# Patient Record
Sex: Male | Born: 1935 | Race: Black or African American | Hispanic: No | Smoking: Never smoker
Health system: Southern US, Community
[De-identification: ages and names within clinical notes are randomized; demographics above are authoritative.]

## PROBLEM LIST (undated history)

## (undated) DIAGNOSIS — F0390 Unspecified dementia without behavioral disturbance: Secondary | ICD-10-CM

## (undated) DIAGNOSIS — D649 Anemia, unspecified: Secondary | ICD-10-CM

## (undated) DIAGNOSIS — E0580 Other thyrotoxicosis without thyrotoxic crisis or storm: Secondary | ICD-10-CM

## (undated) DIAGNOSIS — K219 Gastro-esophageal reflux disease without esophagitis: Secondary | ICD-10-CM

## (undated) DIAGNOSIS — I639 Cerebral infarction, unspecified: Secondary | ICD-10-CM

## (undated) DIAGNOSIS — I2510 Atherosclerotic heart disease of native coronary artery without angina pectoris: Secondary | ICD-10-CM

## (undated) DIAGNOSIS — E782 Mixed hyperlipidemia: Secondary | ICD-10-CM

## (undated) DIAGNOSIS — N189 Chronic kidney disease, unspecified: Secondary | ICD-10-CM

## (undated) DIAGNOSIS — I959 Hypotension, unspecified: Secondary | ICD-10-CM

## (undated) DIAGNOSIS — N186 End stage renal disease: Secondary | ICD-10-CM

## (undated) DIAGNOSIS — F419 Anxiety disorder, unspecified: Secondary | ICD-10-CM

## (undated) DIAGNOSIS — F29 Unspecified psychosis not due to a substance or known physiological condition: Secondary | ICD-10-CM

## (undated) DIAGNOSIS — I10 Essential (primary) hypertension: Secondary | ICD-10-CM

## (undated) DIAGNOSIS — Z862 Personal history of diseases of the blood and blood-forming organs and certain disorders involving the immune mechanism: Secondary | ICD-10-CM

## (undated) DIAGNOSIS — M1990 Unspecified osteoarthritis, unspecified site: Secondary | ICD-10-CM

## (undated) DIAGNOSIS — H269 Unspecified cataract: Secondary | ICD-10-CM

## (undated) DIAGNOSIS — F22 Delusional disorders: Secondary | ICD-10-CM

## (undated) HISTORY — PX: OTHER SURGICAL HISTORY: SHX169

## (undated) HISTORY — DX: Chronic kidney disease, unspecified: N18.9

## (undated) HISTORY — DX: Essential (primary) hypertension: I10

## (undated) HISTORY — PX: FOOT SURGERY: SHX648

## (undated) HISTORY — DX: Personal history of diseases of the blood and blood-forming organs and certain disorders involving the immune mechanism: Z86.2

## (undated) HISTORY — DX: Unspecified dementia, unspecified severity, without behavioral disturbance, psychotic disturbance, mood disturbance, and anxiety: F03.90

## (undated) HISTORY — DX: Atherosclerotic heart disease of native coronary artery without angina pectoris: I25.10

## (undated) HISTORY — DX: Anxiety disorder, unspecified: F41.9

## (undated) HISTORY — DX: Unspecified osteoarthritis, unspecified site: M19.90

## (undated) HISTORY — DX: Gastro-esophageal reflux disease without esophagitis: K21.9

## (undated) HISTORY — DX: Anemia, unspecified: D64.9

## (undated) HISTORY — PX: KNEE SURGERY: SHX244

## (undated) HISTORY — DX: Other thyrotoxicosis without thyrotoxic crisis or storm: E05.80

## (undated) HISTORY — PX: BUNIONECTOMY: SHX129

---

## 2001-09-24 ENCOUNTER — Encounter: Payer: Self-pay | Admitting: Urology

## 2001-09-24 ENCOUNTER — Encounter: Admission: RE | Admit: 2001-09-24 | Discharge: 2001-09-24 | Payer: Self-pay | Admitting: Urology

## 2001-10-02 ENCOUNTER — Encounter: Payer: Self-pay | Admitting: Urology

## 2001-10-02 ENCOUNTER — Ambulatory Visit: Admission: RE | Admit: 2001-10-02 | Discharge: 2001-10-02 | Payer: Self-pay | Admitting: Urology

## 2003-07-10 ENCOUNTER — Encounter (INDEPENDENT_AMBULATORY_CARE_PROVIDER_SITE_OTHER): Payer: Self-pay | Admitting: *Deleted

## 2003-07-11 ENCOUNTER — Inpatient Hospital Stay (HOSPITAL_COMMUNITY): Admission: RE | Admit: 2003-07-11 | Discharge: 2003-07-12 | Payer: Self-pay | Admitting: Urology

## 2004-02-01 ENCOUNTER — Ambulatory Visit (HOSPITAL_COMMUNITY): Admission: RE | Admit: 2004-02-01 | Discharge: 2004-02-01 | Payer: Self-pay | Admitting: Urology

## 2004-02-01 ENCOUNTER — Ambulatory Visit (HOSPITAL_BASED_OUTPATIENT_CLINIC_OR_DEPARTMENT_OTHER): Admission: RE | Admit: 2004-02-01 | Discharge: 2004-02-01 | Payer: Self-pay | Admitting: Urology

## 2008-04-25 ENCOUNTER — Emergency Department: Payer: Self-pay | Admitting: Unknown Physician Specialty

## 2008-04-26 ENCOUNTER — Emergency Department: Payer: Self-pay | Admitting: Emergency Medicine

## 2008-04-27 ENCOUNTER — Emergency Department: Payer: Self-pay | Admitting: Emergency Medicine

## 2008-05-04 ENCOUNTER — Ambulatory Visit: Payer: Self-pay | Admitting: Nephrology

## 2009-07-22 ENCOUNTER — Inpatient Hospital Stay: Payer: Self-pay | Admitting: Internal Medicine

## 2009-08-03 ENCOUNTER — Emergency Department: Payer: Self-pay | Admitting: Emergency Medicine

## 2009-08-28 ENCOUNTER — Ambulatory Visit: Payer: Self-pay | Admitting: Gastroenterology

## 2009-10-09 ENCOUNTER — Ambulatory Visit: Payer: Self-pay | Admitting: Gastroenterology

## 2009-10-18 ENCOUNTER — Ambulatory Visit: Payer: Self-pay | Admitting: Nephrology

## 2009-12-04 ENCOUNTER — Ambulatory Visit: Payer: Self-pay | Admitting: Nephrology

## 2009-12-05 ENCOUNTER — Emergency Department: Payer: Self-pay | Admitting: Unknown Physician Specialty

## 2009-12-07 ENCOUNTER — Emergency Department: Payer: Self-pay | Admitting: Emergency Medicine

## 2010-03-04 ENCOUNTER — Emergency Department: Payer: Self-pay | Admitting: Emergency Medicine

## 2010-03-14 ENCOUNTER — Inpatient Hospital Stay: Payer: Self-pay | Admitting: Internal Medicine

## 2010-03-14 ENCOUNTER — Ambulatory Visit: Payer: Self-pay | Admitting: Cardiovascular Disease

## 2010-03-15 ENCOUNTER — Encounter: Payer: Self-pay | Admitting: Cardiovascular Disease

## 2010-03-15 ENCOUNTER — Encounter: Payer: Self-pay | Admitting: Internal Medicine

## 2010-03-20 ENCOUNTER — Encounter: Payer: Self-pay | Admitting: Internal Medicine

## 2010-03-21 ENCOUNTER — Emergency Department (HOSPITAL_COMMUNITY)
Admission: EM | Admit: 2010-03-21 | Discharge: 2010-03-21 | Payer: Self-pay | Source: Home / Self Care | Admitting: Emergency Medicine

## 2010-04-05 ENCOUNTER — Ambulatory Visit (HOSPITAL_COMMUNITY)
Admission: RE | Admit: 2010-04-05 | Discharge: 2010-04-05 | Payer: Self-pay | Source: Home / Self Care | Admitting: Nephrology

## 2010-04-22 ENCOUNTER — Ambulatory Visit: Payer: Self-pay | Admitting: Surgery

## 2010-05-01 ENCOUNTER — Ambulatory Visit (HOSPITAL_COMMUNITY)
Admission: RE | Admit: 2010-05-01 | Discharge: 2010-05-01 | Payer: Self-pay | Source: Home / Self Care | Attending: Vascular Surgery | Admitting: Vascular Surgery

## 2010-05-01 HISTORY — PX: ARTERIOVENOUS GRAFT PLACEMENT: SUR1029

## 2010-05-02 ENCOUNTER — Encounter: Payer: Self-pay | Admitting: Internal Medicine

## 2010-05-03 ENCOUNTER — Ambulatory Visit: Payer: Self-pay | Admitting: Vascular Surgery

## 2010-05-12 ENCOUNTER — Emergency Department (HOSPITAL_COMMUNITY)
Admission: EM | Admit: 2010-05-12 | Discharge: 2010-05-12 | Payer: Self-pay | Source: Home / Self Care | Admitting: Emergency Medicine

## 2010-05-17 ENCOUNTER — Ambulatory Visit: Payer: Self-pay | Admitting: Vascular Surgery

## 2010-05-22 ENCOUNTER — Encounter: Payer: Self-pay | Admitting: Internal Medicine

## 2010-06-07 ENCOUNTER — Ambulatory Visit
Admission: RE | Admit: 2010-06-07 | Discharge: 2010-06-07 | Payer: Self-pay | Source: Home / Self Care | Attending: Internal Medicine | Admitting: Internal Medicine

## 2010-06-07 ENCOUNTER — Encounter: Payer: Self-pay | Admitting: Internal Medicine

## 2010-06-07 DIAGNOSIS — R079 Chest pain, unspecified: Secondary | ICD-10-CM | POA: Insufficient documentation

## 2010-06-07 DIAGNOSIS — N186 End stage renal disease: Secondary | ICD-10-CM | POA: Insufficient documentation

## 2010-06-07 DIAGNOSIS — E785 Hyperlipidemia, unspecified: Secondary | ICD-10-CM | POA: Insufficient documentation

## 2010-06-07 DIAGNOSIS — I495 Sick sinus syndrome: Secondary | ICD-10-CM | POA: Insufficient documentation

## 2010-06-10 ENCOUNTER — Encounter: Payer: Self-pay | Admitting: Internal Medicine

## 2010-06-14 ENCOUNTER — Ambulatory Visit (HOSPITAL_COMMUNITY)
Admission: RE | Admit: 2010-06-14 | Discharge: 2010-06-14 | Payer: Self-pay | Source: Home / Self Care | Attending: Vascular Surgery | Admitting: Vascular Surgery

## 2010-06-18 ENCOUNTER — Encounter: Payer: Self-pay | Admitting: Internal Medicine

## 2010-06-18 ENCOUNTER — Ambulatory Visit (HOSPITAL_COMMUNITY)
Admission: RE | Admit: 2010-06-18 | Discharge: 2010-06-18 | Payer: Self-pay | Source: Home / Self Care | Attending: Internal Medicine | Admitting: Internal Medicine

## 2010-06-18 ENCOUNTER — Ambulatory Visit: Admission: RE | Admit: 2010-06-18 | Discharge: 2010-06-18 | Payer: Self-pay | Source: Home / Self Care

## 2010-06-18 ENCOUNTER — Ambulatory Visit
Admission: RE | Admit: 2010-06-18 | Discharge: 2010-06-18 | Payer: Self-pay | Source: Home / Self Care | Attending: Internal Medicine | Admitting: Internal Medicine

## 2010-06-20 NOTE — Assessment & Plan Note (Addendum)
Summary: np6/bradycardia/ pt has medicare-mb   Visit Type:  Follow-up  CC:  no complaints.  History of Present Illness: Patient is a 77 year old who was referred for bradycardia.  There are scant records. I do have a note from [facility] on May 06, 2010.  Patieth was due to have a stress myoview.  Did not complete study as refused.  Otherwise, med lists from [facility] show history of acute MI. The patient denis chest pain.  He is not that talkative and mumbles or talks about other things.  He denies SOB.  Denies dizziness.  Appetite is good "I like fruit but don't get enough of it".    Current Medications (verified): 1)  Zocor 20 Mg Tabs (Simvastatin) .Marland Kitchen.. 1 Tablet By Mouth Before Bedtime Daily 2)  Ativan 0.5 Mg Tabs (Lorazepam) .Marland Kitchen.. 1 Tablet Every 4 Hours As Needed 3)  Ativan 1 Mg Tabs (Lorazepam) .Marland Kitchen.. 1 Tablet Every Tuesday Hursday & Saturday Prior To Dialysis 4)  Seroquel 50 Mg Tabs (Quetiapine Fumarate) .... Take 1 Tablet By Mouth Two Times A Day 5)  Norco 5-325 Mg Tabs (Hydrocodone-Acetaminophen) .... Every 6 Hours As Need For Pain 6)  Calcium Acetate 667 Mg Caps (Calcium Acetate (Phos Binder)) .... Two Capsule Three Time A Day With Meals 7)  Miralax .Marland KitchenMarland KitchenMarland Kitchen 17grams of Powder By Mouth Once Daily As Needed 8)  Omeprazole 20 Mg Tbec (Omeprazole) .... Take 1 Tablet By Mouth Once A Day Every Morning 9)  Percocet 5-325 Mg Tabs (Oxycodone-Acetaminophen) .... One Tablet As Needed Every Four Hours For Pain 10)  Renatabs 1 Mg Tabs (B Complex-C-Biotin-E-Fa) .... Once Daily Everyday  Allergies (verified): No Known Drug Allergies  Past History:  Past Medical History: hypertension dementia diabetes   chronic kidney disease on dialysis,  history of myocardial  infarction.  Paranoia Anxiety Dyslpidemia Relux esophagitis  Family History: Unable to attain  Social History: Widowed. Lives in [facility]  Review of Systems       Unable to review as patient  not too communicative.  Vital Signs:  Patient profile:   76 year old male Pulse rate:   43 / minute BP sitting:   130 / 68  (right arm) Cuff size:   regular  Vitals Entered By: Micki Riley CNA (June 07, 2010 1:02 PM)  Physical Exam  Additional Exam:  Patient in NAD in wheelchair HEENT:  Normocephalic, atraumatic  Neck: JVP is normal. No thyromegaly. No bruits.  Lungs: clear to auscultation. No rales no wheezes.  Heart: Regular rate and rhythm. Normal S1, S2. No S3.   No significant murmurs. PMI not displaced.  Abdomen:  Supple, nontender. Normal bowel sounds. No hepatomegaly.  Extremities:   Good distal pulses throughout. No lower extremity edema.  Musculoskeletal :moving all extremities.  Neuro:   Patient answers few questions.    EKG  Procedure date:  06/07/2010  Findings:      NSR.  82 bpm.  First degree AV block.  Frequent PVCs.    Impression & Recommendations:  Problem # 1:  SINUS BRADYCARDIA (ICD-427.81) I am not convinced the patiient is bradycardic.  May indeed have mismeasurement of pulse due to frequent PVCs. I would recomm checking a TSH and BMET.  I would also recomm a holter monitor and an echo. Orders: EKG w/ Interpretation (93000) Echocardiogram (Echo) Holter Monitor (Holter Monitor)  Problem # 2:  CHEST PAIN-UNSPECIFIED (ICD-786.50) Patient denies.  Problem # 3:  RENAL FAILURE, END STAGE (ICD-585.6) On dialysis  Problem # 4:  HYPERLIPIDEMIA-MIXED (ICD-272.4) Continue statin. His updated medication list for this problem includes:    Zocor 20 Mg Tabs (Simvastatin) .Marland Kitchen... 1 tablet by mouth before bedtime daily  Patient Instructions: 1)  Your physician has requested that you have an echocardiogram.  Echocardiography is a painless test that uses sound waves to create images of your heart. It provides your doctor with information about the size and shape of your heart and how well your heart's chambers and valves are working.  This procedure takes  approximately one hour. There are no restrictions for this procedure. 2)  Your physician has recommended that you wear a holter monitor.  Holter monitors are medical devices that record the heart's electrical activity. Doctors most often use these monitors to diagnose arrhythmias. Arrhythmias are problems with the speed or rhythm of the heartbeat. The monitor is a small, portable device. You can wear one while you do your normal daily activities. This is usually used to diagnose what is causing palpitations/syncope (passing out). 3)  Your physician recommends that you return for lab work in: at [facility] at dialysis   Appended Document: np6/bradycardia/ pt has medicare-mb Faxed note to Dr.Patel at the [facility] [phone].

## 2010-07-04 NOTE — Procedures (Signed)
Summary: Summary Report  Summary Report   Imported By: Erle Crocker 06/27/2010 14:32:19  _____________________________________________________________________  External Attachment:    Type:   Image     Comment:   External Document

## 2010-07-10 NOTE — Letter (Signed)
Summary: [facility]   Imported By: Marylou Mccoy 06/27/2010 14:25:55  _____________________________________________________________________  External Attachment:    Type:   Image     Comment:   External Document

## 2010-07-10 NOTE — Letter (Signed)
Summary: [facility]   Imported By: Marylou Mccoy 06/27/2010 14:33:02  _____________________________________________________________________  External Attachment:    Type:   Image     Comment:   External Document

## 2010-07-10 NOTE — Letter (Signed)
Summary: [facility]: [place] Gated Myocardial Stress  [facility]: [place] G[place] Myocardial Stress   Imported By: Earl Many 07/03/2010 17:50:01  _____________________________________________________________________  External Attachment:    Type:   Image     Comment:   External Document

## 2010-07-29 LAB — CBC
HCT: 38.6 % — ABNORMAL LOW (ref 39.0–52.0)
Hemoglobin: 12 g/dL — ABNORMAL LOW (ref 13.0–17.0)
MCHC: 31.1 g/dL (ref 30.0–36.0)
RBC: 4.53 MIL/uL (ref 4.22–5.81)
WBC: 7.6 10*3/uL (ref 4.0–10.5)

## 2010-07-29 LAB — BASIC METABOLIC PANEL
Calcium: 10.6 mg/dL — ABNORMAL HIGH (ref 8.4–10.5)
GFR calc Af Amer: 11 mL/min — ABNORMAL LOW (ref >60)
GFR calc non Af Amer: 9 mL/min — ABNORMAL LOW (ref >60)
Glucose, Bld: 103 mg/dL — ABNORMAL HIGH (ref 70–99)
Potassium: 3.8 mEq/L (ref 3.5–5.1)
Sodium: 136 mEq/L (ref 135–145)

## 2010-07-29 LAB — POCT I-STAT, CHEM 8
BUN: 32 mg/dL — ABNORMAL HIGH (ref 6–23)
Creatinine, Ser: 6 mg/dL — ABNORMAL HIGH (ref 0.4–1.5)
Potassium: 3.7 mEq/L (ref 3.5–5.1)
Sodium: 134 mEq/L — ABNORMAL LOW (ref 135–145)
TCO2: 29 mmol/L (ref 0–100)

## 2010-07-29 LAB — DIFFERENTIAL
Basophils Relative: 0 % (ref 0–1)
Eosinophils Relative: 5 % (ref 0–5)
Lymphocytes Relative: 27 % (ref 12–46)
Monocytes Relative: 17 % — ABNORMAL HIGH (ref 3–12)
Neutro Abs: 3.8 10*3/uL (ref 1.7–7.7)
Neutrophils Relative %: 51 % (ref 43–77)

## 2010-07-29 LAB — MAGNESIUM: Magnesium: 2.4 mg/dL (ref 1.5–2.5)

## 2010-07-29 LAB — POCT CARDIAC MARKERS
CKMB, poc: <1 ng/mL — ABNORMAL LOW (ref 1.0–8.0)
CKMB, poc: <1 ng/mL — ABNORMAL LOW (ref 1.0–8.0)
Myoglobin, poc: 239 ng/mL (ref 12–200)
Troponin i, poc: <0.05 ng/mL (ref 0.00–0.09)

## 2010-07-29 LAB — GLUCOSE, CAPILLARY: Glucose-Capillary: 98 mg/dL (ref 70–99)

## 2010-07-29 LAB — TSH: TSH: 1.592 u[IU]/mL (ref 0.350–4.500)

## 2010-07-29 LAB — T4, FREE: Free T4: 0.91 ng/dL (ref 0.80–1.80)

## 2010-07-30 LAB — POCT I-STAT 4, (NA,K, GLUC, HGB,HCT)
Glucose, Bld: 82 mg/dL (ref 70–99)
HCT: 48 % (ref 39.0–52.0)
Potassium: 4.1 mEq/L (ref 3.5–5.1)

## 2010-07-30 LAB — GLUCOSE, CAPILLARY: Glucose-Capillary: 85 mg/dL (ref 70–99)

## 2010-07-31 LAB — GLUCOSE, CAPILLARY: Glucose-Capillary: 79 mg/dL (ref 70–99)

## 2010-07-31 LAB — URINALYSIS, ROUTINE W REFLEX MICROSCOPIC
Bilirubin Urine: NEGATIVE
Ketones, ur: NEGATIVE mg/dL
Nitrite: NEGATIVE
Protein, ur: 100 mg/dL — AB
Urobilinogen, UA: 0.2 mg/dL (ref 0.0–1.0)

## 2010-07-31 LAB — DIFFERENTIAL
Basophils Absolute: 0 10*3/uL (ref 0.0–0.1)
Basophils Relative: 0 % (ref 0–1)
Eosinophils Absolute: 0.1 10*3/uL (ref 0.0–0.7)
Eosinophils Relative: 1 % (ref 0–5)
Neutrophils Relative %: 65 % (ref 43–77)

## 2010-07-31 LAB — CBC
MCH: 28.2 pg (ref 26.0–34.0)
MCV: 88.2 fL (ref 78.0–100.0)
Platelets: 120 10*3/uL — ABNORMAL LOW (ref 150–400)
RDW: 13.5 % (ref 11.5–15.5)

## 2010-07-31 LAB — BASIC METABOLIC PANEL
BUN: 26 mg/dL — ABNORMAL HIGH (ref 6–23)
CO2: 32 mEq/L (ref 19–32)
Calcium: 10.4 mg/dL (ref 8.4–10.5)
Chloride: 96 mEq/L (ref 96–112)
Creatinine, Ser: 7.09 mg/dL — ABNORMAL HIGH (ref 0.4–1.5)
GFR calc Af Amer: 9 mL/min — ABNORMAL LOW (ref >60)

## 2010-07-31 LAB — PROTIME-INR
INR: 1.05 (ref 0.00–1.49)
Prothrombin Time: 13.9 seconds (ref 11.6–15.2)

## 2010-07-31 LAB — URINE MICROSCOPIC-ADD ON

## 2010-08-24 ENCOUNTER — Other Ambulatory Visit: Payer: Self-pay | Admitting: Nephrology

## 2010-08-24 ENCOUNTER — Ambulatory Visit (HOSPITAL_COMMUNITY)
Admission: RE | Admit: 2010-08-24 | Discharge: 2010-08-24 | Disposition: A | Discharge status: Home / Self Care | Payer: Medicare Other | Source: Ambulatory Visit | Attending: Nephrology | Admitting: Nephrology

## 2010-08-24 DIAGNOSIS — N2581 Secondary hyperparathyroidism of renal origin: Secondary | ICD-10-CM | POA: Insufficient documentation

## 2010-08-24 DIAGNOSIS — D696 Thrombocytopenia, unspecified: Secondary | ICD-10-CM | POA: Insufficient documentation

## 2010-08-24 DIAGNOSIS — I129 Hypertensive chronic kidney disease with stage 1 through stage 4 chronic kidney disease, or unspecified chronic kidney disease: Secondary | ICD-10-CM | POA: Insufficient documentation

## 2010-08-24 DIAGNOSIS — Y832 Surgical operation with anastomosis, bypass or graft as the cause of abnormal reaction of the patient, or of later complication, without mention of misadventure at the time of the procedure: Secondary | ICD-10-CM | POA: Insufficient documentation

## 2010-08-24 DIAGNOSIS — T82868A Thrombosis of vascular prosthetic devices, implants and grafts, initial encounter: Secondary | ICD-10-CM

## 2010-08-24 DIAGNOSIS — I252 Old myocardial infarction: Secondary | ICD-10-CM | POA: Insufficient documentation

## 2010-08-24 DIAGNOSIS — E119 Type 2 diabetes mellitus without complications: Secondary | ICD-10-CM | POA: Insufficient documentation

## 2010-08-24 DIAGNOSIS — T82898A Other specified complication of vascular prosthetic devices, implants and grafts, initial encounter: Secondary | ICD-10-CM | POA: Insufficient documentation

## 2010-08-24 DIAGNOSIS — F0390 Unspecified dementia without behavioral disturbance: Secondary | ICD-10-CM | POA: Insufficient documentation

## 2010-08-24 DIAGNOSIS — N189 Chronic kidney disease, unspecified: Secondary | ICD-10-CM | POA: Insufficient documentation

## 2010-08-24 MED ORDER — IOHEXOL 300 MG/ML  SOLN
100.0000 mL | Freq: Once | INTRAMUSCULAR | Status: AC | PRN
  Administered 2010-08-24: 60 mL via INTRAVENOUS

## 2010-08-26 MED ORDER — IOHEXOL 300 MG/ML  SOLN
100.0000 mL | Freq: Once | INTRAMUSCULAR | Status: AC | PRN
  Administered 2010-08-26: 60 mL via INTRAVENOUS

## 2010-10-01 NOTE — Assessment & Plan Note (Signed)
OFFICE VISIT   [name], [name]  DOB:  1931/11/17                                       04/22/2010  CHART#:[id number]   Patient comes in today for a new access.  He is a 77 year old gentleman  who used to dialyze in [place]; however, he has been transferred over  to [facility].  He has had a left forearm graft placed in  [place].  It recently clotted, was scheduled for thrombolysis;  however, it was discovered that he had stents all the way throughout his  venous system in his upper arm and was therefore not a candidate for  further study.  He is sent to me for new access.  The patient's kidney  disease is secondary to diabetes and hypertension.  He also suffers from  coronary artery disease, having had an acute MI in October 2011.   Review of systems is positive for cataracts as well as arthritis and  joint pain, according to the patient.  All other review of systems are  negative.   PAST MEDICAL HISTORY:  Diabetes, hypertension, coronary artery disease,  secondary hyperparathyroidism, dementia, chronic thrombocytopenia.   The patient has an echocardiogram from 2011 which showed an ejection  fraction of 50% to 55% with mild aortic stenosis, normal LV function,  and mild pulmonary hypertension.   SOCIAL HISTORY:  He is widowed with 7 children.  Does not drink or  smoke.   FAMILY HISTORY:  Negative for premature cardiovascular disease.   PHYSICAL EXAMINATION:  Vital signs:  Heart rate is 80, blood pressure  179/86, respiratory rate 18.  General:  He is in no acute distress.  HEENT:  Within normal limits.  Respirations are nonlabored.  Cardiovascular:  Regular rate and rhythm.  No murmur.  Abdomen:  Soft.  Musculoskeletal without major deformities.  Skin without rash.  The left  arm shows an occluded forearm graft.  There are multiple incisions  around the antecubital crease.  The artery is somewhat redundant and  superficial in the upper  arm near the antecubital crease.   End-stage renal disease, needing new access.  I have recommended  proceeding with a left upper arm graft.  The risks and benefits were  discussed with the patient, including the risk of infection and steal.  We will try to schedule this before the holidays.  It may need to be  done by one of my partners.  The patient is okay with this.     Jorge Ny, MD  Electronically Signed   VWB/MEDQ  D:  04/22/2010  T:  04/23/2010  Job:  [id number]   cc:   [facility]

## 2010-10-01 NOTE — Assessment & Plan Note (Signed)
OFFICE VISIT   [name], [name]  DOB:  12-Apr-1932                                       05/03/2010  CHART#:[id number]   CHIEF COMPLAINT:  Swelling and redness 2 days status post left upper arm  AV graft.   HISTORY OF PRESENT ILLNESS:  The patient is a 77 year old gentleman who  had a left upper arm brachial to axillary AV graft placed by Dr. Hart Rochester  on 05/01/2010.  He was sent here for evaluation of some redness around  the antecubital incision.  He also has some swelling in the area as  well.  The patient states that the area around the antecubital incision  is somewhat tender as to be expected after 2 days from surgery.  He  otherwise has no symptoms of steal.   PHYSICAL EXAM:  This is an elderly gentleman in no acute distress.  His  heart rate 78, sats were 97, his respiratory rate was 10.  The left  upper extremity he had some mild swelling in the area of the antecubital  changes and normal bruising in the area and postop changes.  He had a  positive radial pulse.  He had good motion of his hand.  He had good  thrill and bruit in the upper arm AV graft.   ASSESSMENT:  Normal postop bruising 2 days status post upper arm AV  graft.   PLAN:  To do ice packs for comfort.  Encourage the patient to move the  arm.  Will follow him in 2 weeks to check the graft again and to see if  there are any issues with bruising.  There is no sign of infection at  this time.   Della Goo, PA-C   Leonides Sake, MD  Electronically Signed   RR/MEDQ  D:  05/03/2010  T:  05/03/2010  Job:  [id number]

## 2010-10-01 NOTE — Assessment & Plan Note (Signed)
OFFICE VISIT   AUTHOR, HATLESTAD  DOB:  February 06, 1932                                       05/17/2010  CHART#:[id number]   Postop office note.   The patient is a 77 year old gentleman who had insertion of a left upper  arm brachial axillary vein AV Gore-Tex graft done by Dr. Hart Rochester on  05/01/2010.  He comes in today for followup.  He has had no issues with  steal.  He has no numbness or tingling in his hand.  He can use his left  hand without difficulty.  He is here for followup.  He said he did have  some drainage initially from the antecubital wound but this has  resolved.  He has no pain in the site of the incisions.   PHYSICAL EXAM:  This is an elderly gentleman in no acute distress.  His  heart rate was 37 to 40.  His sats were 97%.  His respiratory rate was  12.  He has a positive radial pulse on the left upper extremity.  All  the wounds were healing well in both the axilla and the antecubital  space.  He had a good thrill and bruit in the left upper extremity AV  Gore-Tex graft.   ASSESSMENT:  1. He has a good thrill and bruit in left upper arm AV graft which      they may begin to use after January 14.  2. Bradycardia.  The patient is on Lopressor.   PLAN:  The patient should have his Lopressor held for a heart rate less  than 50 and the person that accompanied him from the nursing home was  given instructions to that affect as well as the patient was given  written instructions which were sent with him.   Della Goo, PA-C   Fransisco Hertz, MD  Electronically Signed   RR/MEDQ  D:  05/17/2010  T:  05/17/2010  Job:  [id number]   cc:   [facility]

## 2010-10-03 ENCOUNTER — Ambulatory Visit (HOSPITAL_COMMUNITY)
Admission: RE | Admit: 2010-10-03 | Discharge: 2010-10-03 | Disposition: A | Discharge status: Home / Self Care | Payer: Medicare Other | Source: Ambulatory Visit | Attending: Nephrology | Admitting: Nephrology

## 2010-10-03 ENCOUNTER — Other Ambulatory Visit (HOSPITAL_COMMUNITY): Payer: Self-pay | Admitting: Nephrology

## 2010-10-03 DIAGNOSIS — T8249XA Other complication of vascular dialysis catheter, initial encounter: Secondary | ICD-10-CM

## 2010-10-03 DIAGNOSIS — N186 End stage renal disease: Secondary | ICD-10-CM | POA: Insufficient documentation

## 2010-10-03 DIAGNOSIS — Y832 Surgical operation with anastomosis, bypass or graft as the cause of abnormal reaction of the patient, or of later complication, without mention of misadventure at the time of the procedure: Secondary | ICD-10-CM | POA: Insufficient documentation

## 2010-10-03 DIAGNOSIS — T82898A Other specified complication of vascular prosthetic devices, implants and grafts, initial encounter: Secondary | ICD-10-CM | POA: Insufficient documentation

## 2010-10-03 LAB — POTASSIUM: Potassium: 5.2 mEq/L — ABNORMAL HIGH (ref 3.5–5.1)

## 2010-10-03 MED ORDER — IOHEXOL 300 MG/ML  SOLN: 30.0000 mL | Freq: Once | INTRAMUSCULAR | Status: AC | PRN

## 2010-10-04 NOTE — Op Note (Signed)
NAME:  [name], [name]                         ACCOUNT NO.:  [id number]   MEDICAL RECORD NO.:  [id number]                   PATIENT TYPE:  AMB   LOCATION:  [facility]                                 FACILITY:  [facility]   PHYSICIAN:  Sigmund I. Patsi Sears, M.D.         DATE OF BIRTH:  04-25-1932   DATE OF PROCEDURE:  02/01/2004  DATE OF DISCHARGE:                                 OPERATIVE REPORT   PREOPERATIVE DIAGNOSIS:  Urethral stricture.   POSTOPERATIVE DIAGNOSIS:  Urethral stricture.   PROCEDURES PERFORMED:  1.  Cystoscopy.  2.  Urethral dilation using Hayman dilators.  3.  Collins knife incision of urethral stricture.   SURGEON:  Jethro Bolus, MD   RESIDENT SURGEON:  Thyra Breed, MD   ANESTHESIA:  LMA.   DRAINS:  None.   COMPLICATIONS:  None.   INDICATIONS FOR PROCEDURE:  [name] is a 77 year old male with a history  of a bulbar urethral stricture as a complication of a transurethral  resection of the prostate in February 2005.  After the patient's procedure,  he began to have irritative voiding symptoms such as frequency, urgency, as  well as incomplete voiding.  Office cystoscopy showed a significant  stricture in the area of the bulbar urethra.  She is brought to the  operating room today to undergo urethral dilation.  He understands the  risks, benefits, and alternatives of the procedure and is willing to  proceed.   PROCEDURE IN DETAIL:  Following identification by his arm bracelet, the  patient was brought to the operating room and placed in the supine position.  Here, he received successful induction of LMA anesthesia and received  preoperative IV antibiotics.  He was then moved to the dorsal lithotomy  position.  His perineum and genitalia were prepped and draped in the usual  sterile fashion.  We initially placed a 12-degree cystoscopic lens through a  22.5 [place] sheath which revealed a normal-appearing anterior urethra.  However, in the bulbar urethra,  there was evidence of previous false  passages as well as a significant stricture of only 6 [place] passage into  the prostatic urethra.  Using the cystoscope, we placed a 0.038 guidewire  through the area of stricture and into the bladder without difficulty.  The  cystoscope was then removed.  Using the [facility] dilators in a successive  fashion, he was easily dilated to 30 [place].  We then reinserted the  cystoscope which showed evidence that the stricture had been largely  ablated.  The stricture was rather soft and thin.  However, there continued  to be some fibrotic bands in a circumferential fashion near the bladder  neck.  Therefore, the [employer] was then used through the resectoscope  sheath to incise at the 12 o'clock position with care taken not to travel  deep into the urethra.  In fact, this opened up the entire area of stricture  nicely.  It appeared the patient  had a second stricture at the bladder neck.  At any rate, the resectoscope passed easily in and out.  There was no  evidence of significant bleeding from the procedure.  The bladder was then  drained in its entirety.  We elected not to leave a Foley catheter given the  excellent result and the lack of significant hematuria.  The patient  tolerated the procedure well, and there were no complications.  Please note  that Dr. Jethro Bolus was present and participated in the entire  procedure, as he was the responsible surgeon.   DISPOSITION:  After awaking from general anesthesia, the patient was  transported to the postanesthesia care unit in stable condition.  Here, he  will be discharged to home after a trial of voiding.     Thyra Breed, MD                            Sigmund I. Patsi Sears, M.D.    EG/MEDQ  D:  02/01/2004  T:  02/01/2004  Job:  [id number]

## 2010-10-04 NOTE — Op Note (Signed)
NAME:  [name], [name]                         ACCOUNT NO.:  [id number]   MEDICAL RECORD NO.:  [id number]                   PATIENT TYPE:  AMB   LOCATION:  DAY                                  [facility]ACILITY[facility]   PHYSICIAN:  Sigmund I. Patsi Sears, M.D.         DATE OF BIRTH:  1932/02/25   DATE OF PROCEDURE:  DATE OF DISCHARGE:                                 OPERATIVE REPORT   PREOPERATIVE DIAGNOSIS:  Bladder outlet obstruction.   POSTOPERATIVE DIAGNOSIS:  Bladder outlet obstruction.   OPERATION/PROCEDURE:  1. Cystourethroscopy.  2. Placement of suprapubic catheter.  3. Transurethral resection of the prostate.   SURGEON:  Sigmund I. Patsi Sears, M.D.   ANESTHESIA:  General LMA.   PREPARATION:  After appropriate preanesthesia, the patient was brought to  the operating room and placed on the operating table in the dorsal supine  position where general LMA anesthesia was introduced.  He was then re-placed  in the dorsal lithotomy position where the pubis was prepped with Betadine  solution and draped in the usual fashion.   REVIEW OF HISTORY:  [name] is a 77 year old male with a history of gross  hematuria, erectile dysfunction, and bladder outlet symptoms __________ at  23/7 despite medical therapy.  He has a quality-of-life index of 4/6, and a  PVR of 171.5 mL.  The patient is now for TURP and suprapubic tube placement.   DESCRIPTION OF PROCEDURE:  Cystoscopy was accomplished and shows bilobar BPH  of the large elevated bladder neck.  Trabeculation is identified but there  are no cellules, no bladder tumor or stone.  Under direct vision, with the  patient in the lithotomy position and in the Trendelenburg position, the  suprapubic area is prepped and draped in usual fashion, and after filling  the bladder with glycine, an area two to three fingerbreadths above the  pubic tubercles was selected in the midline, anesthetized with 0.5% Marcaine  solution plain, and with  aspiration showing the area of the bladder, the  Microvasive suprapubic catheter was placed without difficulty.  Cystoscopy  revealed the catheter to be in excellent position.  The bladder neck then  was incised and transurethral resection of the median lobe was accomplished.  The orifices were located laterally and clear efflux was seen from both  orifices.  The smaller lateral lobes were resected without difficulty.  Minimal bleeding was noted and individual bleeding sites were  electrocoagulated.  A 24 Ainsworth catheter was placed in the bladder.  It  is noted at the beginning of the case that the patient needed urethral  dilation size 30 because of apparent medial stenosis.   Following resection and placement of the Ainsworth catheter, it was felt  that traction would be needed only on a p.r.n. basis.  Suprapubic tube was  placed in the on position, and the patient was awakened and taken to the  recovery room in good condition.  He had previously undergone  placement of a  B&O suppository.                                               Sigmund I. Patsi Sears, M.D.    SIT/MEDQ  D:  07/10/2003  T:  07/10/2003  Job:  [id number]

## 2010-11-07 ENCOUNTER — Ambulatory Visit (HOSPITAL_COMMUNITY): Payer: Medicare Other

## 2010-11-07 ENCOUNTER — Ambulatory Visit (HOSPITAL_COMMUNITY)
Admission: RE | Admit: 2010-11-07 | Discharge: 2010-11-07 | Disposition: A | Discharge status: Home / Self Care | Payer: Medicare Other | Source: Ambulatory Visit | Attending: Surgery | Admitting: Surgery

## 2010-11-07 DIAGNOSIS — E119 Type 2 diabetes mellitus without complications: Secondary | ICD-10-CM | POA: Insufficient documentation

## 2010-11-07 DIAGNOSIS — Z01812 Encounter for preprocedural laboratory examination: Secondary | ICD-10-CM | POA: Insufficient documentation

## 2010-11-07 DIAGNOSIS — Z79899 Other long term (current) drug therapy: Secondary | ICD-10-CM | POA: Insufficient documentation

## 2010-11-07 DIAGNOSIS — Z992 Dependence on renal dialysis: Secondary | ICD-10-CM | POA: Insufficient documentation

## 2010-11-07 DIAGNOSIS — I120 Hypertensive chronic kidney disease with stage 5 chronic kidney disease or end stage renal disease: Secondary | ICD-10-CM

## 2010-11-07 DIAGNOSIS — Z01818 Encounter for other preprocedural examination: Secondary | ICD-10-CM | POA: Insufficient documentation

## 2010-11-07 DIAGNOSIS — N186 End stage renal disease: Secondary | ICD-10-CM

## 2010-11-07 DIAGNOSIS — Y832 Surgical operation with anastomosis, bypass or graft as the cause of abnormal reaction of the patient, or of later complication, without mention of misadventure at the time of the procedure: Secondary | ICD-10-CM | POA: Insufficient documentation

## 2010-11-07 DIAGNOSIS — I2510 Atherosclerotic heart disease of native coronary artery without angina pectoris: Secondary | ICD-10-CM | POA: Insufficient documentation

## 2010-11-07 DIAGNOSIS — T82898A Other specified complication of vascular prosthetic devices, implants and grafts, initial encounter: Secondary | ICD-10-CM | POA: Insufficient documentation

## 2010-11-07 DIAGNOSIS — I252 Old myocardial infarction: Secondary | ICD-10-CM | POA: Insufficient documentation

## 2010-11-07 HISTORY — PX: THROMBECTOMY / ARTERIOVENOUS GRAFT REVISION: SUR1351

## 2010-11-07 LAB — POCT I-STAT 4, (NA,K, GLUC, HGB,HCT)
HCT: 39 % (ref 39.0–52.0)
Potassium: 6 mEq/L — ABNORMAL HIGH (ref 3.5–5.1)
Sodium: 134 mEq/L — ABNORMAL LOW (ref 135–145)

## 2010-11-11 NOTE — Op Note (Signed)
NAMEISAURO, SKELLEY NO.:  [id number]  MEDICAL RECORD NO.:  [id number]  LOCATION:  [facility]                         FACILITY:  [facility]  PHYSICIAN:  Fransisco Hertz, MD       DATE OF BIRTH:  November 28, 1931  DATE OF PROCEDURE:  11/07/2010 DATE OF DISCHARGE:                              OPERATIVE REPORT   PROCEDURES: 1&2. Thrombectomy and Revision of left upper arm arteriovenous graft. 3. Mechanical dilation of a sclerotic valve.  PREOPERATIVE DIAGNOSIS:  Thrombosed left upper arm arteriovenous graft.  POSTOPERATIVE DIAGNOSIS:  Thrombosed left upper arm arteriovenous graft.  SURGEON:  Fransisco Hertz, MD  ASSISTANT:  Newton Pigg, PA  ANESTHESIA:  General.  FINDINGS IN THIS CASE: 1. Thrombus extracted from the graft. 2. Stenosis located in the chest based on the resistance during the     thrombectomy, possible sclerotic valve. 3. There is a palpable left radial pulse and thrill at the end of this     case. SPECIMENS:  None.  ESTIMATED BLOOD LOSS:  100 mL.  INDICATIONS:  This is a 77 year old gentleman with a left upper arm arteriovenous graft that recently clotted.  He notes previously no problems of this graft.  The patient's history is somewhat unreliable as he has a known case of dementia.  He was not able to give consent for this case,  thus the power of attorney gave consent in this case and is aware that the risk of this case included but are not limited to bleeding, infection, possible inability to complete this thrombectomy, possible need for placement of tunneled dialysis catheter, and possible need for additional procedures in the future.  DESCRIPTION OF PROCEDURE:  After full informed written consent was obtained from the patient's power of attorney, he was brought back to the operating, and placed supine upon the operating table.  He was given IV antibiotics prior to induction.  After obtaining adequate anesthesia, he was prepped and draped in  standard fashion for the left arm access procedure.  I turned my attention to his axilla where I injected a total about 25 mL of 1% lidocaine with epinephrine to obtain some local anesthetic care as while the patient was under general anesthetic was still reactive.  I made then a longitudinal incision through the previous incision, extending more proximally and dissected down to the graft.  The graft was in an end-to-end configuration with the vein.  The vein was dissected out.  I dissected the vein out more proximally, it looked to be externally at least 5 mm in diameter and good caliber. At this point, I made a graftotomy with an 11 blade and then passed a 4 Fogarty down the arterial arm, extracting clot on 3 different passes until I got good pulsatile bleeding throughout.  I then injected heparinized saline and clamped this graft.  Note, I had given 3000 heparin intravenously prior to opening up the graft.  At this point, then I opened up the proximal portion of this graft longitudinally, opening into the vein.  The vein had some slight sclerosis, but was not evident exactly why this graft had clotted.  I then passed 4 Fogarty proximally and encountered  1 significant resistance about 15 cm proximal to the venotomy, this would place the lesion within the chest.  I was able to, however, drag this balloon through this area and after 3 passes actually to this area, no longer gave resistance, however, more proximally about 8 cm proximal to the venotomy which would place it also within the chest.  There was a recurrent area of resistance.  I serially passed dilator starting from a 2 mm all the way up to a 5 mm and based on the resistance, it felt like a sclerotic valve.  After doing this, I was able to get excellent backbleeding from the vein.  I injected heparinized saline and clamped the vein here.  I then took down the extra segment of graft and resected the vein slightly to get a  fresh vein edge.  I then fashioned the vein for an end-to-end anastomosis.  I obtained a short piece of 7 mm Gore-Tex graft.  I then spatulated the proximal end of this graft and sewed in end-to-end fashion with a 5-0 Prolene to the vein.  At this point, I then spatulated the graft to meet the dimensions of the original graft and sewed these 2 grafts together with a 5-0 Prolene.  Prior to completing this anastomosis, I passed the Fogarty above proximally into the vein.  There was excellent backbleeding and I also into the artery had excellent antegrade bleeding.  There was no clot from either passage of the Fogarty.  I then injected heparinized saline into this anastomosis and completed the anastomosis in the usual fashion.  I placed thrombin and Gelfoam along the suture lines.  At this point, I released all clamps.  There was excellent thrill and pulse within this graft.  Distally, there was a palpable radial pulse at this point.  At this point, I irrigated out the surgical wound.  There was no more active bleeding.  The subcutaneous tissue was then reapproximated with running stitch of 3-0 Vicryl.  The skin was then reapproximated with running subcuticular 4-0 Monocryl.  The skin closure was then reinforced with Dermabond.  The patient was allowed to awaken without any problems.  The patient's condition was stable. Complications were none.     Fransisco Hertz, MD     BLC/MEDQ  D:  11/07/2010  T:  11/08/2010  Job:  [id number]  Electronically Signed by Leonides Sake MD on 11/11/2010 07:39:32 AM

## 2010-11-27 NOTE — Progress Notes (Signed)
[facility] [place]  Postoperative Access Visit  History of Present Illness  [name] is a 77 y.o. year old male who presents for postoperative follow-up for: T&R LUA AVG (Date: 11/07/2010).  The patient's wounds are healing.  The patient notes no steal symptoms.  The patient is able to complete their activities of daily living.  The patient's current symptoms are: tenderness in left axilla. Pt has had no drainage, fever or chills.  Physical Examination  Filed Vitals:   11/29/10 1446  BP: 170/80  Pulse: 70  Resp: 12   LUE: Incision is c/d/i and healing, skin feels warm, hand grip is 5/5, sensation in digits is intact  Medical Decision Making  [name] is a 77 y.o. year old male who presents s/p successfulT&R LUA AVG.  The patient's access is ready for use   Thank you for allowing [place] to participate in this patient's care.  Leonides Sake, MD [facility] [place] Office: [phone] Pager: [phone]

## 2010-11-28 ENCOUNTER — Encounter: Payer: Self-pay | Admitting: Vascular Surgery

## 2010-11-29 ENCOUNTER — Encounter: Payer: Self-pay | Admitting: Vascular Surgery

## 2010-11-29 ENCOUNTER — Ambulatory Visit (INDEPENDENT_AMBULATORY_CARE_PROVIDER_SITE_OTHER): Payer: Medicare Other | Admitting: Vascular Surgery

## 2010-11-29 VITALS — BP 170/80 | HR 70 | Resp 12

## 2010-11-29 DIAGNOSIS — N186 End stage renal disease: Secondary | ICD-10-CM

## 2010-11-29 DIAGNOSIS — Z992 Dependence on renal dialysis: Secondary | ICD-10-CM

## 2010-12-05 ENCOUNTER — Ambulatory Visit (HOSPITAL_COMMUNITY): Payer: Medicare Other

## 2010-12-05 ENCOUNTER — Ambulatory Visit (HOSPITAL_COMMUNITY)
Admission: AD | Admit: 2010-12-05 | Discharge: 2010-12-05 | Disposition: A | Discharge status: Home / Self Care | Payer: Medicare Other | Source: Ambulatory Visit | Attending: Vascular Surgery | Admitting: Vascular Surgery

## 2010-12-05 ENCOUNTER — Telehealth: Payer: Self-pay | Admitting: Internal Medicine

## 2010-12-05 DIAGNOSIS — I120 Hypertensive chronic kidney disease with stage 5 chronic kidney disease or end stage renal disease: Secondary | ICD-10-CM

## 2010-12-05 DIAGNOSIS — N186 End stage renal disease: Secondary | ICD-10-CM | POA: Insufficient documentation

## 2010-12-05 DIAGNOSIS — Z01812 Encounter for preprocedural laboratory examination: Secondary | ICD-10-CM | POA: Insufficient documentation

## 2010-12-05 DIAGNOSIS — Z992 Dependence on renal dialysis: Secondary | ICD-10-CM | POA: Insufficient documentation

## 2010-12-05 DIAGNOSIS — Y832 Surgical operation with anastomosis, bypass or graft as the cause of abnormal reaction of the patient, or of later complication, without mention of misadventure at the time of the procedure: Secondary | ICD-10-CM | POA: Insufficient documentation

## 2010-12-05 DIAGNOSIS — T82898A Other specified complication of vascular prosthetic devices, implants and grafts, initial encounter: Secondary | ICD-10-CM | POA: Insufficient documentation

## 2010-12-05 DIAGNOSIS — I252 Old myocardial infarction: Secondary | ICD-10-CM | POA: Insufficient documentation

## 2010-12-05 DIAGNOSIS — F0390 Unspecified dementia without behavioral disturbance: Secondary | ICD-10-CM | POA: Insufficient documentation

## 2010-12-05 DIAGNOSIS — I2510 Atherosclerotic heart disease of native coronary artery without angina pectoris: Secondary | ICD-10-CM | POA: Insufficient documentation

## 2010-12-05 HISTORY — PX: OTHER SURGICAL HISTORY: SHX169

## 2010-12-05 LAB — SURGICAL PCR SCREEN: Staphylococcus aureus: NEGATIVE

## 2010-12-05 LAB — GLUCOSE, CAPILLARY
Glucose-Capillary: 78 mg/dL (ref 70–99)
Glucose-Capillary: 77 mg/dL (ref 70–99)
Glucose-Capillary: 84 mg/dL (ref 70–99)

## 2010-12-05 LAB — POCT I-STAT 4, (NA,K, GLUC, HGB,HCT): Sodium: 138 mEq/L (ref 135–145)

## 2010-12-05 NOTE — Telephone Encounter (Signed)
Faxed LOV, EKG, Stress & Echo. To Thurston Hole at [facility] Short Stay ([phone]).

## 2010-12-20 ENCOUNTER — Ambulatory Visit (INDEPENDENT_AMBULATORY_CARE_PROVIDER_SITE_OTHER): Payer: Medicare Other | Admitting: Vascular Surgery

## 2010-12-20 ENCOUNTER — Encounter: Payer: Self-pay | Admitting: Vascular Surgery

## 2010-12-20 VITALS — BP 94/58 | HR 97 | Resp 18 | Ht 72.0 in | Wt 195.0 lb

## 2010-12-20 DIAGNOSIS — N186 End stage renal disease: Secondary | ICD-10-CM

## 2010-12-20 DIAGNOSIS — T82898A Other specified complication of vascular prosthetic devices, implants and grafts, initial encounter: Secondary | ICD-10-CM

## 2010-12-20 NOTE — Progress Notes (Signed)
[facility] [place]  Established Dialysis Access  History of Present Illness  [name] is a 77 y.o. male who presents for re-evaluation for permanent access.  The patient is right hand dominant.  Previous access procedures have been completed in the left arm.  The patient's complication from previous access procedures include: thrombosis.  Past Medical History, Past Surgical History, Social History, Family History, Medications, Allergies, and Review of Systems are unchanged from previous visit.  Physical Examination  Filed Vitals:   12/20/10 1619  BP: 94/58  Pulse: 97  Resp: 18    General: A&O x 3, WDWN, elderly, confused  Pulmonary: Sym exp, good air movt, CTAB, no rales, rhonchi, & wheezing  Cardiac: RRR, Nl S1, S2, no Murmurs, rubs or gallops  Gastrointestinal: soft, NTND, -G/R, - HSM, - masses, - CVAT B  Musculoskeletal: M/S 5/5 in upper extremities, Extremities without ischemic changes, B hand grip 5/5,  Neurologic: Pain and light touch intact in extremities, Motor exam as listed above  Non-Invasive Vascular Imaging  Vein Mapping ordered  Medical Decision Making  [name] is a 77 y.o. male who presents with ESRD requiring hemodialysis.  Pt will need new access in R arm.  R arm vein mapping to determine next access surgery  Follow-up in 2-4 weeks after vein mapping completed  Leonides Sake, MD [facility] [place] Office: [phone] Pager: [phone]

## 2010-12-20 NOTE — Progress Notes (Signed)
ESRD  EVAL FOR ACCESS

## 2010-12-27 ENCOUNTER — Ambulatory Visit: Payer: Medicare Other | Admitting: Vascular Surgery

## 2011-01-15 NOTE — Progress Notes (Signed)
[facility] [place]  Established Dialysis Access  History of Present Illness  [name] is a 77 y.o. male who presents for re-evaluation for permanent access.  The patient returns for vein mapping today.  He current dialyzes from L IJ TDC.   Past Medical History  Diagnosis Date  . Diabetes mellitus   . Hypertension   . Chronic kidney disease   . Arthritis   . Myocardial infarction 02/2010  . Cataract   . CAD (coronary artery disease)   . Secondary hyperthyroidism   . Dementia   . History of thrombocytopenia   . GERD (gastroesophageal reflux disease)   . Anxiety     paranoia    Past Surgical History  Procedure Date  . Arteriovenous graft placement 05/01/10    Left upper arm brachial artery to axillary vein AVG  . Thrombectomy / arteriovenous graft revision 11/07/10    Left upper arm AVG  by Dr. Imogene Burn  . Diatek catheter insertion 12/05/10    Left side    History   Social History  . Marital Status: Single    Spouse Name: N/A    Number of Children: N/A  . Years of Education: N/A   Occupational History  . Not on file.   Social History Main Topics  . Smoking status: Former Smoker    Quit date: 12/19/1997  . Smokeless tobacco: Never Used  . Alcohol Use: No  . Drug Use: No  . Sexually Active: Not on file   Other Topics Concern  . Not on file   Social History Narrative  . No narrative on file    History reviewed. No pertinent family history.  Current outpatient prescriptions:amLODipine (NORVASC) 10 MG tablet, Take 10 mg by mouth daily.  , Disp: , Rfl: ;  aspirin 325 MG tablet, Take 325 mg by mouth daily.  , Disp: , Rfl: ;  b complex-vitamin c-folic acid (NEPHRO-VITE) 0.8 MG TABS, Take 0.8 mg by mouth at bedtime.  , Disp: , Rfl: ;  calcium acetate (PHOSLO) 667 MG capsule, Take 667 mg by mouth 3 (three) times daily with meals.  , Disp: , Rfl:  docusate sodium (COLACE) 100 MG capsule, Take 100 mg by mouth 2 (two) times daily.  , Disp: ,  Rfl: ;  LORazepam (ATIVAN) 0.5 MG tablet, Take 0.5 mg by mouth every 8 (eight) hours.  , Disp: , Rfl: ;  minoxidil (LONITEN) 10 MG tablet, Take 10 mg by mouth. Per Dialysis instruction , Disp: , Rfl: ;  Nutritional Supplements (NEPRO PO), Take by mouth 3 (three) times daily.  , Disp: , Rfl:  omeprazole (PRILOSEC) 20 MG capsule, Take 20 mg by mouth daily.  , Disp: , Rfl: ;  Oxycodone HCl 10 MG TABS, Take by mouth as needed.  , Disp: , Rfl: ;  polyethylene glycol (MIRALAX / GLYCOLAX) packet, Take 17 g by mouth daily.  , Disp: , Rfl: ;  QUEtiapine (SEROQUEL) 50 MG tablet, Take 50 mg by mouth 2 (two) times daily.  , Disp: , Rfl: ;  sevelamer (RENAGEL) 800 MG tablet, Take 800 mg by mouth 3 (three) times daily with meals.  , Disp: , Rfl:  simvastatin (ZOCOR) 20 MG tablet, Take 20 mg by mouth at bedtime.  , Disp: , Rfl: ;  divalproex (DEPAKOTE) 250 MG EC tablet, Take 250 mg by mouth daily.  , Disp: , Rfl: ;  haloperidol (HALDOL) 1 MG tablet, Take 1 mg by mouth 4 (four) times  daily.  , Disp: , Rfl: ;  HYDROcodone-acetaminophen (NORCO) 5-325 MG per tablet, Take 1 tablet by mouth every 6 (six) hours as needed.  , Disp: , Rfl:  lisinopril (PRINIVIL,ZESTRIL) 40 MG tablet, Take 40 mg by mouth daily.  , Disp: , Rfl: ;  metoprolol tartrate (LOPRESSOR) 25 MG tablet, Take 25 mg by mouth. Per Dialysis instruction  , Disp: , Rfl:   Allergies as of 01/17/2011  . (Not on File)   Review of Systems As noted above, otherwise negative  Physical Examination  Filed Vitals:   01/17/11 1525  BP: 100/60  Pulse: 90  Resp: 16    General: A&O x 3, WDWN, elderly, debilitated  Vascular: Right radial pulse is palpable, right brachial pulse is palpable.  Musculoskeletal: M/S 5/5 throughout , Extremities without  ischemic changes   Neurologic: Pain and light touch intact in upper extremities , Motor exam as listed above  Non-Invasive Vascular Imaging  Vein Mapping  (Date: 01/17/11):   R arm: acceptable vein conduits  include basilic vein  Medical Decision Making  [name] is a 77 y.o. male who presents with ESRD requiring hemodialysis.   Based on vein mapping and examination, this patient's permanent access options include: R stage BVT  I had an extensive discussion with this patient in regards to the nature of access surgery, including risk, benefits, and alternatives.    The patient is aware that the risks of access surgery include but are not limited to: bleeding, infection, steal syndrome, nerve damage, ischemic monomelic neuropathy, failure of access to mature, and possible need for additional access procedures in the future.  The patient has agreed to proceed with the above procedure which will be scheduled 5 SEP 12.  Leonides Sake, MD [facility] [place] Office: [phone] Pager: [phone]

## 2011-01-16 ENCOUNTER — Encounter: Payer: Self-pay | Admitting: Vascular Surgery

## 2011-01-16 NOTE — Op Note (Signed)
  [name], [name]               ACCOUNT NO.:  [id number]  MEDICAL RECORD NO.:  [id number]  LOCATION:  [facility]                         FACILITY:  [facility]  PHYSICIAN:  VDurene Cal IV, MDDATE OF BIRTH:  [birth year]  DATE OF PROCEDURE: DATE OF DISCHARGE:                              OPERATIVE REPORT   PREOPERATIVE DIAGNOSIS:  Occluded graft, end-stage renal disease.  POSTOPERATIVE DIAGNOSIS:  Occluded graft, end-stage renal disease.  PROCEDURE PERFORMED: 1. Ultrasound-guided access left internal jugular vein. 2. Left internal jugular vein Diatek catheter placement, 28 cm.  ANESTHESIA:  MAC.  COMPLICATIONS:  None.  INDICATIONS:  This is a 77 year old gentleman with end-stage renal disease who is not a candidate for additional revision.  He comes in today for catheter.  DESCRIPTION OF PROCEDURE:  The patient was identified in the holding area, taken to room #6, placed supine on the table.  MAC anesthesia was administered.  The patient was prepped and draped in usual fashion. Time-out was called.  Ultrasound was used to evaluate the left internal jugular vein.  The patient's neck was frozen, it was difficult to get a good angle approaching the internal jugular vein; however, it was compressible and patent.  An 11 blade was used to make a skin incision in the neck and then the ultrasound was used to access the left internal jugular vein under ultrasound guidance.  Wire was then advanced into the inferior vena cava.  Sequential dilators were used to dilate subcutaneous tract and a peel-away sheath was placed.  A 28-cm Diatek catheter was loaded over the wire and advanced into the sheath.  Peel- away sheath was then withdrawn along the wire.  Because of the patient's inability to move his neck and his dilated cardiac silhouette and leaving the catheter deeper so that it would not withdraw.  At this point, skin exit site was selected and with 11 blade subcutaneous tunnel was  created which was then dilated.  Catheter was then pulled through the tunnel and the cuff situated at the skin exit site.  Both ports flushed and aspirated without difficulty.  The catheter was then sunk, positioned with a 3-0 nylon.  Skin incision in the neck was closed with a 4-0 Vicryl.  Dermabond was placed.  Both ports were flushed and aspirated without difficulty.  Catheters was filled with appropriate volumes of heparin.  The patient tolerated the procedure well.  There were no complications.     Jorge Ny, MD     VWB/MEDQ  D:  12/05/2010  T:  12/06/2010  Job:  [id number]  Electronically Signed by Arelia Longest IV MD on 01/15/2011 11:58:26 PM

## 2011-01-17 ENCOUNTER — Encounter: Payer: Self-pay | Admitting: *Deleted

## 2011-01-17 ENCOUNTER — Encounter: Payer: Self-pay | Admitting: Vascular Surgery

## 2011-01-17 ENCOUNTER — Ambulatory Visit (INDEPENDENT_AMBULATORY_CARE_PROVIDER_SITE_OTHER): Payer: Medicare Other

## 2011-01-17 ENCOUNTER — Ambulatory Visit (INDEPENDENT_AMBULATORY_CARE_PROVIDER_SITE_OTHER): Payer: Medicare Other | Admitting: Vascular Surgery

## 2011-01-17 VITALS — BP 100/60 | HR 90 | Resp 16 | Ht 71.0 in | Wt 170.0 lb

## 2011-01-17 DIAGNOSIS — N186 End stage renal disease: Secondary | ICD-10-CM

## 2011-01-17 DIAGNOSIS — Z01810 Encounter for preprocedural cardiovascular examination: Secondary | ICD-10-CM

## 2011-01-22 ENCOUNTER — Ambulatory Visit (HOSPITAL_COMMUNITY)
Admission: RE | Admit: 2011-01-22 | Discharge: 2011-01-22 | Disposition: A | Discharge status: Home / Self Care | Payer: Medicare Other | Source: Ambulatory Visit | Attending: Vascular Surgery | Admitting: Vascular Surgery

## 2011-01-22 DIAGNOSIS — F0390 Unspecified dementia without behavioral disturbance: Secondary | ICD-10-CM | POA: Insufficient documentation

## 2011-01-22 DIAGNOSIS — I120 Hypertensive chronic kidney disease with stage 5 chronic kidney disease or end stage renal disease: Secondary | ICD-10-CM | POA: Insufficient documentation

## 2011-01-22 DIAGNOSIS — M129 Arthropathy, unspecified: Secondary | ICD-10-CM | POA: Insufficient documentation

## 2011-01-22 DIAGNOSIS — H269 Unspecified cataract: Secondary | ICD-10-CM | POA: Insufficient documentation

## 2011-01-22 DIAGNOSIS — N186 End stage renal disease: Secondary | ICD-10-CM | POA: Insufficient documentation

## 2011-01-22 DIAGNOSIS — N2581 Secondary hyperparathyroidism of renal origin: Secondary | ICD-10-CM | POA: Insufficient documentation

## 2011-01-22 DIAGNOSIS — F411 Generalized anxiety disorder: Secondary | ICD-10-CM | POA: Insufficient documentation

## 2011-01-22 DIAGNOSIS — I252 Old myocardial infarction: Secondary | ICD-10-CM | POA: Insufficient documentation

## 2011-01-22 DIAGNOSIS — K219 Gastro-esophageal reflux disease without esophagitis: Secondary | ICD-10-CM | POA: Insufficient documentation

## 2011-01-22 DIAGNOSIS — I2510 Atherosclerotic heart disease of native coronary artery without angina pectoris: Secondary | ICD-10-CM | POA: Insufficient documentation

## 2011-01-22 DIAGNOSIS — E119 Type 2 diabetes mellitus without complications: Secondary | ICD-10-CM | POA: Insufficient documentation

## 2011-01-22 LAB — POCT I-STAT 4, (NA,K, GLUC, HGB,HCT)
Potassium: 4.7 mEq/L (ref 3.5–5.1)
Sodium: 138 mEq/L (ref 135–145)

## 2011-01-22 LAB — SURGICAL PCR SCREEN
MRSA, PCR: NEGATIVE
Staphylococcus aureus: NEGATIVE

## 2011-01-22 NOTE — Op Note (Signed)
[name], [name] NO.:  [id number]  MEDICAL RECORD NO.:  [id number]  LOCATION:  SDSC                         FACILITY:  [facility]  PHYSICIAN:  Fransisco Hertz, MD       DATE OF BIRTH:  1931/12/21  DATE OF PROCEDURE:  01/22/2011 DATE OF DISCHARGE:                              OPERATIVE REPORT   PROCEDURE:  Right first stage basilic vein transposition.  PREOPERATIVE DIAGNOSIS:  End-stage renal disease.  POSTOPERATIVE DIAGNOSIS:  End-stage renal disease.  SURGEON:  Fransisco Hertz, MD  ASSISTANT:  Newton Pigg, PA-C.  ANESTHESIA:  General.  FINDINGS IN THIS CASE:  At the end of the case, palpable thrill in the basilic vein and palpable radial pulse, also sclerotic basilic vein.  SPECIMENS:  None.  ESTIMATED BLOOD LOSS:  Minimal.  INDICATIONS:  This is a 77 year old gentleman who previously undergone left upper arm arteriovenous graft which thrombosed.  He came back to me for evaluation for additional access.  Based on his vein mapping, I  recommended a right basilic vein transposition in a staged fashion.  He  is aware of the risks of this procedure to include bleeding, infection,  and possible steal syndrome, possible nerve damage, possible ischemic monomelic neuropathy, possible need for additional procedures, and possible failure to mature.  The patient is aware of these risks and agreed to proceed forward.  DESCRIPTION OF OPERATION:  After full informed written consent was obtained from the patient, he was brought back to the operating room, placed supine upon the operating table.  Prior to induction, he received IV antibiotics.  After obtaining adequate anesthesia, he was prepped and draped in standard fashion for right arm access procedure.  I turned my attention first to his antecubitum.  Using SonoSite, I identified the brachial artery and also the basilic vein.  They were close enough I felt to use a longitudinal incision.  Subsequently I  injected about 10 mL of 1% lidocaine with epinephrine to obtain some anesthesia and made a longitudinal incision between the position of the brachial artery and the basilic vein.  I dissected down through the subcutaneous tissue and fascia using electrocautery.  I eventually got to the brachial artery which was a good artery without any substantial atherosclerotic disease. I dissected about a 3 cm segment at this artery and control was obtained with vessel loops and then dissected medially, eventually came across a small basilic vein.  This was dissected out proximally and distally and transected the distal end after tying it off with 2-0 silk.  I then interrogated this vein serially up to a 4 mm dilator.  There was definitely either a sclerotic vein or scarred portion of this vein more proximally, however, it would accept a 4-mm dilator, so I felt it was acceptable to proceed forward with an attempt.  I also did get some venous backbleeding after serially dilating this vein.  I spatulated the distal end of the vein to create at least a 4.5 mm venotomy through which I could sew the anastomosis.  I then placed the artery under tension proximally and distally with the vessel loops.  I then made an arteriotomy with #11 blade  and extended with Potts scissor for about 4.5 mm arteriotomy.  The vein was sewn to the artery in end-to-side configuration using a running stitch of 7-0 Prolene.  Prior to completing this anastomosis, I allowed the vein to back bleed and the 2 ends of the arteries to back bleed. There was no obvious clot obtained from this maneuver.  I completed anastomosis in usual fashion.  I then released all the vessel loops and clamps.  The patient immediately had a thrill in this vein, though it did have somewhat of a pulsatile character more proximally in the upper arm.  I faintly could feel the thrill also.  Distally I could feel easily palpable radial pulse which I confirmed  with Doppler proximal and distal to the anastomosis.  The waveforms were consistent with multiphasic flow.  Augmentation was not more than 50%.  The venous outflow vein had a flow signature consistent with widely patent fistula without any obvious arterial component.  I also checked upper arm and there was a dopplerable flow in the basilic vein.  I then irrigated out this wound.  There was no more active bleeding.  The subcutaneous tissue was reapproximated with a running stitch of 3-0 Vicryl and the skin was reapproximated with a running subcuticular 4-0 Monocryl.  Skin was then cleaned, dried, and reinforced with Dermabond.  The patient tolerated the procedure fine.     Fransisco Hertz, MD     BLC/MEDQ  D:  01/22/2011  T:  01/22/2011  Job:  [id number]  Electronically Signed by Leonides Sake MD on 01/22/2011 06:27:00 PM

## 2011-01-24 NOTE — Procedures (Unsigned)
CEPHALIC VEIN MAPPING  INDICATION:  Preop evaluation for AV fistula placement.  HISTORY: End-stage renal disease.  EXAM:  Right upper extremity vein mapping.  The right cephalic vein is compressible with diameter measurements ranging from 0.22 to 0.31 cm from the antecubital fossa to wrist area. The brachium level cephalic vein was not adequately visualized.  The right basilic vein is compressible with diameter measurements ranging from 0.32 to 0.71 cm.  See attached worksheet for all measurements.  IMPRESSION:  Patent right basilic and cephalic veins with diameter measurements as described on the attached worksheet.  ___________________________________________ Fransisco Hertz, MD  CH/MEDQ  D:  01/21/2011  T:  01/21/2011  Job:  [id number]

## 2011-03-25 ENCOUNTER — Other Ambulatory Visit (HOSPITAL_BASED_OUTPATIENT_CLINIC_OR_DEPARTMENT_OTHER): Payer: Self-pay | Admitting: Internal Medicine

## 2011-03-25 DIAGNOSIS — R51 Headache: Secondary | ICD-10-CM

## 2011-03-26 ENCOUNTER — Ambulatory Visit
Admission: RE | Admit: 2011-03-26 | Discharge: 2011-03-26 | Disposition: A | Discharge status: Home / Self Care | Payer: Medicare Other | Source: Ambulatory Visit | Attending: Internal Medicine | Admitting: Internal Medicine

## 2011-03-26 DIAGNOSIS — R51 Headache: Secondary | ICD-10-CM

## 2011-04-03 ENCOUNTER — Other Ambulatory Visit (HOSPITAL_COMMUNITY): Payer: Self-pay | Admitting: Nephrology

## 2011-04-03 ENCOUNTER — Encounter: Payer: Self-pay | Admitting: Vascular Surgery

## 2011-04-03 DIAGNOSIS — N186 End stage renal disease: Secondary | ICD-10-CM

## 2011-04-04 ENCOUNTER — Encounter: Payer: Self-pay | Admitting: Vascular Surgery

## 2011-04-04 ENCOUNTER — Ambulatory Visit (INDEPENDENT_AMBULATORY_CARE_PROVIDER_SITE_OTHER): Payer: Medicare Other | Admitting: Vascular Surgery

## 2011-04-04 VITALS — BP 128/70 | HR 73 | Temp 98.1°F | Ht 72.0 in

## 2011-04-04 DIAGNOSIS — N186 End stage renal disease: Secondary | ICD-10-CM

## 2011-04-04 NOTE — Progress Notes (Signed)
[facility] [place]  Postoperative Access Visit  History of Present Illness  DERION KREITER is a 77 y.o. year old male who presents for postoperative follow-up for: R 1st stage BVT (Date: 02/01/11).  The patient's wounds are healed.  The patient notesno steal symptoms.  The patient is able to complete their activities of daily living.  The patient's current symptoms are: none.  Physical Examination  Filed Vitals:   04/04/11 1249  BP: 128/70  Pulse: 73  Temp: 98.1 F (36.7 C)   R arm: Incision is healed, skin feels warm, hand grip is 5/5, sensation in digits is intact, palpable thrill, bruit can be auscultated, on sonosite: basilic vein > 6 mm throughout  Medical Decision Making  CHRISTERPHER CLOS is a 77 y.o. year old male who presents s/p successful R 1st stage BVT  The patient is ready for his R 2nd stage BVT, which will be scheduled in late Nov/early Dec at the patient's preference  Thank you for allowing [place] to participate in this patient's care.  Leonides Sake, MD [facility] [place] Office: [phone] Pager: [phone]

## 2011-04-05 ENCOUNTER — Ambulatory Visit (HOSPITAL_COMMUNITY)
Admission: RE | Admit: 2011-04-05 | Discharge: 2011-04-05 | Disposition: A | Discharge status: Home / Self Care | Payer: Medicare Other | Source: Ambulatory Visit | Attending: Nephrology | Admitting: Nephrology

## 2011-04-05 ENCOUNTER — Inpatient Hospital Stay (HOSPITAL_COMMUNITY): Admission: RE | Admit: 2011-04-05 | Payer: Medicare Other | Source: Ambulatory Visit

## 2011-04-05 DIAGNOSIS — Z452 Encounter for adjustment and management of vascular access device: Secondary | ICD-10-CM | POA: Insufficient documentation

## 2011-04-05 DIAGNOSIS — N186 End stage renal disease: Secondary | ICD-10-CM | POA: Insufficient documentation

## 2011-04-05 NOTE — Procedures (Signed)
L IJ HD Cath removed No complications

## 2011-04-07 ENCOUNTER — Other Ambulatory Visit: Payer: Self-pay | Admitting: Radiology

## 2011-04-07 MED FILL — Chlorhexidine Gluconate Liquid 4%: CUTANEOUS | Qty: 30 | Status: AC

## 2011-04-08 ENCOUNTER — Other Ambulatory Visit: Payer: Self-pay

## 2011-04-08 ENCOUNTER — Other Ambulatory Visit (HOSPITAL_COMMUNITY): Payer: Self-pay | Admitting: Nephrology

## 2011-04-08 ENCOUNTER — Ambulatory Visit (HOSPITAL_COMMUNITY)
Admission: RE | Admit: 2011-04-08 | Discharge: 2011-04-08 | Disposition: A | Discharge status: Home / Self Care | Payer: Medicare Other | Source: Ambulatory Visit | Attending: Nephrology | Admitting: Nephrology

## 2011-04-08 ENCOUNTER — Encounter (HOSPITAL_COMMUNITY): Payer: Self-pay

## 2011-04-08 DIAGNOSIS — Z01812 Encounter for preprocedural laboratory examination: Secondary | ICD-10-CM | POA: Insufficient documentation

## 2011-04-08 DIAGNOSIS — M129 Arthropathy, unspecified: Secondary | ICD-10-CM | POA: Insufficient documentation

## 2011-04-08 DIAGNOSIS — F3289 Other specified depressive episodes: Secondary | ICD-10-CM | POA: Insufficient documentation

## 2011-04-08 DIAGNOSIS — F329 Major depressive disorder, single episode, unspecified: Secondary | ICD-10-CM | POA: Insufficient documentation

## 2011-04-08 DIAGNOSIS — N186 End stage renal disease: Secondary | ICD-10-CM | POA: Insufficient documentation

## 2011-04-08 DIAGNOSIS — I252 Old myocardial infarction: Secondary | ICD-10-CM | POA: Insufficient documentation

## 2011-04-08 DIAGNOSIS — I2510 Atherosclerotic heart disease of native coronary artery without angina pectoris: Secondary | ICD-10-CM | POA: Insufficient documentation

## 2011-04-08 DIAGNOSIS — F0390 Unspecified dementia without behavioral disturbance: Secondary | ICD-10-CM | POA: Insufficient documentation

## 2011-04-08 DIAGNOSIS — K219 Gastro-esophageal reflux disease without esophagitis: Secondary | ICD-10-CM | POA: Insufficient documentation

## 2011-04-08 DIAGNOSIS — H269 Unspecified cataract: Secondary | ICD-10-CM | POA: Insufficient documentation

## 2011-04-08 DIAGNOSIS — E119 Type 2 diabetes mellitus without complications: Secondary | ICD-10-CM | POA: Insufficient documentation

## 2011-04-08 DIAGNOSIS — I120 Hypertensive chronic kidney disease with stage 5 chronic kidney disease or end stage renal disease: Secondary | ICD-10-CM | POA: Insufficient documentation

## 2011-04-08 LAB — DIFFERENTIAL
Basophils Absolute: 0 10*3/uL (ref 0.0–0.1)
Basophils Relative: 0 % (ref 0–1)
Lymphocytes Relative: 28 % (ref 12–46)
Neutro Abs: 4.8 10*3/uL (ref 1.7–7.7)
Neutrophils Relative %: 56 % (ref 43–77)

## 2011-04-08 LAB — CBC
HCT: 38.4 % — ABNORMAL LOW (ref 39.0–52.0)
Platelets: 174 10*3/uL (ref 150–400)
RDW: 15.8 % — ABNORMAL HIGH (ref 11.5–15.5)
WBC: 8.7 10*3/uL (ref 4.0–10.5)

## 2011-04-08 LAB — BASIC METABOLIC PANEL
Calcium: 9.6 mg/dL (ref 8.4–10.5)
Chloride: 94 mEq/L — ABNORMAL LOW (ref 96–112)
Creatinine, Ser: 12.41 mg/dL — ABNORMAL HIGH (ref 0.50–1.35)
GFR calc Af Amer: 4 mL/min — ABNORMAL LOW (ref >90)
GFR calc non Af Amer: 3 mL/min — ABNORMAL LOW (ref >90)

## 2011-04-08 LAB — GLUCOSE, CAPILLARY: Glucose-Capillary: 102 mg/dL — ABNORMAL HIGH (ref 70–99)

## 2011-04-08 LAB — PROTIME-INR
INR: 0.99 (ref 0.00–1.49)
Prothrombin Time: 13.3 seconds (ref 11.6–15.2)

## 2011-04-08 LAB — APTT: aPTT: 34 seconds (ref 24–37)

## 2011-04-08 MED ORDER — FENTANYL CITRATE 0.05 MG/ML IJ SOLN
INTRAMUSCULAR | Status: AC | PRN
  Administered 2011-04-08: 25 ug via INTRAVENOUS

## 2011-04-08 MED ORDER — CEFAZOLIN SODIUM 1-5 GM-% IV SOLN
INTRAVENOUS | Status: AC
  Filled 2011-04-08: qty 50

## 2011-04-08 MED ORDER — MIDAZOLAM HCL 2 MG/2ML IJ SOLN
INTRAMUSCULAR | Status: AC
  Filled 2011-04-08: qty 2

## 2011-04-08 MED ORDER — HEPARIN SODIUM (PORCINE) 1000 UNIT/ML IJ SOLN
INTRAMUSCULAR | Status: AC
  Filled 2011-04-08: qty 1

## 2011-04-08 MED ORDER — MIDAZOLAM HCL 5 MG/5ML IJ SOLN
INTRAMUSCULAR | Status: AC | PRN
  Administered 2011-04-08: 2 mg via INTRAVENOUS

## 2011-04-08 MED ORDER — GELATIN ABSORBABLE 12-7 MM EX MISC
CUTANEOUS | Status: AC
  Filled 2011-04-08: qty 1

## 2011-04-08 MED ORDER — FENTANYL CITRATE 0.05 MG/ML IJ SOLN
INTRAMUSCULAR | Status: AC
  Filled 2011-04-08: qty 2

## 2011-04-08 MED ORDER — SODIUM CHLORIDE 0.9 % IV SOLN
INTRAVENOUS | Status: DC
  Administered 2011-04-08: 08:00:00 via INTRAVENOUS

## 2011-04-08 MED ORDER — CEFAZOLIN SODIUM 1-5 GM-% IV SOLN: 1.0000 g | INTRAVENOUS | Status: DC

## 2011-04-08 NOTE — Progress Notes (Signed)
Spoke to [place] in Dr. Nicky Pugh office regarding orders, she is aware orders need to be entered for surgery on 04/14/11.

## 2011-04-08 NOTE — Procedures (Signed)
Successful RT IJ tunneled HD cath 23 cm hemosplit Tips svc/ra No comp Stable Ready to use

## 2011-04-08 NOTE — H&P (Signed)
[name] is an 77 y.o. male.   Chief Complaint:" I need a dialysis catheter" HPI: Patient with history of ESRD and recent removal of hemodialysis catheter due to infection presents today for placement of new dialysis catheter.  Past Medical History  Diagnosis Date  . Diabetes mellitus   . Hypertension   . Chronic kidney disease   . Arthritis   . Myocardial infarction 02/2010  . Cataract   . CAD (coronary artery disease)   . Secondary hyperthyroidism   . Dementia   . History of thrombocytopenia   . GERD (gastroesophageal reflux disease)   . Anxiety     paranoia    Past Surgical History  Procedure Date  . Arteriovenous graft placement 05/01/10    Left upper arm brachial artery to axillary vein AVG  . Thrombectomy / arteriovenous graft revision 11/07/10    Left upper arm AVG  by Dr. Imogene Burn  . Diatek catheter insertion 12/05/10    Left side    No family history on file. Social History:  reports that he quit smoking about 13 years ago. He has never used smokeless tobacco. He reports that he does not drink alcohol or use illicit drugs.  Allergies: No Known Allergies  Medications Prior to Admission  Medication Sig Dispense Refill  . amLODipine (NORVASC) 10 MG tablet Take 10 mg by mouth daily.        . calcium acetate (PHOSLO) 667 MG capsule Take 1,334 mg by mouth 3 (three) times daily with meals. Also 2 caps with snacks.      . cinacalcet (SENSIPAR) 90 MG tablet Take 90 mg by mouth daily.       Marland Kitchen docusate sodium (COLACE) 100 MG capsule Take 200 mg by mouth at bedtime.       Marland Kitchen lanthanum (FOSRENOL) 1000 MG chewable tablet Chew 2,000 mg by mouth 3 (three) times daily with meals.       Marland Kitchen lisinopril (PRINIVIL,ZESTRIL) 40 MG tablet Take 40 mg by mouth daily.        Marland Kitchen LORazepam (ATIVAN) 0.5 MG tablet Take 0.5 mg by mouth every 4 (four) hours as needed. For anxiety      . Nutritional Supplements (NEPRO PO) Take by mouth 3 (three) times daily.        Marland Kitchen omeprazole (PRILOSEC) 20 MG  capsule Take 20 mg by mouth daily.       . Oxycodone HCl 10 MG TABS Take 10 mg by mouth every 4 (four) hours as needed. For pain scale of 6-10      . polyethylene glycol (MIRALAX / GLYCOLAX) packet Take 17 g by mouth daily as needed. constipation      . QUEtiapine (SEROQUEL) 50 MG tablet Take 50 mg by mouth daily.       . sevelamer (RENAGEL) 800 MG tablet Take 800 mg by mouth 3 (three) times daily with meals.        . simvastatin (ZOCOR) 20 MG tablet Take 20 mg by mouth at bedtime.        Medications Prior to Admission  Medication Dose Route Frequency Provider Last Rate Last Dose  . 0.9 %  sodium chloride infusion   Intravenous Continuous Trevor Iha, MD 20 mL/hr at 04/08/11 0805    . ceFAZolin (ANCEF) IVPB 1 g/50 mL premix  1 g Intravenous On Call D Kevin Patrick Sohm, PA      . gelatin adsorbable (GELFOAM/SURGIFOAM) 12-7 MM sponge 12-7 mm           .  heparin 1000 UNIT/ML injection             Results for orders placed during the hospital encounter of 04/08/11 (from the past 48 hour(s))  APTT     Status: Normal   Collection Time   04/08/11  7:59 AM      Component Value Range Comment   aPTT 34  24 - 37 (seconds)   BASIC METABOLIC PANEL     Status: Abnormal   Collection Time   04/08/11  7:59 AM      Component Value Range Comment   Sodium 135  135 - 145 (mEq/L)    Potassium 6.0 (*) 3.5 - 5.1 (mEq/L)    Chloride 94 (*) 96 - 112 (mEq/L)    CO2 24  19 - 32 (mEq/L)    Glucose, Bld 96  70 - 99 (mg/dL)    BUN 94 (*) 6 - 23 (mg/dL)    Creatinine, Ser 09.81 (*) 0.50 - 1.35 (mg/dL)    Calcium 9.6  8.4 - 10.5 (mg/dL)    GFR calc non Af Amer 3 (*) >90 (mL/min)    GFR calc Af Amer 4 (*) >90 (mL/min)   CBC     Status: Abnormal   Collection Time   04/08/11  7:59 AM      Component Value Range Comment   WBC 8.7  4.0 - 10.5 (K/uL)    RBC 4.30  4.22 - 5.81 (MIL/uL)    Hemoglobin 12.3 (*) 13.0 - 17.0 (g/dL)    HCT 19.1 (*) 47.8 - 52.0 (%)    MCV 89.3  78.0 - 100.0 (fL)    MCH 28.6  26.0 - 34.0  (pg)    MCHC 32.0  30.0 - 36.0 (g/dL)    RDW 29.5 (*) 62.1 - 15.5 (%)    Platelets 174  150 - 400 (K/uL)   DIFFERENTIAL     Status: Normal   Collection Time   04/08/11  7:59 AM      Component Value Range Comment   Neutrophils Relative 56  43 - 77 (%)    Neutro Abs 4.8  1.7 - 7.7 (K/uL)    Lymphocytes Relative 28  12 - 46 (%)    Lymphs Abs 2.4  0.7 - 4.0 (K/uL)    Monocytes Relative 11  3 - 12 (%)    Monocytes Absolute 1.0  0.1 - 1.0 (K/uL)    Eosinophils Relative 5  0 - 5 (%)    Eosinophils Absolute 0.5  0.0 - 0.7 (K/uL)    Basophils Relative 0  0 - 1 (%)    Basophils Absolute 0.0  0.0 - 0.1 (K/uL)   PROTIME-INR     Status: Normal   Collection Time   04/08/11  7:59 AM      Component Value Range Comment   Prothrombin Time 13.3  11.6 - 15.2 (seconds)    INR 0.99  0.00 - 1.49    GLUCOSE, CAPILLARY     Status: Abnormal   Collection Time   04/08/11  8:01 AM      Component Value Range Comment   Glucose-Capillary 102 (*) 70 - 99 (mg/dL)    No results found.  Review of Systems  Constitutional: Negative for fever and chills.  HENT: Positive for neck pain.   Respiratory: Negative for cough and shortness of breath.   Cardiovascular: Negative for chest pain.  Gastrointestinal: Negative for nausea, vomiting and abdominal pain.  Neurological: Negative for headaches.  Endo/Heme/Allergies: Does not  bruise/bleed easily.    Blood pressure 141/63, pulse 80, temperature 98.1 F (36.7 C), temperature source Oral, resp. rate 18, height 6' (1.829 m), weight 195 lb (88.451 kg), SpO2 96.00%. Physical Exam  Constitutional: He appears well-developed and well-nourished.  Cardiovascular: Normal rate and regular rhythm.   Murmur [name]. Respiratory: Effort normal and breath sounds normal. He has no wheezes.  GI: Soft. Bowel sounds are normal. There is no tenderness.  Neurological:       History of dementia;oriented to person and place , not time     Assessment/Plan Patient with history of  ESRD and hyperkalemia; plan is for hemodialysis catheter placement today.  Kristion Holifield,D KEVIN 04/08/2011, 8:45 AM

## 2011-04-08 NOTE — ED Notes (Signed)
K of 6.0 called to Bard Herbert PA.  Pt going to dialysis immediately after discharge. Son Sheffield Slider taking pt to dialysis upon discharge.   Discharge instructions reviewed with pt and son.

## 2011-04-11 ENCOUNTER — Encounter (HOSPITAL_COMMUNITY): Payer: Self-pay

## 2011-04-13 MED ORDER — SODIUM CHLORIDE 0.9 % IV SOLN
INTRAVENOUS | Status: DC
  Administered 2011-04-14: 08:00:00 via INTRAVENOUS

## 2011-04-13 MED ORDER — DEXTROSE 5 % IV SOLN
1.5000 g | INTRAVENOUS | Status: AC
  Administered 2011-04-14: 1.5 g via INTRAVENOUS
  Filled 2011-04-13: qty 1.5

## 2011-04-14 ENCOUNTER — Encounter (HOSPITAL_COMMUNITY): Payer: Self-pay | Admitting: Anesthesiology

## 2011-04-14 ENCOUNTER — Encounter (HOSPITAL_COMMUNITY): Payer: Self-pay

## 2011-04-14 ENCOUNTER — Ambulatory Visit (HOSPITAL_COMMUNITY): Payer: Medicare Other | Admitting: Anesthesiology

## 2011-04-14 ENCOUNTER — Encounter (HOSPITAL_COMMUNITY)
Admission: RE | Disposition: A | Discharge status: Home / Self Care | Payer: Self-pay | Source: Ambulatory Visit | Attending: Vascular Surgery

## 2011-04-14 ENCOUNTER — Ambulatory Visit (HOSPITAL_COMMUNITY)
Admission: RE | Admit: 2011-04-14 | Discharge: 2011-04-14 | Disposition: A | Discharge status: Home / Self Care | Payer: Medicare Other | Source: Ambulatory Visit | Attending: Vascular Surgery | Admitting: Vascular Surgery

## 2011-04-14 DIAGNOSIS — Z992 Dependence on renal dialysis: Secondary | ICD-10-CM

## 2011-04-14 DIAGNOSIS — K219 Gastro-esophageal reflux disease without esophagitis: Secondary | ICD-10-CM | POA: Insufficient documentation

## 2011-04-14 DIAGNOSIS — I2510 Atherosclerotic heart disease of native coronary artery without angina pectoris: Secondary | ICD-10-CM | POA: Insufficient documentation

## 2011-04-14 DIAGNOSIS — N186 End stage renal disease: Secondary | ICD-10-CM | POA: Insufficient documentation

## 2011-04-14 DIAGNOSIS — E119 Type 2 diabetes mellitus without complications: Secondary | ICD-10-CM | POA: Insufficient documentation

## 2011-04-14 DIAGNOSIS — I120 Hypertensive chronic kidney disease with stage 5 chronic kidney disease or end stage renal disease: Secondary | ICD-10-CM | POA: Insufficient documentation

## 2011-04-14 HISTORY — PX: AV FISTULA PLACEMENT: SHX1204

## 2011-04-14 HISTORY — DX: Cerebral infarction, unspecified: I63.9

## 2011-04-14 LAB — SURGICAL PCR SCREEN: MRSA, PCR: NEGATIVE

## 2011-04-14 LAB — POCT I-STAT 4, (NA,K, GLUC, HGB,HCT): Hemoglobin: 13.9 g/dL (ref 13.0–17.0)

## 2011-04-14 SURGERY — ARTERIOVENOUS (AV) FISTULA CREATION
Anesthesia: General | Site: Arm Upper | Laterality: Right | Wound class: Clean

## 2011-04-14 MED ORDER — PROTAMINE SULFATE 10 MG/ML IV SOLN
INTRAVENOUS | Status: DC | PRN
  Administered 2011-04-14 (×6): 5 mg via INTRAVENOUS

## 2011-04-14 MED ORDER — THROMBIN 20000 UNITS EX KIT
PACK | OROMUCOSAL | Status: DC | PRN
  Administered 2011-04-14: 09:00:00 via TOPICAL

## 2011-04-14 MED ORDER — ONDANSETRON HCL 4 MG/2ML IJ SOLN: 4.0000 mg | Freq: Once | INTRAMUSCULAR | Status: DC | PRN

## 2011-04-14 MED ORDER — PHENYLEPHRINE HCL 10 MG/ML IJ SOLN
10.0000 mg | INTRAVENOUS | Status: DC | PRN
  Administered 2011-04-14: 50 ug/min via INTRAVENOUS

## 2011-04-14 MED ORDER — MUPIROCIN 2 % EX OINT
TOPICAL_OINTMENT | CUTANEOUS | Status: AC
  Administered 2011-04-14: 1 via NASAL
  Filled 2011-04-14: qty 22

## 2011-04-14 MED ORDER — SODIUM POLYSTYRENE SULFONATE 15 GM/60ML PO SUSP
60.0000 g | Freq: Once | ORAL | Status: DC
  Filled 2011-04-14: qty 240

## 2011-04-14 MED ORDER — PHENYLEPHRINE HCL 10 MG/ML IJ SOLN
20.0000 mg | INTRAVENOUS | Status: DC | PRN
  Administered 2011-04-14: 25 ug/min via INTRAVENOUS

## 2011-04-14 MED ORDER — OXYCODONE HCL 5 MG PO TABS: 5.0000 mg | ORAL_TABLET | ORAL | Status: AC | PRN

## 2011-04-14 MED ORDER — FENTANYL CITRATE 0.05 MG/ML IJ SOLN
INTRAMUSCULAR | Status: DC | PRN
  Administered 2011-04-14: 50 ug via INTRAVENOUS
  Administered 2011-04-14: 75 ug via INTRAVENOUS
  Administered 2011-04-14: 100 ug via INTRAVENOUS

## 2011-04-14 MED ORDER — HYDROMORPHONE HCL PF 1 MG/ML IJ SOLN: 0.2500 mg | INTRAMUSCULAR | Status: DC | PRN

## 2011-04-14 MED ORDER — ARTIFICIAL TEARS OP OINT
TOPICAL_OINTMENT | OPHTHALMIC | Status: DC | PRN
  Administered 2011-04-14: 1 via OPHTHALMIC

## 2011-04-14 MED ORDER — PROPOFOL 10 MG/ML IV EMUL
INTRAVENOUS | Status: DC | PRN
  Administered 2011-04-14: 100 mg via INTRAVENOUS
  Administered 2011-04-14: 60 mg via INTRAVENOUS
  Administered 2011-04-14: 40 mg via INTRAVENOUS

## 2011-04-14 MED ORDER — SODIUM CHLORIDE 0.9 % IR SOLN
Status: DC | PRN
  Administered 2011-04-14: 08:00:00

## 2011-04-14 MED ORDER — LIDOCAINE HCL (CARDIAC) 20 MG/ML IV SOLN
INTRAVENOUS | Status: DC | PRN
  Administered 2011-04-14: 50 mg via INTRAVENOUS

## 2011-04-14 MED ORDER — SODIUM CHLORIDE 0.9 % IR SOLN
Status: DC | PRN
  Administered 2011-04-14: 1000 mL

## 2011-04-14 MED ORDER — MORPHINE SULFATE 2 MG/ML IJ SOLN: 0.0500 mg/kg | INTRAMUSCULAR | Status: DC | PRN

## 2011-04-14 MED ORDER — PHENYLEPHRINE HCL 10 MG/ML IJ SOLN
INTRAMUSCULAR | Status: DC | PRN
  Administered 2011-04-14 (×5): 40 ug via INTRAVENOUS

## 2011-04-14 MED ORDER — LIDOCAINE-EPINEPHRINE (PF) 1 %-1:200000 IJ SOLN
INTRAMUSCULAR | Status: DC | PRN
  Administered 2011-04-14: 30 mL

## 2011-04-14 MED ORDER — HEPARIN SODIUM (PORCINE) 1000 UNIT/ML IJ SOLN
INTRAMUSCULAR | Status: DC | PRN
  Administered 2011-04-14: 2000 [IU] via INTRAVENOUS
  Administered 2011-04-14: 3000 [IU] via INTRAVENOUS

## 2011-04-14 MED ORDER — ONDANSETRON HCL 4 MG/2ML IJ SOLN
INTRAMUSCULAR | Status: DC | PRN
  Administered 2011-04-14: 4 mg via INTRAVENOUS

## 2011-04-14 MED ORDER — MEPERIDINE HCL 25 MG/ML IJ SOLN: 6.2500 mg | INTRAMUSCULAR | Status: DC | PRN

## 2011-04-14 MED ORDER — BUPIVACAINE HCL (PF) 0.5 % IJ SOLN
INTRAMUSCULAR | Status: DC | PRN
  Administered 2011-04-14: 30 mL

## 2011-04-14 SURGICAL SUPPLY — 41 items
ADH SKN CLS APL DERMABOND .7 (GAUZE/BANDAGES/DRESSINGS) ×2
CANISTER SUCTION 2500CC (MISCELLANEOUS) ×2 IMPLANT
CLIP TI MEDIUM 6 (CLIP) ×2 IMPLANT
CLIP TI WIDE RED SMALL 6 (CLIP) ×4 IMPLANT
CLOTH BEACON ORANGE TIMEOUT ST (SAFETY) ×2 IMPLANT
COVER PROBE W GEL 5X96 (DRAPES) IMPLANT
COVER SURGICAL LIGHT HANDLE (MISCELLANEOUS) ×4 IMPLANT
DECANTER SPIKE VIAL GLASS SM (MISCELLANEOUS) ×2 IMPLANT
DERMABOND ADVANCED (GAUZE/BANDAGES/DRESSINGS) ×2
DERMABOND ADVANCED .7 DNX12 (GAUZE/BANDAGES/DRESSINGS) ×1 IMPLANT
DRAIN PENROSE 1/2X12 LTX STRL (WOUND CARE) IMPLANT
ELECT REM PT RETURN 9FT ADLT (ELECTROSURGICAL) ×2
ELECTRODE REM PT RTRN 9FT ADLT (ELECTROSURGICAL) ×1 IMPLANT
GLOVE BIO SURGEON STRL SZ7 (GLOVE) ×2 IMPLANT
GLOVE BIOGEL PI IND STRL 6.5 (GLOVE) ×2 IMPLANT
GLOVE BIOGEL PI IND STRL 7.5 (GLOVE) ×1 IMPLANT
GLOVE BIOGEL PI INDICATOR 6.5 (GLOVE) ×2
GLOVE BIOGEL PI INDICATOR 7.5 (GLOVE) ×3
GLOVE SURG SS PI 7.5 STRL IVOR (GLOVE) ×2 IMPLANT
GOWN PREVENTION PLUS XLARGE (GOWN DISPOSABLE) ×2 IMPLANT
GOWN STRL NON-REIN LRG LVL3 (GOWN DISPOSABLE) ×5 IMPLANT
KIT BASIN OR (CUSTOM PROCEDURE TRAY) ×2 IMPLANT
KIT ROOM TURNOVER OR (KITS) ×2 IMPLANT
LOOP VESSEL MAXI BLUE (MISCELLANEOUS) ×1 IMPLANT
NS IRRIG 1000ML POUR BTL (IV SOLUTION) ×2 IMPLANT
PACK CV ACCESS (CUSTOM PROCEDURE TRAY) ×2 IMPLANT
PAD ARMBOARD 7.5X6 YLW CONV (MISCELLANEOUS) ×2 IMPLANT
SPONGE SURGIFOAM ABS GEL 100 (HEMOSTASIS) ×1 IMPLANT
SUT MNCRL AB 4-0 PS2 18 (SUTURE) ×2 IMPLANT
SUT PROLENE 6 0 BV (SUTURE) ×1 IMPLANT
SUT PROLENE 7 0 BV 1 (SUTURE) ×4 IMPLANT
SUT SILK 2 0 SH (SUTURE) ×1 IMPLANT
SUT SILK 3 0 SH 30 (SUTURE) ×1 IMPLANT
SUT VIC AB 2-0 CT1 27 (SUTURE) ×2
SUT VIC AB 2-0 CT1 TAPERPNT 27 (SUTURE) IMPLANT
SUT VIC AB 3-0 SH 27 (SUTURE) ×2
SUT VIC AB 3-0 SH 27X BRD (SUTURE) ×1 IMPLANT
TOWEL OR 17X24 6PK STRL BLUE (TOWEL DISPOSABLE) ×2 IMPLANT
TOWEL OR 17X26 10 PK STRL BLUE (TOWEL DISPOSABLE) ×2 IMPLANT
UNDERPAD 30X30 INCONTINENT (UNDERPADS AND DIAPERS) ×2 IMPLANT
WATER STERILE IRR 1000ML POUR (IV SOLUTION) ×2 IMPLANT

## 2011-04-14 NOTE — Op Note (Signed)
OPERATIVE NOTE   PROCEDURE: right second stage basilic vein transposition (brachiobasilic arteriovenous fistula) including revision of arterial anastomosis  PRE-OPERATIVE DIAGNOSIS: end stage renal disease   POST-OPERATIVE DIAGNOSIS: same as above  SURGEON: Leonides Sake, MD  ASSISTANT(S): Della Goo, [facility]  ANESTHESIA: general  ESTIMATED BLOOD LOSS: 50 cc  FINDING(S): 1.  Distal basilic vein inadequate requiring revision of arterial anastomosis to more proximal location on brachial artery 2.  Proximal 2/3 of basilic vein 6-8 mm in diameter  SPECIMEN(S):  none  INDICATIONS:   [name] is a 77 y.o. male who  presents with end stage renal disease and s/p successful first stage basilic vein transposition.  The patient is scheduled for right second stage basilic vein transposition.  The patient is aware the risks include but are not limited to: bleeding, infection, steal syndrome, nerve damage, ischemic monomelic neuropathy, failure to mature, and need for additional procedures.  The patient is aware of the risks of the procedure and elects to proceed forward.  DESCRIPTION: After full informed written consent was obtained from the patient, the patient was brought back to the operating room and placed supine upon the operating table.  Prior to induction, the patient received IV antibiotics.   After obtaining adequate anesthesia, the patient was then prepped and draped in the standard fashion for a right arm access procedure.  I turned my attention first to identifying the patient's brachiobasilic arteriovenous fistula.  Using SonoSite guidance, the location of this fistula was marked out on the skin.   At this point, I injected local anesthetic to obtain a field block of the antecubitum.  In total, I injected about 30 mL of a 1:1 mixture of 0.5% Marcaine without epinephrine and 1% lidocaine with epinephrine.  I made an longitudinal incision over the fistula from its arterial anastomosis  up to its axillary extent.  I carefully dissected the fistula away from its adjacent nerves.  Eventually the entirety of this fistula was mobilized and I dissected a plane on top of the bicipital fascia with electrocautery.  Unfortunately, the distal 1/3 of the basilic vein was sclerotic and too small for continued use as a fistula, so I dissected out the brachial artery more proximal to the previous anastomosis.  I gave the patient 5000 units of Heparin intravenously to obtain anticoagulation.  I then tied off the distal basilic vein with a 2-0 Silk and transected the vein at the portion of the vein that adequate.  I then tunneled with a Gore metal tunneler in a curvilinear fashion and passed the vein through the metal tunnel.  I then put the artery under tension proximally and distally with vessel loops.  I made an arteriotomy and extended it with a Pott's scissor for a 4 mm arteriotomy.  The vein was sewn to the artery in an end-to-side configuration with a running stitch of 7-0 Prolene.  Prior to completing the anastomosis, I allowed the vein and both ends of the artery to bleed.  There was no clot and excellent backbleeding from all vessels.  The anastomosis was completed in the usual fashion.  I released all vessel loops and the venous clamp.  There was a much improved flow in the fistula as evident by a stronger thrill.  Distally, there was a palpable radial pulse which was verified with continuous doppler.  The deep subcutaneous tissue was inspected for bleeding.  Bleeding was controlled with electrocautery and placement of large pieces of thrombin and gelfoam.  I washed out  the surgical site after waiting a few minutes, and there was no further bleeding.  The deep subcutaneous tissue was reapproximated with mattress sutures of 3-0 Vicryl to eliminate some of the dead space.  The superficial subcutaneous tissue was then reapproximated along the incision line with a running stitch of 3-0 Vicryl.  The skin was  then reapproximated with a running subcuticular of 4-0 Monocryl.  The skin was then cleaned, dried, and reinforced with Dermabond.  The patient tolerated this procedure well.   COMPLICATIONS: none  CONDITION: stable  Leonides Sake, MD [facility] [place] Office: [phone] Pager: [phone]  04/14/2011, 11:16 AM

## 2011-04-14 NOTE — Anesthesia Preprocedure Evaluation (Addendum)
Anesthesia Evaluation  Patient identified by MRN, date of birth, ID band Patient awake  General Assessment Comment:Pt demented; answers questions appropriately. Pt is hard of hearing.  Reviewed: Allergy & Precautions, H&P , NPO status , Patient's Chart, lab work & pertinent test results  Airway Mallampati: II TM Distance: >3 FB Neck ROM: Limited    Dental  (+) Poor Dentition, Missing, Edentulous Upper and Dental Advisory Given,    Pulmonary  clear to auscultation        Cardiovascular hypertension, Pt. on medications + angina with exertion + CAD Regular Normal    Neuro/Psych PSYCHIATRIC DISORDERS Anxiety Pt has dementia CVA, No Residual Symptoms    GI/Hepatic GERD-  Medicated,Difficulty swallowing medications    Endo/Other  Diabetes mellitus- (diet controlled), Well Controlled, Type 2Hyperthyroidism   Renal/GU ESRF and DialysisRenal disease     Musculoskeletal  (+) Arthritis -, Osteoarthritis,    Abdominal (+) obese,   Peds  Hematology  (+) Blood dyscrasia, anemia ,   Anesthesia Other Findings   Reproductive/Obstetrics                           Anesthesia Physical Anesthesia Plan  ASA: III  Anesthesia Plan: General   Post-op Pain Management:    Induction: Intravenous  Airway Management Planned: LMA  Additional Equipment:   Intra-op Plan:   Post-operative Plan: Extubation in OR  Informed Consent:   Dental advisory given  Plan Discussed with: Anesthesiologist  Anesthesia Plan Comments:         Anesthesia Quick Evaluation

## 2011-04-14 NOTE — Preoperative (Signed)
Beta Blockers   Reason not to administer Beta Blockers:Not Applicable 

## 2011-04-14 NOTE — Transfer of Care (Signed)
Immediate Anesthesia Transfer of Care Note  Patient: [name]  Procedure(s) Performed:  ARTERIOVENOUS (AV) FISTULA CREATION -  Second  stage basilic vein transposition  Patient Location: PACU  Anesthesia Type: General  Level of Consciousness: awake, oriented and patient cooperative  Airway & Oxygen Therapy: Patient Spontanous Breathing and Patient connected to nasal cannula oxygen  Post-op Assessment: Report given to PACU RN, Post -op Vital signs reviewed and stable and Patient moving all extremities  Post vital signs: Reviewed and stable  Complications: No apparent anesthesia complications

## 2011-04-14 NOTE — Progress Notes (Signed)
Pt. Remarks that he is unable to void at this time.

## 2011-04-14 NOTE — Anesthesia Procedure Notes (Addendum)
Procedure Name: LMA Insertion Date/Time: 04/14/2011 8:18 AM Performed by: Leona Singleton A. Oxygen Delivery Method: Circle System Utilized Preoxygenation: Pre-oxygenation with 100% oxygen Intubation Type: IV induction LMA: LMA inserted LMA Size: 5.0 Tube type: Oral Number of attempts: 1 Placement Confirmation: positive ETCO2 and breath sounds checked- equal and bilateral Tube secured with: Tape Dental Injury: Teeth and Oropharynx as per pre-operative assessment

## 2011-04-14 NOTE — Anesthesia Postprocedure Evaluation (Signed)
  Anesthesia Post-op Note  Patient: [name]  Procedure(s) Performed:  ARTERIOVENOUS (AV) FISTULA CREATION -  Second  stage basilic vein transposition  Patient Location: PACU  Anesthesia Type: General  Level of Consciousness: awake and alert   Airway and Oxygen Therapy: Patient Spontanous Breathing and Patient connected to nasal cannula oxygen  Post-op Pain: mild  Post-op Assessment: Post-op Vital signs reviewed, Patient's Cardiovascular Status Stable, Respiratory Function Stable, Patent Airway, No signs of Nausea or vomiting and Pain level controlled  Post-op Vital Signs: Reviewed and stable  Complications: No apparent anesthesia complications

## 2011-04-14 NOTE — H&P (Signed)
[facility] [place]  Brief Access History and Physical  History of Present Illness  [name] is a 77 y.o. male who presents with chief complaint: here for my surgery.  The patient presents today for placement of R 2nd stage BVT.    Past Medical History  Diagnosis Date  . Diabetes mellitus   . Hypertension   . Arthritis   . Cataract   . CAD (coronary artery disease)   . Secondary hyperthyroidism   . Dementia   . History of thrombocytopenia   . GERD (gastroesophageal reflux disease)   . Anxiety     paranoia  . Stroke   . Chronic kidney disease     Tue, Thurs, Sat dialysis, [facility]    Past Surgical History  Procedure Date  . Arteriovenous graft placement 05/01/10    Left upper arm brachial artery to axillary vein AVG  . Thrombectomy / arteriovenous graft revision 11/07/10    Left upper arm AVG  by Dr. Imogene Burn  . Diatek catheter insertion 12/05/10    Left side  . Bone spur     foot surgery    History   Social History  . Marital Status: Widowed    Spouse Name: N/A    Number of Children: N/A  . Years of Education: N/A   Occupational History  . Not on file.   Social History Main Topics  . Smoking status: Never Smoker   . Smokeless tobacco: Never Used  . Alcohol Use: Yes     approx 30 years ago  . Drug Use: No  . Sexually Active: Not on file   Other Topics Concern  . Not on file   Social History Narrative  . No narrative on file    History reviewed. No pertinent family history.  No current facility-administered medications on file prior to encounter.   Current Outpatient Prescriptions on File Prior to Encounter  Medication Sig Dispense Refill  . amLODipine (NORVASC) 10 MG tablet Take 10 mg by mouth daily.       . B Complex-C-Biotin-E-FA (RENATABS) 1 MG TABS Take 1 mg by mouth daily.        . calcium acetate (PHOSLO) 667 MG capsule Take 1,334 mg by mouth 3 (three) times daily with meals. Also 2 caps with snacks.       . cinacalcet (SENSIPAR) 90 MG tablet Take 90 mg by mouth daily.       Marland Kitchen docusate sodium (COLACE) 100 MG capsule Take 200 mg by mouth at bedtime.       Marland Kitchen lanthanum (FOSRENOL) 1000 MG chewable tablet Chew 2,000 mg by mouth 3 (three) times daily with meals.       Marland Kitchen lisinopril (PRINIVIL,ZESTRIL) 40 MG tablet Take 40 mg by mouth daily.        Marland Kitchen LORazepam (ATIVAN) 0.5 MG tablet Take 0.5 mg by mouth every 4 (four) hours as needed. For anxiety      . LORazepam (ATIVAN) 1 MG tablet Take 1 mg by mouth 3 (three) times a week. Give prior to dialysis on Tuesday, Thursday and Saturday.       . Menthol-Zinc Oxide (REMEDY CALAZIME) 0.2-20 % PSTE Apply 1 application topically daily.        . Nutritional Supplements (NEPRO PO) Take by mouth 3 (three) times daily.        Marland Kitchen omeprazole (PRILOSEC) 20 MG capsule Take 20 mg by mouth daily.       Marland Kitchen  oxycodone (OXY-IR) 5 MG capsule Take 5 mg by mouth every 4 (four) hours as needed. For a pain scale of 1-5       . Oxycodone HCl 10 MG TABS Take 10 mg by mouth every 4 (four) hours as needed. For pain scale of 6-10      . polyethylene glycol (MIRALAX / GLYCOLAX) packet Take 17 g by mouth daily as needed. constipation      . QUEtiapine (SEROQUEL) 50 MG tablet Take 50 mg by mouth daily.       . sevelamer (RENAGEL) 800 MG tablet Take 800 mg by mouth 3 (three) times daily with meals.        . simvastatin (ZOCOR) 20 MG tablet Take 20 mg by mouth at bedtime.       . sorbitol 70 % solution Take 30 mLs by mouth daily as needed. constipation         No Known Allergies  Review of Systems: As listed above, otherwise negative.  Physical Examination  Filed Vitals:   04/14/11 0621  BP: 150/69  Pulse: 78  Temp: 98.4 F (36.9 C)  TempSrc: Oral  Resp: 18  SpO2: 96%   There is no height or weight on file to calculate BMI.  General: A&O x 3, WDWN  Pulmonary: Sym exp, good air movt, CTAB, no rales, rhonchi, & wheezing  Cardiac: RRR, Nl S1, S2, no Murmurs, rubs or  gallops  Gastrointestinal: soft, NTND, -G/R, - HSM, - masses, - CVAT B  Musculoskeletal: M/S 5/5 throughout , Extremities without ischemic changes , R arm with palpable thrill and bruit, contracted  Laboratory    Component Value Date/Time   NA 138 04/14/2011 0656   K 5.8* 04/14/2011 0656   CL 94* 04/08/2011 0759   CO2 24 04/08/2011 0759   GLUCOSE 101* 04/14/2011 0656   BUN 94* 04/08/2011 0759   CREATININE 12.41* 04/08/2011 0759   CALCIUM 9.6 04/08/2011 0759   GFRNONAA 3* 04/08/2011 0759   GFRAA 4* 04/08/2011 0759    Medical Decision Making  [name] is a 77 y.o. male who presents with: successful R 1st stage BVT.   The patient is scheduled for: R 2nd stage BVT  Risk, benefits, and alternatives to access surgery were discussed.  The patient is aware the risks include but are not limited to: bleeding, infection, steal syndrome, nerve damage, ischemic monomelic neuropathy, failure to mature, and need for additional procedures.  The patient has agreed to proceed forward with the procedure.  Leonides Sake, MD [facility] [place] Office: [phone] Pager: [phone]

## 2011-04-15 ENCOUNTER — Encounter (HOSPITAL_COMMUNITY): Payer: Self-pay | Admitting: Vascular Surgery

## 2011-05-22 ENCOUNTER — Encounter: Payer: Self-pay | Admitting: Vascular Surgery

## 2011-05-23 ENCOUNTER — Encounter: Payer: Self-pay | Admitting: Vascular Surgery

## 2011-05-23 ENCOUNTER — Ambulatory Visit (INDEPENDENT_AMBULATORY_CARE_PROVIDER_SITE_OTHER): Payer: Medicare Other | Admitting: Vascular Surgery

## 2011-05-23 VITALS — BP 118/70 | HR 80 | Temp 97.8°F | Resp 16 | Ht 71.0 in | Wt 190.0 lb

## 2011-05-23 DIAGNOSIS — N186 End stage renal disease: Secondary | ICD-10-CM

## 2011-05-23 NOTE — Progress Notes (Signed)
[facility] [place]  Postoperative Access Visit  History of Present Illness  [name] is a 78 y.o. year old male who presents for postoperative follow-up for: R 2nd stage BVT (Date: 04/14/11).  The patient's wounds are healed.  The patient notes no steal symptoms.  The patient is able to complete his activities of daily living.  The patient's current symptoms are: pulling sensation in surgical wound.  Physical Examination  Filed Vitals:   05/23/11 0901  BP: 118/70  Pulse: 80  Temp: 97.8 F (36.6 C)  Resp: 16   RUE: Incision is healed, no fluid collection in surgical site, skin feels warm, hand grip is 4-5/5, sensation in digits is intact, palpable thrill, bruit can be auscultated   Medical Decision Making  KIMBLE [name] is a 78 y.o. year old male who presents s/p R 2nd stage BVT.  The patient's access is ready for use.  Please note distal 1/3 of basilic vein was tied off as it was too small for use.    The patient's tunneled dialysis catheter can be removed after two successful cannulations and completed dialysis treatments.  Thank you for allowing [place] to participate in this patient's care.  Leonides Sake, MD [facility] [place] Office: [phone] Pager: [phone]

## 2011-06-30 ENCOUNTER — Other Ambulatory Visit: Payer: Self-pay

## 2011-07-02 ENCOUNTER — Other Ambulatory Visit (HOSPITAL_COMMUNITY): Payer: Self-pay | Admitting: Nephrology

## 2011-07-02 ENCOUNTER — Encounter (HOSPITAL_COMMUNITY): Payer: Medicare Other

## 2011-07-02 DIAGNOSIS — N186 End stage renal disease: Secondary | ICD-10-CM

## 2011-07-04 ENCOUNTER — Ambulatory Visit (HOSPITAL_COMMUNITY)
Admission: RE | Admit: 2011-07-04 | Discharge: 2011-07-04 | Disposition: A | Discharge status: Home / Self Care | Payer: Medicare Other | Source: Ambulatory Visit | Attending: Nephrology | Admitting: Nephrology

## 2011-07-04 DIAGNOSIS — N186 End stage renal disease: Secondary | ICD-10-CM

## 2011-07-04 DIAGNOSIS — Z4901 Encounter for fitting and adjustment of extracorporeal dialysis catheter: Secondary | ICD-10-CM | POA: Insufficient documentation

## 2011-07-04 MED ORDER — CHLORHEXIDINE GLUCONATE 4 % EX LIQD
CUTANEOUS | Status: AC
  Filled 2011-07-04: qty 30

## 2011-07-04 NOTE — Procedures (Signed)
Successful removal of dialysis catheter.  No immediate complications. 

## 2011-07-08 ENCOUNTER — Telehealth (HOSPITAL_COMMUNITY): Payer: Self-pay

## 2012-07-05 ENCOUNTER — Other Ambulatory Visit: Payer: Self-pay | Admitting: Gastroenterology

## 2012-07-05 DIAGNOSIS — R1110 Vomiting, unspecified: Secondary | ICD-10-CM

## 2012-07-13 ENCOUNTER — Other Ambulatory Visit: Payer: Medicare Other

## 2012-07-16 ENCOUNTER — Inpatient Hospital Stay: Admission: RE | Admit: 2012-07-16 | Payer: Medicare Other | Source: Ambulatory Visit

## 2012-07-28 ENCOUNTER — Ambulatory Visit
Admission: RE | Admit: 2012-07-28 | Discharge: 2012-07-28 | Disposition: A | Discharge status: Home / Self Care | Payer: Medicare Other | Source: Ambulatory Visit | Attending: Gastroenterology | Admitting: Gastroenterology

## 2012-07-28 DIAGNOSIS — R1110 Vomiting, unspecified: Secondary | ICD-10-CM

## 2012-08-03 ENCOUNTER — Other Ambulatory Visit (HOSPITAL_COMMUNITY): Payer: Self-pay | Admitting: Gastroenterology

## 2012-08-03 DIAGNOSIS — R1110 Vomiting, unspecified: Secondary | ICD-10-CM

## 2012-08-13 ENCOUNTER — Encounter (HOSPITAL_COMMUNITY)
Admission: RE | Admit: 2012-08-13 | Discharge: 2012-08-13 | Disposition: A | Discharge status: Home / Self Care | Payer: Medicare Other | Source: Ambulatory Visit | Attending: Gastroenterology | Admitting: Gastroenterology

## 2012-08-13 DIAGNOSIS — R1110 Vomiting, unspecified: Secondary | ICD-10-CM | POA: Insufficient documentation

## 2012-08-13 MED ORDER — TECHNETIUM TC 99M SULFUR COLLOID: 2.0000 | Freq: Once | INTRAVENOUS | Status: AC | PRN

## 2012-08-13 MED ORDER — TECHNETIUM TC 99M SULFUR COLLOID
2.0000 | Freq: Once | INTRAVENOUS | Status: AC | PRN
  Administered 2012-08-13: 2 via INTRAVENOUS

## 2012-12-02 ENCOUNTER — Non-Acute Institutional Stay (SKILLED_NURSING_FACILITY): Payer: Medicare Other | Admitting: Internal Medicine

## 2012-12-02 ENCOUNTER — Encounter: Payer: Self-pay | Admitting: Internal Medicine

## 2012-12-02 DIAGNOSIS — F329 Major depressive disorder, single episode, unspecified: Secondary | ICD-10-CM | POA: Insufficient documentation

## 2012-12-02 DIAGNOSIS — R531 Weakness: Secondary | ICD-10-CM

## 2012-12-02 DIAGNOSIS — R5383 Other fatigue: Secondary | ICD-10-CM

## 2012-12-02 DIAGNOSIS — N186 End stage renal disease: Secondary | ICD-10-CM

## 2012-12-02 NOTE — Progress Notes (Signed)
Patient ID: [name], male   DOB: October 26, 1931, 79 y.o.   MRN: [id number]   Code Status full code  No Known Allergies  Chief Complaint  Patient presents with  . Medical Managment of Chronic Issues   HPI:  79 y/o male patient is a long term resident of the facility. He has ESRD and get 3/week dialysis. Since yesterday, staff have noticed him to be lethargic and was weak to hold his sandwich. He was fine today. He refused to talk with me in the beginning but was later talking minimally. He denies any complaints. He appears to have some confusion. Unable to provide ROS due to confusion. Not following commands for me but able to follow commands for nursing staff  Review of Systems:  Denies fever or chills Denies chest pain or SOB Denies headache, nausea or vomiting Denies any recent falls/ trauma  Past Medical History  Diagnosis Date  . Diabetes mellitus   . Hypertension   . Arthritis   . Cataract   . CAD (coronary artery disease)   . Secondary hyperthyroidism   . Dementia   . History of thrombocytopenia   . GERD (gastroesophageal reflux disease)   . Anxiety     paranoia  . Stroke   . Chronic kidney disease     Tue, Thurs, Sat dialysis, goes to [facility]   Past Surgical History  Procedure Laterality Date  . Arteriovenous graft placement  05/01/10    Left upper arm brachial artery to axillary vein AVG  . Thrombectomy / arteriovenous graft revision  11/07/10    Left upper arm AVG  by Dr. Imogene Burn  . Diatek catheter insertion  12/05/10    Left side  . Bone spur      foot surgery  . Av fistula placement  04/14/2011    Procedure: ARTERIOVENOUS (AV) FISTULA CREATION;  Surgeon: Nilda Simmer, MD;  Location: [facility] OR;  Service: Vascular;  Laterality: Right;   Second  stage basilic vein transposition   Social History:   reports that he has never smoked. He has never used smokeless tobacco. He reports that  drinks alcohol. He reports that he does not use illicit  drugs.  History reviewed. No pertinent family history.  Medications: Patient's Medications  New Prescriptions   No medications on file  Previous Medications   B COMPLEX-C-BIOTIN-E-FA (RENATABS) 1 MG TABS    Take 1 mg by mouth daily.     BISACODYL (DULCOLAX) 10 MG SUPPOSITORY    Place 10 mg rectally as needed for constipation.   CALCIUM ACETATE (PHOSLO) 667 MG CAPSULE    Take 667 mg by mouth 3 (three) times daily with meals. Also 2 caps with snacks.   CINACALCET (SENSIPAR) 90 MG TABLET    Take 90 mg by mouth daily.    DOCUSATE SODIUM (COLACE) 100 MG CAPSULE    Take 200 mg by mouth at bedtime.    ESCITALOPRAM (LEXAPRO) 10 MG TABLET    Take 10 mg by mouth daily.   LORAZEPAM (ATIVAN) 1 MG TABLET    Take 0.5 mg by mouth every 4 (four) hours as needed. Tues, Thurs, Sat prior to dialysis   MENTHOL-ZINC OXIDE (REMEDY CALAZIME) 0.2-20 % PSTE    Apply 1 application topically daily.     NUTRITIONAL SUPPLEMENTS (NEPRO PO)    Take by mouth 3 (three) times daily.     OMEPRAZOLE (PRILOSEC) 20 MG CAPSULE    Take 20 mg by mouth daily.   OXYCODONE (OXY-IR)  5 MG CAPSULE    Take 5 mg by mouth every 4 (four) hours as needed. For a pain scale of 1-5    OXYCODONE HCL 10 MG TABS    Take 20 mg by mouth every 4 (four) hours as needed. For pain scale of 6-10   RISPERIDONE (RISPERDAL) 0.5 MG TABLET    Take 0.5 mg by mouth daily. 0.5 mg daily at bedtime and half a tab 0.25 mg M/W/F on dialysis day   SIMVASTATIN (ZOCOR) 20 MG TABLET    Take 20 mg by mouth at bedtime.    SUCROFERRIC OXYHYDROXIDE (VELPHORO) 500 MG CHEW    Chew 2 tablets by mouth 3 (three) times daily with meals.  Modified Medications   No medications on file  Discontinued Medications   LANTHANUM (FOSRENOL) 1000 MG CHEWABLE TABLET    Chew 2,000 mg by mouth 3 (three) times daily with meals.    LORAZEPAM (ATIVAN) 0.5 MG TABLET    Take 0.5 mg by mouth every 4 (four) hours as needed. For anxiety   POLYETHYLENE GLYCOL (MIRALAX / GLYCOLAX) PACKET    Take 17 g  by mouth daily as needed. constipation   QUETIAPINE (SEROQUEL) 50 MG TABLET    Take 50 mg by mouth daily.    RANITIDINE (ZANTAC) 75 MG TABLET    Take 150 mg by mouth at bedtime.   SORBITOL 70 % SOLUTION    Take 30 mLs by mouth daily as needed. constipation      Physical Exam: Filed Vitals:   12/02/12 1646  BP: 130/74  Pulse: 78  Temp: 98.3 F (36.8 C)  Resp: 18  Height: 5\' 6"  (1.676 m)  Weight: 211 lb (95.709 kg)  SpO2: 96%   Physical Exam  Constitutional: He appears well-developed. No distress.  HENT:  Head: Normocephalic and atraumatic.  Mouth/Throat: Oropharynx is clear and moist.  Eyes: EOM are normal. Pupils are equal, round, and reactive to light.  Neck: Normal range of motion. Neck supple.  Cardiovascular: Normal rate, regular rhythm and normal heart sounds.   Pulmonary/Chest: Effort normal and breath sounds normal. No respiratory distress. He has no wheezes. He has no rales.  Abdominal: Soft. Bowel sounds are normal. He exhibits no distension. There is no tenderness.  Musculoskeletal: He exhibits no edema.  Decreased strength and not following ROM command for his upper extremities  Lymphadenopathy:    He has no cervical adenopathy.  Neurological: He is alert.  Oriented to person  Skin: Skin is warm and dry. He is not diaphoretic.  Psychiatric:  Flat affect, tearful     Labs reviewed:  6/14 a1c 5.4 4/14 flp wnl  Assessment/Plan  Generalized weakness- with weakness in UE> LE, pt not co-operating with exam today. No clinical distribution to fit a stroke picture. Will have him work with PT/OT for muscle strengthening. Check cbc with diff to rule out infection and cmp to assess liver and rena function. Monitor clinically   ESRD- on dialysis 3/week. Continue phoslo, velphoro and sensipar  Depression- persists, will increase lexapro to 20 mg daily and continue ativan for now. Also continue risperidone  Labs/tests ordered- cbc, cmp

## 2012-12-28 ENCOUNTER — Non-Acute Institutional Stay (SKILLED_NURSING_FACILITY): Payer: Medicare Other | Admitting: Internal Medicine

## 2012-12-28 ENCOUNTER — Encounter: Payer: Self-pay | Admitting: Internal Medicine

## 2012-12-28 DIAGNOSIS — E8770 Fluid overload, unspecified: Secondary | ICD-10-CM

## 2012-12-28 DIAGNOSIS — Z992 Dependence on renal dialysis: Secondary | ICD-10-CM

## 2012-12-28 DIAGNOSIS — N186 End stage renal disease: Secondary | ICD-10-CM

## 2012-12-28 DIAGNOSIS — E8779 Other fluid overload: Secondary | ICD-10-CM

## 2012-12-28 NOTE — Progress Notes (Signed)
Patient ID: [name], male   DOB: 03-Sep-1931, 79 y.o.   MRN: [id number] Location:  [facility] SN[facility] Provider:  Gwenith Spitz. Renato Gails, D.O., C.M.D.  Code Status: full code  Chief Complaint  Patient presents with  . Acute Visit    increased edema in upper and lower extremities    HPI:   79 yo male with h/o ESRD on HD, secondary hyperparathyroidism, anxiety, chronic pain, GERD, constipation, vascular dementia and hyperlipidemia was seen for an acute visit due to increased edema in his upper and lower extremities.  He has received additional dialysis 2x in the past week due to his edema.  We discussed his diet which is currently consistent carbohydrate dialysis diet.  He does eat sausage biscuits frequently with high sodium content.  He does not add salt to his food, he says, but he does like the taste of salt.  He admits to orthopnea some nights recently.  He has PND, also.  His edema is worse especially in his legs and right arm.  He just had HD this morning.  He does not feel short of breath at the moment and has stable VS.  Review of Systems:  Review of Systems  Constitutional: Positive for malaise/fatigue. Negative for fever and chills.  HENT:       Rhinitis  Eyes: Positive for blurred vision.  Respiratory: Positive for shortness of breath.   Cardiovascular: Positive for orthopnea, leg swelling and PND. Negative for chest pain, palpitations and claudication.  Gastrointestinal: Positive for heartburn and constipation. Negative for abdominal pain, blood in stool and melena.  Genitourinary: Negative for dysuria.  Musculoskeletal: Positive for joint pain.  Neurological: Negative for dizziness and headaches.  Psychiatric/Behavioral: Positive for memory loss.    Medications: Patient's Medications  New Prescriptions   No medications on file  Previous Medications   B COMPLEX-C-BIOTIN-E-FA (RENATABS) 1 MG TABS    Take 1 mg by mouth daily.     BISACODYL (DULCOLAX) 10 MG  SUPPOSITORY    Place 10 mg rectally as needed for constipation.   CALCIUM ACETATE (PHOSLO) 667 MG CAPSULE    Take 667 mg by mouth 3 (three) times daily with meals. Also 2 caps with snacks.   CINACALCET (SENSIPAR) 90 MG TABLET    Take 90 mg by mouth daily.    DOCUSATE SODIUM (COLACE) 100 MG CAPSULE    Take 200 mg by mouth at bedtime.    ESCITALOPRAM (LEXAPRO) 10 MG TABLET    Take 10 mg by mouth daily.   LORAZEPAM (ATIVAN) 1 MG TABLET    Take 0.5 mg by mouth every 4 (four) hours as needed. Tues, Thurs, Sat prior to dialysis   MENTHOL-ZINC OXIDE (REMEDY CALAZIME) 0.2-20 % PSTE    Apply 1 application topically daily.     NUTRITIONAL SUPPLEMENTS (NEPRO PO)    Take by mouth 3 (three) times daily.     OMEPRAZOLE (PRILOSEC) 20 MG CAPSULE    Take 20 mg by mouth daily.   OXYCODONE (OXY-IR) 5 MG CAPSULE    Take 5 mg by mouth every 4 (four) hours as needed. For a pain scale of 1-5    OXYCODONE HCL 10 MG TABS    Take 20 mg by mouth every 4 (four) hours as needed. For pain scale of 6-10   RISPERIDONE (RISPERDAL) 0.5 MG TABLET    Take 0.5 mg by mouth daily. 0.5 mg daily at bedtime and half a tab 0.25 mg M/W/F on dialysis day   SIMVASTATIN (  ZOCOR) 20 MG TABLET    Take 20 mg by mouth at bedtime.    SUCROFERRIC OXYHYDROXIDE (VELPHORO) 500 MG CHEW    Chew 2 tablets by mouth 3 (three) times daily with meals.  Modified Medications   No medications on file  Discontinued Medications   No medications on file    Physical Exam: Filed Vitals:   12/28/12 1746  BP: 112/76  Pulse: 55  Temp: 98.4 F (36.9 C)  Resp: 18  Height: 5\' 6"  (1.676 m)  Weight: 206 lb (93.441 kg)  SpO2: 96%   Physical Exam  Constitutional: He appears well-developed and well-nourished. No distress.  HENT:  Head: Normocephalic and atraumatic.  Neck: No JVD present. Thyromegaly present.  At 90 degrees in wheelchair  Cardiovascular: Normal rate, regular rhythm and normal heart sounds.   Pulmonary/Chest: Effort normal. No respiratory  distress.  Diminished breath sounds at the bases with some rales present posteriorly  Abdominal: Soft. Bowel sounds are normal.  Musculoskeletal: He exhibits edema.  3+ of b/l LE, 2+ RUE  Neurological: He is alert.  Oriented to person only, thinks he is at home with his mom who cooks him biscuits  Skin: Skin is warm and dry.  Dressing in place on right upper extremity at HD site  Psychiatric: He has a normal mood and affect.    Assessment/Plan 1. Volume overload --has fluid retention in legs and arms --will ensure diet is no added sodium, also --he does admit to drinking water, but says he does not drink much --has required extra HD treatments this week and had a period of extreme weakness where he was seen by my colleague, but I do not see the labs from that visit --will check echo to assess cardiac function b/c I do not see where this has ever been done --did discuss diet with him, but he has vascular dementia and likely will not recall --will need to make sure staff are not providing excess salty snacks and fluids, but also not depriving him of a good quality of life  2. End stage renal disease on dialysis --had HD this am, but still appears volume overloaded --I wonder if he didn't have a cardiac event recently and now has diminished cardiac function, as well  Family/ staff Communication: pt seen with unit supervisor  Goals of care: full code  Labs/studies ordered:  echocardiogram

## 2013-01-11 ENCOUNTER — Non-Acute Institutional Stay (SKILLED_NURSING_FACILITY): Payer: Medicare Other | Admitting: Internal Medicine

## 2013-01-11 ENCOUNTER — Encounter: Payer: Self-pay | Admitting: Internal Medicine

## 2013-01-11 DIAGNOSIS — R5383 Other fatigue: Secondary | ICD-10-CM

## 2013-01-11 DIAGNOSIS — R5381 Other malaise: Secondary | ICD-10-CM

## 2013-01-11 NOTE — Progress Notes (Signed)
Patient ID: [name], male   DOB: 31-Dec-1931, 79 y.o.   MRN: [id number] Location:  [facility] SN[facility] Provider:  Gwenith Spitz. Renato Gails, D.O., C.M.D.  Code Status:  Full code  Chief Complaint  Patient presents with  . Acute Visit    increased lethargy, ? reduce risperdal    HPI:  79 yo male here for long term care seen due to increased lethargy.  He is pleasantly confused.  Attends HD for ESRD.    Review of Systems:  Review of Systems  Constitutional: Negative for fever.  Eyes: Positive for blurred vision.  Respiratory: Negative for shortness of breath.   Cardiovascular: Negative for chest pain.  Gastrointestinal: Negative for abdominal pain.  Genitourinary: Negative for flank pain.  Musculoskeletal: Negative for falls and joint pain.  Neurological: Negative for dizziness and headaches.  Endo/Heme/Allergies: Bruises/bleeds easily.  Psychiatric/Behavioral: Positive for memory loss.    Medications: Patient's Medications  New Prescriptions   No medications on file  Previous Medications   B COMPLEX-C-BIOTIN-E-FA (RENATABS) 1 MG TABS    Take 1 mg by mouth daily.     BISACODYL (DULCOLAX) 10 MG SUPPOSITORY    Place 10 mg rectally as needed for constipation.   CALCIUM ACETATE (PHOSLO) 667 MG CAPSULE    Take 667 mg by mouth 3 (three) times daily with meals. Also 2 caps with snacks.   CINACALCET (SENSIPAR) 90 MG TABLET    Take 90 mg by mouth daily.    DOCUSATE SODIUM (COLACE) 100 MG CAPSULE    Take 200 mg by mouth at bedtime.    ESCITALOPRAM (LEXAPRO) 10 MG TABLET    Take 10 mg by mouth daily.   LORAZEPAM (ATIVAN) 1 MG TABLET    Take 0.5 mg by mouth every 4 (four) hours as needed. Tues, Thurs, Sat prior to dialysis   MENTHOL-ZINC OXIDE (REMEDY CALAZIME) 0.2-20 % PSTE    Apply 1 application topically daily.     NUTRITIONAL SUPPLEMENTS (NEPRO PO)    Take by mouth 3 (three) times daily.     OMEPRAZOLE (PRILOSEC) 20 MG CAPSULE    Take 20 mg by mouth daily.   OXYCODONE (OXY-IR) 5  MG CAPSULE    Take 5 mg by mouth every 4 (four) hours as needed. For a pain scale of 1-5    OXYCODONE HCL 10 MG TABS    Take 20 mg by mouth every 4 (four) hours as needed. For pain scale of 6-10   RISPERIDONE (RISPERDAL) 0.5 MG TABLET    Take 0.5 mg by mouth daily. 0.5 mg daily at bedtime and half a tab 0.25 mg M/W/F on dialysis day   SIMVASTATIN (ZOCOR) 20 MG TABLET    Take 20 mg by mouth at bedtime.    SUCROFERRIC OXYHYDROXIDE (VELPHORO) 500 MG CHEW    Chew 2 tablets by mouth 3 (three) times daily with meals.  Modified Medications   No medications on file  Discontinued Medications   No medications on file    Physical Exam: Filed Vitals:   01/11/13 1156  BP: 100/64  Pulse: 68  Temp: 97.1 F (36.2 C)  Resp: 24  Height: 5\' 6"  (1.676 m)  Weight: 206 lb (93.441 kg)  SpO2: 96%  Physical Exam  Constitutional:  Obese black male nad  Cardiovascular: Normal rate, regular rhythm and normal heart sounds.   Right AV fistula with normal thrill and bruit  Pulmonary/Chest: Effort normal and breath sounds normal. No respiratory distress.  Abdominal: Soft. Bowel sounds are  normal. He exhibits no distension and no mass. There is no tenderness.  Musculoskeletal: Normal range of motion.  Neurological: He is alert.  Skin: Skin is warm and dry.  Psychiatric: He has a normal mood and affect.   Assessment/Plan: 1. Lethargy stop daily risperdal, use only at bedtime and on hd days

## 2013-01-26 ENCOUNTER — Non-Acute Institutional Stay (SKILLED_NURSING_FACILITY): Payer: Medicare Other | Admitting: Adult Health

## 2013-01-26 DIAGNOSIS — N2581 Secondary hyperparathyroidism of renal origin: Secondary | ICD-10-CM

## 2013-01-26 DIAGNOSIS — K219 Gastro-esophageal reflux disease without esophagitis: Secondary | ICD-10-CM

## 2013-01-26 DIAGNOSIS — F329 Major depressive disorder, single episode, unspecified: Secondary | ICD-10-CM

## 2013-01-26 DIAGNOSIS — E785 Hyperlipidemia, unspecified: Secondary | ICD-10-CM

## 2013-01-26 DIAGNOSIS — N186 End stage renal disease: Secondary | ICD-10-CM

## 2013-01-26 DIAGNOSIS — K5900 Constipation, unspecified: Secondary | ICD-10-CM

## 2013-02-14 MED ORDER — RISPERIDONE 0.5 MG PO TABS: 0.5000 mg | ORAL_TABLET | Freq: Every day | ORAL | Status: DC

## 2013-02-21 ENCOUNTER — Encounter: Payer: Self-pay | Admitting: Adult Health

## 2013-02-21 DIAGNOSIS — N2581 Secondary hyperparathyroidism of renal origin: Secondary | ICD-10-CM | POA: Insufficient documentation

## 2013-02-21 DIAGNOSIS — K5900 Constipation, unspecified: Secondary | ICD-10-CM | POA: Insufficient documentation

## 2013-02-21 DIAGNOSIS — K219 Gastro-esophageal reflux disease without esophagitis: Secondary | ICD-10-CM | POA: Insufficient documentation

## 2013-02-21 NOTE — Assessment & Plan Note (Signed)
There is no change in status; he remains on dialysis three days per week; 1200 cc fluid restriction; phoslo 667 mg three times daily and with snacks; velphoro 1 gm three times daily and renatab daily; will not make changes will continue to monitor his status

## 2013-02-21 NOTE — Assessment & Plan Note (Signed)
Will continue colace 200 mg nightly

## 2013-02-21 NOTE — Assessment & Plan Note (Signed)
Will continue zocor 20 mg daily  

## 2013-02-21 NOTE — Assessment & Plan Note (Signed)
Is followed by nephrology is taking sensipar 180 mg daily; will not make changes will monitor his status

## 2013-02-21 NOTE — Assessment & Plan Note (Signed)
Will continue prilosec 20 mg daily  

## 2013-02-21 NOTE — Assessment & Plan Note (Addendum)
His emotional status is doing well; will continue lexapro 20 mg daily has ativan 0.5 mg every 4 hours as needed for anxiety; will continue his risperdal 0.25 nightly and 0.25 mg on dialysis and will monitor his status

## 2013-02-21 NOTE — Progress Notes (Signed)
Patient ID: [name], male   DOB: Jan 03, 1932, 79 y.o.   MRN: [id number]  [facility]  No Known Allergies   Chief Complaint  Patient presents with  . Medical Managment of Chronic Issues    HPI: He is being seen for the management of chronic illnesses. He got angry this morning with staff members; he tells me that he know he shouldn't do this but there are time when he just can't help it. There are no concerns being voiced by the nursing staff at this time. Overall his status remains without significant change.    Past Medical History  Diagnosis Date  . Diabetes mellitus   . Hypertension   . Arthritis   . Cataract   . CAD (coronary artery disease)   . Secondary hyperthyroidism   . Dementia   . History of thrombocytopenia   . GERD (gastroesophageal reflux disease)   . Anxiety     paranoia  . Stroke   . Chronic kidney disease     Tue, Thurs, Sat dialysis, goes to [facility]    Past Surgical History  Procedure Laterality Date  . Arteriovenous graft placement  05/01/10    Left upper arm brachial artery to axillary vein AVG  . Thrombectomy / arteriovenous graft revision  11/07/10    Left upper arm AVG  by Dr. Imogene Burn  . Diatek catheter insertion  12/05/10    Left side  . Bone spur      foot surgery  . Av fistula placement  04/14/2011    Procedure: ARTERIOVENOUS (AV) FISTULA CREATION;  Surgeon: Nilda Simmer, MD;  Location: [facility] OR;  Service: Vascular;  Laterality: Right;   Second  stage basilic vein transposition    VITAL SIGNS BP 114/54  Pulse 63  Ht 5\' 6"  (1.676 m)  Wt 204 lb (92.534 kg)  BMI 32.94 kg/m2   Patient's Medications  New Prescriptions   No medications on file  Previous Medications   B COMPLEX-C-BIOTIN-E-FA (RENATABS) 1 MG TABS    Take 1 mg by mouth daily.     BISACODYL (DULCOLAX) 10 MG SUPPOSITORY    Place 10 mg rectally as needed for constipation.   CALCIUM ACETATE (PHOSLO) 667 MG CAPSULE    Take 667 mg by mouth 3 (three) times daily  with meals. Also 2 caps with snacks.   CINACALCET (SENSIPAR) 90 MG TABLET    Take 180 mg by mouth daily.    DOCUSATE SODIUM (COLACE) 100 MG CAPSULE    Take 200 mg by mouth at bedtime.    ESCITALOPRAM (LEXAPRO) 10 MG TABLET    Take 20 mg by mouth daily.    LORAZEPAM (ATIVAN) 1 MG TABLET    Take 0.5 mg by mouth every 4 (four) hours as needed. Tues, Thurs, Sat prior to dialysis   OMEPRAZOLE (PRILOSEC) 20 MG CAPSULE    Take 20 mg by mouth daily.   OXYCODONE (OXY-IR) 5 MG CAPSULE    Take 5 mg by mouth every 12 (twelve) hours as needed. For a pain scale of 1-5   SIMVASTATIN (ZOCOR) 20 MG TABLET    Take 20 mg by mouth at bedtime.    SUCROFERRIC OXYHYDROXIDE (VELPHORO) 500 MG CHEW    Chew 2 tablets by mouth 3 (three) times daily with meals.  Modified Medications   Modified Medication Previous Medication   RISPERIDONE (RISPERDAL) 0.5 MG TABLET risperiDONE (RISPERDAL) 0.5 MG tablet      Take 0.25 mg by mouth at bedtime. and  0.25 mg M/W/F on dialysis day    Take 1 tablet (0.5 mg total) by mouth at bedtime. and half a tab 0.25 mg M/W/F on dialysis day  Discontinued Medications   No medications on file    SIGNIFICANT DIAGNOSTIC EXAMS  12-30-12: 2-d echo: ef 50-55%; mild left ventricular hypertrophy    LABS REVIEWED:   11-27-12: wbc 7.79; hgb 11.8; hct 35.4 mcv 91; plt 116; glucose 102; creat 9.1; k+ 4.6; mag 2.0;  phos 6.2; pth 668.2; albumin 3.5; iron 3; tibc 171; hgb a1c 5.4     Review of Systems  Constitutional: Negative for malaise/fatigue.  Respiratory: Negative for cough.   Cardiovascular: Negative for chest pain.  Musculoskeletal: Negative for myalgias.  Skin: Negative.   Neurological: Negative for headaches.  Psychiatric/Behavioral: The patient is not nervous/anxious.     Physical Exam  Constitutional: He appears well-developed and well-nourished.  overweight  Neck: Neck supple. No JVD present. No thyromegaly present.  Cardiovascular: Normal rate and regular rhythm.    Respiratory: Effort normal and breath sounds normal. No respiratory distress. He has no wheezes.  GI: Soft. Bowel sounds are normal. He exhibits no distension. There is no tenderness.  Musculoskeletal: Normal range of motion. He exhibits no edema.  Neurological: He is alert.  Skin: Skin is warm and dry.  Psychiatric: He has a normal mood and affect.     ASSESSMENT/ PLAN:   End stage renal disease on dialysis There is no change in status; he remains on dialysis three days per week; 1200 cc fluid restriction; phoslo 667 mg three times daily and with snacks; velphoro 1 gm three times daily and renatab daily; will not make changes will continue to monitor his status   HYPERLIPIDEMIA-MIXED Will continue zocor 20 mg daily   Depression His emotional status is doing well; will continue lexapro 20 mg daily has ativan 0.5 mg every 4 hours as needed for anxiety; will continue his risperdal 0.25 nightly and 0.25 mg on dialysis and will monitor his status   GERD (gastroesophageal reflux disease) Will continue prilosec 20 mg daily   Constipation Will continue colace 200 mg nightly   Hyperparathyroidism, secondary renal Is followed by nephrology is taking sensipar 180 mg daily; will not make changes will monitor his status     Time spent with patient 50 minutes.

## 2013-03-02 ENCOUNTER — Non-Acute Institutional Stay (SKILLED_NURSING_FACILITY): Payer: Medicare Other | Admitting: Adult Health

## 2013-03-02 DIAGNOSIS — N2581 Secondary hyperparathyroidism of renal origin: Secondary | ICD-10-CM

## 2013-03-02 DIAGNOSIS — F329 Major depressive disorder, single episode, unspecified: Secondary | ICD-10-CM

## 2013-03-02 DIAGNOSIS — E785 Hyperlipidemia, unspecified: Secondary | ICD-10-CM

## 2013-03-02 DIAGNOSIS — N186 End stage renal disease: Secondary | ICD-10-CM

## 2013-03-02 DIAGNOSIS — K219 Gastro-esophageal reflux disease without esophagitis: Secondary | ICD-10-CM

## 2013-03-02 DIAGNOSIS — K5900 Constipation, unspecified: Secondary | ICD-10-CM

## 2013-03-07 ENCOUNTER — Encounter: Payer: Self-pay | Admitting: Adult Health

## 2013-03-07 NOTE — Progress Notes (Signed)
Patient ID: [name], male   DOB: 1931-05-22, 79 y.o.   MRN: [id number]  [facility]  No Known Allergies   Chief Complaint  Patient presents with  . Medical Managment of Chronic Issues    HPI: He is bieng seen for the management of his chronic illnesses. Overall his status remains unchanged. He continues to be on hemodialysis three days per week. He has swelling in his left upper arm and has no thrill or bruit in his a/v fistula. He has seen the vascular surgeon; I do not have that report in the chart at this time. Nursing staff will inform hemodialysis of the lack of thrill and bruit.    Past Medical History  Diagnosis Date  . Diabetes mellitus   . Hypertension   . Arthritis   . Cataract   . CAD (coronary artery disease)   . Secondary hyperthyroidism   . Dementia   . History of thrombocytopenia   . GERD (gastroesophageal reflux disease)   . Anxiety     paranoia  . Stroke   . Chronic kidney disease     Tue, Thurs, Sat dialysis, goes to [facility] street    Past Surgical History  Procedure Laterality Date  . Arteriovenous graft placement  05/01/10    Left upper arm brachial artery to axillary vein AVG  . Thrombectomy / arteriovenous graft revision  11/07/10    Left upper arm AVG  by Dr. Imogene Burn  . Diatek catheter insertion  12/05/10    Left side  . Bone spur      foot surgery  . Av fistula placement  04/14/2011    Procedure: ARTERIOVENOUS (AV) FISTULA CREATION;  Surgeon: Nilda Simmer, MD;  Location: [facility] OR;  Service: Vascular;  Laterality: Right;   Second  stage basilic vein transposition    VITAL SIGNS BP 103/50  Pulse 85  Ht 5\' 6"  (1.676 m)  Wt 213 lb (96.616 kg)  BMI 34.4 kg/m2   Patient's Medications  New Prescriptions   No medications on file  Previous Medications   B COMPLEX-C-BIOTIN-E-FA (RENATABS) 1 MG TABS    Take 1 mg by mouth daily.     BISACODYL (DULCOLAX) 10 MG SUPPOSITORY    Place 10 mg rectally as needed for constipation.   CALCIUM  ACETATE (PHOSLO) 667 MG CAPSULE    Take 667 mg by mouth 3 (three) times daily with meals. Also 2 caps with snacks.   CINACALCET (SENSIPAR) 90 MG TABLET    Take 180 mg by mouth daily.    DOCUSATE SODIUM (COLACE) 100 MG CAPSULE    Take 200 mg by mouth at bedtime.    ESCITALOPRAM (LEXAPRO) 10 MG TABLET    Take 20 mg by mouth daily.    LORAZEPAM (ATIVAN) 1 MG TABLET    Take 0.5 mg by mouth every 4 (four) hours as needed. Tues, Thurs, Sat prior to dialysis   OMEPRAZOLE (PRILOSEC) 20 MG CAPSULE    Take 20 mg by mouth daily.   OXYCODONE (OXY-IR) 5 MG CAPSULE    Take 5 mg by mouth every 12 (twelve) hours as needed. For a pain scale of 1-5   RISPERIDONE (RISPERDAL) 0.5 MG TABLET    Take 0.25 mg by mouth at bedtime. and  0.25 mg M/W/F on dialysis day   SIMVASTATIN (ZOCOR) 20 MG TABLET    Take 20 mg by mouth at bedtime.    SUCROFERRIC OXYHYDROXIDE (VELPHORO) 500 MG CHEW    Chew 2 tablets by mouth  3 (three) times daily with meals.  Modified Medications   No medications on file  Discontinued Medications   No medications on file    SIGNIFICANT DIAGNOSTIC EXAMS    12-30-12: 2-d echo: ef 50-55%; mild left ventricular hypertrophy    LABS REVIEWED:   11-27-12: wbc 7.79; hgb 11.8; hct 35.4 mcv 91; plt 116; glucose 102; creat 9.1; k+ 4.6; mag 2.0;  phos 6.2; pth 668.2; albumin 3.5; iron 3; tibc 171; hgb a1c 5.4  01-21-13: hgb a1c 5.7     Review of Systems  Constitutional: Negative for malaise/fatigue.  Respiratory: Negative for cough.   Cardiovascular: Negative for chest pain.  Musculoskeletal: Negative for myalgias.  Skin: Negative.   Neurological: Negative for headaches.  Psychiatric/Behavioral: The patient is not nervous/anxious.     Physical Exam  Constitutional: He appears well-developed and well-nourished.  overweight  Neck: Neck supple. No JVD present. No thyromegaly present.  Cardiovascular: Normal rate and regular rhythm.  NO THRILL OR BRUIT IN LEFT ARM A/V SHUNT Respiratory: Effort  normal and breath sounds normal. No respiratory distress. He has no wheezes.  GI: Soft. Bowel sounds are normal. He exhibits no distension. There is no tenderness.  Musculoskeletal: Normal range of motion. He exhibits no edema.  Neurological: He is alert.  Skin: Skin is warm and dry.  Psychiatric: He has a normal mood and affect.     ASSESSMENT/ PLAN:   End stage renal disease on dialysis There is no change in status; he remains on dialysis three days per week; 1200 cc fluid restriction; phoslo 667 mg three times daily and with snacks; velphoro 1 gm three times daily and renatab daily; will not make changes will continue to monitor his status   HYPERLIPIDEMIA-MIXED Will continue zocor 20 mg daily   Depression His emotional status is doing well; will continue lexapro 20 mg daily has ativan 0.5 mg every 4 hours as needed for anxiety; will continue his risperdal 0.25 nightly and 0.25 mg on dialysis and will monitor his status   GERD (gastroesophageal reflux disease) Will continue prilosec 20 mg daily   Constipation Will continue colace 200 mg nightly   Hyperparathyroidism, secondary renal Is followed by nephrology is taking sensipar 180 mg daily; will not make changes will monitor his status

## 2013-03-10 ENCOUNTER — Non-Acute Institutional Stay (SKILLED_NURSING_FACILITY): Payer: Medicare Other | Admitting: Internal Medicine

## 2013-03-10 DIAGNOSIS — L89109 Pressure ulcer of unspecified part of back, unspecified stage: Secondary | ICD-10-CM

## 2013-03-10 DIAGNOSIS — L8992 Pressure ulcer of unspecified site, stage 2: Secondary | ICD-10-CM

## 2013-03-10 DIAGNOSIS — R0600 Dyspnea, unspecified: Secondary | ICD-10-CM

## 2013-03-10 DIAGNOSIS — R609 Edema, unspecified: Secondary | ICD-10-CM

## 2013-03-10 DIAGNOSIS — R600 Localized edema: Secondary | ICD-10-CM

## 2013-03-10 DIAGNOSIS — R0609 Other forms of dyspnea: Secondary | ICD-10-CM

## 2013-03-10 DIAGNOSIS — L89152 Pressure ulcer of sacral region, stage 2: Secondary | ICD-10-CM

## 2013-03-10 DIAGNOSIS — F329 Major depressive disorder, single episode, unspecified: Secondary | ICD-10-CM

## 2013-03-10 NOTE — Progress Notes (Signed)
Patient ID: [name], male   DOB: [birth year], 79 y.o.   MRN: [id number]  [name] living [employer]  Chief Complaint  Patient presents with  . Acute Visit    arm swelling, decreased po intake   No Known Allergies  HPI: 79 y/o male patient is seen for AV with worsening right arm swelling. He continues to be on hemodialysis three days per week. He has swelling in his right upper arm. Denies any pain but has discomfort. He also has been getting short of breath and unable to sleep at night.  Review of Systems  Constitutional: Negative for malaise/fatigue.  Respiratory: Negative for cough.   Cardiovascular: Negative for chest pain.  Musculoskeletal: Negative for myalgias.  Skin: Negative.   Neurological: Negative for headaches.  Psychiatric/Behavioral: The patient is not nervous/anxious.  he has been feeling low lately  Past Medical History  Diagnosis Date  . Diabetes mellitus   . Hypertension   . Arthritis   . Cataract   . CAD (coronary artery disease)   . Secondary hyperthyroidism   . Dementia   . History of thrombocytopenia   . GERD (gastroesophageal reflux disease)   . Anxiety     paranoia  . Stroke   . Chronic kidney disease     Tue, Thurs, Sat dialysis, goes to [address]    Medication reviewed. See MAR   Exam- BP 136/72  Pulse 58  Temp(Src) 97.9 F (36.6 C)  Resp 18  Ht 5\' 6"  (1.676 m)  Wt 213 lb (96.616 kg)  BMI 34.40 kg/m2  SpO2 96%  Constitutional: He appears well-developed and well-nourished.  overweight  Neck: Neck supple. No JVD present. No thyromegaly present.  Cardiovascular: Normal rate and regular rhythm.  NO THRILL OR BRUIT IN LEFT ARM A/V SHUNT Respiratory: Effort normal. decreased basilar air entry, no crackles or rhonchi appreciated. No respiratory distress. He has no wheezes.  GI: Soft. Bowel sounds are normal. He exhibits no distension. There is no tenderness.  Musculoskeletal: Normal range of motion. 2+ pitting edema of right arm,  radial pulse in right arm not palpable due to edema. No leg edema Neurological: He is alert.  Skin: Skin is warm and dry. Sacral wound stage 2 Psychiatric: He has a normal mood and affect.   Assessment/plan  Edema Likely from lymphedema given unilateral painless swelling. Will need to rule out DVT given the unilateral swelling. Has vascular appointment 03/14/13. Will check prelabumin level to assess for protein calorie malnutrition as well given his unhealing wound and this worsening edema  Dyspnea Chest xray to rule out vascular congestion given his weight gain and lung exam. No uri symptoms. No wheezing noted  depression Add seroquel 12.5 mg daily for now and reassess. Continue resperidal and celexa  Sacral decubitus Monitor nutritional status. Will add vitamin c and zinc supplement to help with wound healing. Continue wound care

## 2013-03-11 ENCOUNTER — Other Ambulatory Visit: Payer: Self-pay | Admitting: Internal Medicine

## 2013-03-11 DIAGNOSIS — R079 Chest pain, unspecified: Secondary | ICD-10-CM

## 2013-03-16 ENCOUNTER — Ambulatory Visit (HOSPITAL_COMMUNITY)
Admission: RE | Admit: 2013-03-16 | Discharge: 2013-03-16 | Disposition: A | Discharge status: Home / Self Care | Payer: Medicare Other | Source: Ambulatory Visit | Attending: Internal Medicine | Admitting: Internal Medicine

## 2013-03-16 DIAGNOSIS — E279 Disorder of adrenal gland, unspecified: Secondary | ICD-10-CM | POA: Insufficient documentation

## 2013-03-16 DIAGNOSIS — R0602 Shortness of breath: Secondary | ICD-10-CM | POA: Insufficient documentation

## 2013-03-16 DIAGNOSIS — R079 Chest pain, unspecified: Secondary | ICD-10-CM

## 2013-03-16 DIAGNOSIS — I517 Cardiomegaly: Secondary | ICD-10-CM | POA: Insufficient documentation

## 2013-03-16 DIAGNOSIS — R918 Other nonspecific abnormal finding of lung field: Secondary | ICD-10-CM | POA: Insufficient documentation

## 2013-03-16 DIAGNOSIS — I2510 Atherosclerotic heart disease of native coronary artery without angina pectoris: Secondary | ICD-10-CM | POA: Insufficient documentation

## 2013-03-16 DIAGNOSIS — J90 Pleural effusion, not elsewhere classified: Secondary | ICD-10-CM | POA: Insufficient documentation

## 2013-03-16 DIAGNOSIS — R091 Pleurisy: Secondary | ICD-10-CM | POA: Insufficient documentation

## 2013-03-21 ENCOUNTER — Encounter: Payer: Self-pay | Admitting: Podiatry

## 2013-03-21 ENCOUNTER — Ambulatory Visit (INDEPENDENT_AMBULATORY_CARE_PROVIDER_SITE_OTHER): Payer: Medicare Other | Admitting: Podiatry

## 2013-03-21 VITALS — BP 133/83 | HR 89 | Resp 20

## 2013-03-21 DIAGNOSIS — M79609 Pain in unspecified limb: Secondary | ICD-10-CM

## 2013-03-21 DIAGNOSIS — B351 Tinea unguium: Secondary | ICD-10-CM

## 2013-03-22 NOTE — Progress Notes (Signed)
Subjective:     Patient ID: [name], male   DOB: 1931-06-12, 79 y.o.   MRN: [id number]  HPI patient states my nails are thick and they are becoming increasingly tender   Review of Systems     Objective:   Physical Exam  Nursing note and vitals reviewed. Constitutional: He is oriented to person, place, and time.  Neurological: He is oriented to person, place, and time.  Skin: Skin is warm.   neurovascular status diminished bilateral and nail disease with thickness and discomfort both feet    Assessment:     Mycotic nail infection with pain 1-5 both feet    Plan:     Debridement nailbeds 1-5 both feet with no iatrogenic bleeding noted

## 2013-04-18 ENCOUNTER — Encounter (HOSPITAL_COMMUNITY): Payer: Self-pay | Admitting: Emergency Medicine

## 2013-04-18 ENCOUNTER — Emergency Department (HOSPITAL_COMMUNITY)
Admission: EM | Admit: 2013-04-18 | Discharge: 2013-04-18 | Disposition: A | Discharge status: Skilled Nursing Facility | Payer: Medicare Other | Attending: Emergency Medicine | Admitting: Emergency Medicine

## 2013-04-18 DIAGNOSIS — T82898A Other specified complication of vascular prosthetic devices, implants and grafts, initial encounter: Secondary | ICD-10-CM

## 2013-04-18 DIAGNOSIS — Z992 Dependence on renal dialysis: Secondary | ICD-10-CM | POA: Insufficient documentation

## 2013-04-18 DIAGNOSIS — T829XXA Unspecified complication of cardiac and vascular prosthetic device, implant and graft, initial encounter: Secondary | ICD-10-CM

## 2013-04-18 DIAGNOSIS — N186 End stage renal disease: Secondary | ICD-10-CM | POA: Insufficient documentation

## 2013-04-18 DIAGNOSIS — Z8673 Personal history of transient ischemic attack (TIA), and cerebral infarction without residual deficits: Secondary | ICD-10-CM | POA: Insufficient documentation

## 2013-04-18 DIAGNOSIS — IMO0002 Reserved for concepts with insufficient information to code with codable children: Secondary | ICD-10-CM | POA: Insufficient documentation

## 2013-04-18 DIAGNOSIS — Y841 Kidney dialysis as the cause of abnormal reaction of the patient, or of later complication, without mention of misadventure at the time of the procedure: Secondary | ICD-10-CM | POA: Insufficient documentation

## 2013-04-18 DIAGNOSIS — Z862 Personal history of diseases of the blood and blood-forming organs and certain disorders involving the immune mechanism: Secondary | ICD-10-CM | POA: Insufficient documentation

## 2013-04-18 DIAGNOSIS — K219 Gastro-esophageal reflux disease without esophagitis: Secondary | ICD-10-CM | POA: Insufficient documentation

## 2013-04-18 DIAGNOSIS — I120 Hypertensive chronic kidney disease with stage 5 chronic kidney disease or end stage renal disease: Secondary | ICD-10-CM | POA: Insufficient documentation

## 2013-04-18 DIAGNOSIS — F0390 Unspecified dementia without behavioral disturbance: Secondary | ICD-10-CM | POA: Insufficient documentation

## 2013-04-18 DIAGNOSIS — Z79899 Other long term (current) drug therapy: Secondary | ICD-10-CM | POA: Insufficient documentation

## 2013-04-18 DIAGNOSIS — M129 Arthropathy, unspecified: Secondary | ICD-10-CM | POA: Insufficient documentation

## 2013-04-18 DIAGNOSIS — Z8669 Personal history of other diseases of the nervous system and sense organs: Secondary | ICD-10-CM | POA: Insufficient documentation

## 2013-04-18 DIAGNOSIS — E119 Type 2 diabetes mellitus without complications: Secondary | ICD-10-CM | POA: Insufficient documentation

## 2013-04-18 DIAGNOSIS — I2510 Atherosclerotic heart disease of native coronary artery without angina pectoris: Secondary | ICD-10-CM | POA: Insufficient documentation

## 2013-04-18 LAB — POCT I-STAT, CHEM 8
BUN: 63 mg/dL — ABNORMAL HIGH (ref 6–23)
Calcium, Ion: 1.14 mmol/L (ref 1.13–1.30)
Chloride: 101 mEq/L (ref 96–112)
Creatinine, Ser: 7.4 mg/dL — ABNORMAL HIGH (ref 0.50–1.35)
Glucose, Bld: 100 mg/dL — ABNORMAL HIGH (ref 70–99)
HCT: 45 % (ref 39.0–52.0)
Hemoglobin: 15.3 g/dL (ref 13.0–17.0)
Potassium: 4.5 mEq/L (ref 3.5–5.1)
Sodium: 139 mEq/L (ref 135–145)

## 2013-04-18 LAB — CG4 I-STAT (LACTIC ACID): Lactic Acid, Venous: 1.3 mmol/L (ref 0.5–2.2)

## 2013-04-18 NOTE — ED Notes (Signed)
Pt provided meal, eating independently.

## 2013-04-18 NOTE — ED Provider Notes (Signed)
CSN: [id number]     Arrival date & time 04/18/13  1355 History   First MD Initiated Contact with Patient 04/18/13 1405     Chief Complaint  Patient presents with  . Vascular Access Problem    The history is provided by the patient and the EMS personnel.  pt presents from facility for AV fistula bleeding in his right UE Pt was at facility and it began to spontaneously bleed just prior to arrival He was not at dialysis Pt denies any other complaints - no cp/sob/weakness The bleeding was improved with pressure No trauma reported to arm.    Past Medical History  Diagnosis Date  . Diabetes mellitus   . Hypertension   . Arthritis   . Cataract   . CAD (coronary artery disease)   . Secondary hyperthyroidism   . Dementia   . History of thrombocytopenia   . GERD (gastroesophageal reflux disease)   . Anxiety     paranoia  . Stroke   . Chronic kidney disease     Tue, Thurs, Sat dialysis, goes to [facility] street   Past Surgical History  Procedure Laterality Date  . Arteriovenous graft placement  05/01/10    Left upper arm brachial artery to axillary vein AVG  . Thrombectomy / arteriovenous graft revision  11/07/10    Left upper arm AVG  by Dr. Imogene Burn  . Diatek catheter insertion  12/05/10    Left side  . Bone spur      foot surgery  . Av fistula placement  04/14/2011    Procedure: ARTERIOVENOUS (AV) FISTULA CREATION;  Surgeon: Nilda Simmer, MD;  Location: [facility] OR;  Service: Vascular;  Laterality: Right;   Second  stage basilic vein transposition   No family history on file. History  Substance Use Topics  . Smoking status: Never Smoker   . Smokeless tobacco: Never Used  . Alcohol Use: Yes     Comment: approx 30 years ago    Review of Systems  Constitutional: Negative for fever.  Respiratory: Negative for shortness of breath.   Cardiovascular: Negative for chest pain.  Gastrointestinal: Negative for vomiting.  All other systems reviewed and are negative.    Allergies   Review of patient's allergies indicates no known allergies.  Home Medications   Current Outpatient Rx  Name  Route  Sig  Dispense  Refill  . B Complex-C-Biotin-E-FA (RENATABS) 1 MG TABS   Oral   Take 1 tablet by mouth daily.         . bisacodyl (DULCOLAX) 10 MG suppository   Rectal   Place 10 mg rectally every 3 (three) days.          . calcium acetate (PHOSLO) 667 MG capsule   Oral   Take 667 mg by mouth 3 (three) times daily with meals.          . cinacalcet (SENSIPAR) 90 MG tablet   Oral   Take 180 mg by mouth daily at 12 noon.          . docusate sodium (COLACE) 100 MG capsule   Oral   Take 200 mg by mouth at bedtime.          Marland Kitchen escitalopram (LEXAPRO) 20 MG tablet   Oral   Take 20 mg by mouth daily at 12 noon.         . midodrine (PROAMATINE) 10 MG tablet   Oral   Take 10 mg by mouth 3 (three) times daily.         Marland Kitchen  omeprazole (PRILOSEC) 20 MG capsule   Oral   Take 20 mg by mouth daily.         . risperiDONE (RISPERDAL) 0.25 MG tablet   Oral   Take 0.25 mg by mouth at bedtime.         . simvastatin (ZOCOR) 20 MG tablet   Oral   Take 20 mg by mouth at bedtime.          . Sucroferric Oxyhydroxide 500 MG CHEW   Oral   Chew 1,000 mg by mouth 3 (three) times daily with meals.          BP 107/58  Pulse 81  Temp(Src) 99.3 F (37.4 C) (Oral)  Resp 20  SpO2 96% Physical Exam CONSTITUTIONAL: Well developed/well nourished HEAD: Normocephalic/atraumatic EYES: EOMI ENMT: Mucous membranes moist NECK: supple no meningeal signs CV: S1/S2 noted LUNGS: Lungs are clear to auscultation bilaterally, no apparent distress ABDOMEN: soft, nontender, no rebound or guarding NEURO: Pt is awake/alert, moves all extremitiesx4, he is pleasantly demented  EXTREMITIES: pulses normal, full ROM, AV fistula to right UE that is actively bleeding SKIN: warm, color normal PSYCH: no abnormalities of mood noted  ED Course  Procedures  LACERATION  REPAIR Performed by: Joya Gaskins Consent: Verbal consent obtained. Risks and benefits: risks, benefits and alternatives were discussed Patient identity confirmed: provided demographic data Time out performed prior to procedure Wound was cleansed with betadine Wound explored Laceration Location: right AV fistula Laceration Length: 0.5cm No Foreign Bodies seen or palpated Local anesthetic: none Skin closure: simple interrupted Number of sutures or staples: 3 prolene Patient tolerance: Patient tolerated the procedure well with no immediate complications.   Pt seen on arrival.  I had to place pressure dressing to bleeding site and he tolerated this well After monitoring for one hour, this was removed and it began to actively bleed I placed 3 sutures and then bleeding resolved He has good thrill to fistula He can move his right UE without difficulty and his forearm is soft to palpation  He was seen by dr fields.  He feels he is safe for d/c home and can f/u in two weeks Pt has had no further active bleeding after sutures were placed  Labs Review Labs Reviewed  POCT I-STAT, CHEM 8 - Abnormal; Notable for the following:    BUN 63 (*)    Creatinine, Ser 7.40 (*)    Glucose, Bld 100 (*)    All other components within normal limits  CG4 I-STAT (LACTIC ACID)   Imaging Review No results found.  EKG Interpretation   None       MDM   1. Complication of vascular access for dialysis, initial encounter    Nursing notes including past medical history and social history reviewed and considered in documentation Labs/vital reviewed and considered Previous records reviewed and considered     Joya Gaskins, MD 04/18/13 1622

## 2013-04-18 NOTE — ED Notes (Signed)
Pt is eating meal, no bleeding at this time. Is up for discharge called [facility] for transport.

## 2013-04-18 NOTE — Consult Note (Signed)
Pt with bleeding fistula left arm.  Suture repair by ER.  PE:  Filed Vitals:   04/18/13 1515 04/18/13 1545 04/18/13 1600 04/18/13 1607  BP: 134/65 109/54 107/58 107/58  Pulse: 84 81 81 81  Temp:      TempSrc:      Resp: 24 22 16 20   SpO2: 96% 95% 96% 96%   Right arm AVF + thrill 3 prolene sutures proximal third of fistula, no active bleeding no ulceration or eschar  A: Bleeding AVF now controlled P: Follow up with me in 2 weeks for suture removal and to check fistula  Fabienne Bruns, MD [facility] Office: [phone] Pager: [phone]

## 2013-04-18 NOTE — ED Notes (Signed)
Patient given a prepackaged [place] sandwich and applesauce with ice water.

## 2013-04-18 NOTE — ED Notes (Signed)
Report called to [facility]. Pt is stable for transport with [facility].

## 2013-04-18 NOTE — ED Notes (Signed)
Placed call to [facility] for transport back to [facility]

## 2013-04-18 NOTE — ED Notes (Addendum)
Per EMS- Pt comes from [facility] where right fistula started bleeding spontaneously. Was sitting in wheelchair when it happened. Has been bleeding for about 20 mins. EMS has been applying pressure. Pt is alert and oriented. BP 120/79. HR 100.

## 2013-04-18 NOTE — ED Notes (Signed)
Attempt IV start unsuccessful. Paged IV team. Bleeding controlled at this time with pressure dressing.

## 2013-04-18 NOTE — ED Notes (Signed)
IV team at bedside 

## 2013-04-18 NOTE — ED Notes (Signed)
MD at bedside to assess pt's fistula site because pt's hand feeling cool and pt complaining of pain from compression wrap. MD unwrapped compression wrap and site was still bleeding. MD placed sutures to help stop bleeding, pressure is still being held for precaution. NT at bedside to monitor for continued bleeding.

## 2013-04-19 ENCOUNTER — Other Ambulatory Visit: Payer: Self-pay | Admitting: Vascular Surgery

## 2013-04-19 DIAGNOSIS — E1159 Type 2 diabetes mellitus with other circulatory complications: Secondary | ICD-10-CM

## 2013-04-25 ENCOUNTER — Encounter (HOSPITAL_COMMUNITY): Payer: Self-pay | Admitting: Emergency Medicine

## 2013-04-25 ENCOUNTER — Emergency Department (HOSPITAL_COMMUNITY)
Admission: EM | Admit: 2013-04-25 | Discharge: 2013-04-25 | Disposition: A | Discharge status: Home / Self Care | Payer: Medicare Other | Attending: Emergency Medicine | Admitting: Emergency Medicine

## 2013-04-25 ENCOUNTER — Encounter: Payer: Self-pay | Admitting: Vascular Surgery

## 2013-04-25 DIAGNOSIS — Z862 Personal history of diseases of the blood and blood-forming organs and certain disorders involving the immune mechanism: Secondary | ICD-10-CM | POA: Insufficient documentation

## 2013-04-25 DIAGNOSIS — M129 Arthropathy, unspecified: Secondary | ICD-10-CM | POA: Insufficient documentation

## 2013-04-25 DIAGNOSIS — I2510 Atherosclerotic heart disease of native coronary artery without angina pectoris: Secondary | ICD-10-CM | POA: Insufficient documentation

## 2013-04-25 DIAGNOSIS — N186 End stage renal disease: Secondary | ICD-10-CM | POA: Insufficient documentation

## 2013-04-25 DIAGNOSIS — IMO0002 Reserved for concepts with insufficient information to code with codable children: Secondary | ICD-10-CM | POA: Insufficient documentation

## 2013-04-25 DIAGNOSIS — E119 Type 2 diabetes mellitus without complications: Secondary | ICD-10-CM | POA: Insufficient documentation

## 2013-04-25 DIAGNOSIS — Z79899 Other long term (current) drug therapy: Secondary | ICD-10-CM | POA: Insufficient documentation

## 2013-04-25 DIAGNOSIS — F411 Generalized anxiety disorder: Secondary | ICD-10-CM | POA: Insufficient documentation

## 2013-04-25 DIAGNOSIS — I120 Hypertensive chronic kidney disease with stage 5 chronic kidney disease or end stage renal disease: Secondary | ICD-10-CM | POA: Insufficient documentation

## 2013-04-25 DIAGNOSIS — T829XXA Unspecified complication of cardiac and vascular prosthetic device, implant and graft, initial encounter: Secondary | ICD-10-CM

## 2013-04-25 DIAGNOSIS — Z8673 Personal history of transient ischemic attack (TIA), and cerebral infarction without residual deficits: Secondary | ICD-10-CM | POA: Insufficient documentation

## 2013-04-25 DIAGNOSIS — Y841 Kidney dialysis as the cause of abnormal reaction of the patient, or of later complication, without mention of misadventure at the time of the procedure: Secondary | ICD-10-CM | POA: Insufficient documentation

## 2013-04-25 DIAGNOSIS — F0390 Unspecified dementia without behavioral disturbance: Secondary | ICD-10-CM | POA: Insufficient documentation

## 2013-04-25 DIAGNOSIS — K219 Gastro-esophageal reflux disease without esophagitis: Secondary | ICD-10-CM | POA: Insufficient documentation

## 2013-04-25 DIAGNOSIS — Z8669 Personal history of other diseases of the nervous system and sense organs: Secondary | ICD-10-CM | POA: Insufficient documentation

## 2013-04-25 DIAGNOSIS — T82898A Other specified complication of vascular prosthetic devices, implants and grafts, initial encounter: Secondary | ICD-10-CM

## 2013-04-25 LAB — CBC
HCT: 36.4 % — ABNORMAL LOW (ref 39.0–52.0)
MCHC: 32.1 g/dL (ref 30.0–36.0)
MCV: 93.6 fL (ref 78.0–100.0)
RBC: 3.89 MIL/uL — ABNORMAL LOW (ref 4.22–5.81)
RDW: 16.6 % — ABNORMAL HIGH (ref 11.5–15.5)
WBC: 8.5 10*3/uL (ref 4.0–10.5)

## 2013-04-25 LAB — BASIC METABOLIC PANEL
Chloride: 101 mEq/L (ref 96–112)
Creatinine, Ser: 5.96 mg/dL — ABNORMAL HIGH (ref 0.50–1.35)
GFR calc Af Amer: 9 mL/min — ABNORMAL LOW (ref >90)
GFR calc non Af Amer: 8 mL/min — ABNORMAL LOW (ref >90)
Potassium: 3.6 mEq/L (ref 3.5–5.1)
Sodium: 141 mEq/L (ref 135–145)

## 2013-04-25 LAB — APTT: aPTT: 32 seconds (ref 24–37)

## 2013-04-25 LAB — PROTIME-INR
INR: 1.1 (ref 0.00–1.49)
Prothrombin Time: 14 seconds (ref 11.6–15.2)

## 2013-04-25 NOTE — ED Notes (Signed)
Pt eating renal diet breakfast tray.

## 2013-04-25 NOTE — ED Provider Notes (Signed)
CSN: [id number]     Arrival date & time 04/25/13  0759 History   First MD Initiated Contact with Patient 04/25/13 [phone]     Chief Complaint  Patient presents with  . Vascular Access Problem   HPI [name] is a 79 y.o. male who presents to the emergency department for concern of bleeding from his fistula.  Patient has history of ESRD on T/Th/Sa dialysis.  History provided from EMS and RN at SNF as patient is nonverbal with dementia.  Per them, patient was lying in bed and had an episode of bleeding from the dialysis site "that covered about a watermelon sized area of the sheet in blood.  EMS called and applied pressure dressing.  No instability prior to arrival.  8 days ago, patient had small laceration at site that was repaired here with three stitches and he was sent home.  Doing well since then.  Past Medical History  Diagnosis Date  . Diabetes mellitus   . Hypertension   . Arthritis   . Cataract   . CAD (coronary artery disease)   . Secondary hyperthyroidism   . Dementia   . History of thrombocytopenia   . GERD (gastroesophageal reflux disease)   . Anxiety     paranoia  . Stroke   . Chronic kidney disease     Tue, Thurs, Sat dialysis, goes to [facility] street   Past Surgical History  Procedure Laterality Date  . Arteriovenous graft placement  05/01/10    Left upper arm brachial artery to axillary vein AVG  . Thrombectomy / arteriovenous graft revision  11/07/10    Left upper arm AVG  by Dr. Imogene Burn  . Diatek catheter insertion  12/05/10    Left side  . Bone spur      foot surgery  . Av fistula placement  04/14/2011    Procedure: ARTERIOVENOUS (AV) FISTULA CREATION;  Surgeon: Nilda Simmer, MD;  Location: [facility] OR;  Service: Vascular;  Laterality: Right;   Second  stage basilic vein transposition   History reviewed. No pertinent family history. History  Substance Use Topics  . Smoking status: Never Smoker   . Smokeless tobacco: Never Used  . Alcohol Use: Yes   Comment: approx 30 years ago    Review of Systems  Unable to perform ROS: Dementia    Allergies  Review of patient's allergies indicates no known allergies.  Home Medications   Current Outpatient Rx  Name  Route  Sig  Dispense  Refill  . B Complex-C-Biotin-E-FA (RENATABS) 1 MG TABS   Oral   Take 1 tablet by mouth daily.         . bisacodyl (DULCOLAX) 10 MG suppository   Rectal   Place 10 mg rectally every 3 (three) days.          . calcium acetate (PHOSLO) 667 MG capsule   Oral   Take 667 mg by mouth 3 (three) times daily with meals.          . cinacalcet (SENSIPAR) 90 MG tablet   Oral   Take 180 mg by mouth daily at 12 noon.          . docusate sodium (COLACE) 100 MG capsule   Oral   Take 200 mg by mouth at bedtime.          Marland Kitchen escitalopram (LEXAPRO) 20 MG tablet   Oral   Take 20 mg by mouth daily at 12 noon.         Marland Kitchen  midodrine (PROAMATINE) 10 MG tablet   Oral   Take 10 mg by mouth 3 (three) times daily.         Marland Kitchen omeprazole (PRILOSEC) 20 MG capsule   Oral   Take 20 mg by mouth daily.         . risperiDONE (RISPERDAL) 0.25 MG tablet   Oral   Take 0.25 mg by mouth at bedtime.         . simvastatin (ZOCOR) 20 MG tablet   Oral   Take 20 mg by mouth at bedtime.          . Sucroferric Oxyhydroxide 500 MG CHEW   Oral   Chew 1,000 mg by mouth 3 (three) times daily with meals.          BP 96/46  Temp(Src) 97.6 F (36.4 C) (Oral)  Resp 25  SpO2 95% Physical Exam  Nursing note and vitals reviewed. Constitutional: He appears well-developed and well-nourished. No distress.  HENT:  Head: Normocephalic and atraumatic.  Right Ear: External ear normal.  Left Ear: External ear normal.  Mouth/Throat: Oropharynx is clear and moist. No oropharyngeal exudate.  Eyes: Conjunctivae are normal. Pupils are equal, round, and reactive to light. Right eye exhibits no discharge.  Neck: Normal range of motion. Neck supple. No tracheal deviation  present.  Cardiovascular: Normal rate, regular rhythm and intact distal pulses.   R fistula c/d/i.  Good thrill.  Hemostatic.  Small area of eschar.  No lacerations.  Three sutures in place with appropriately healing lac.  Pulmonary/Chest: Effort normal. No respiratory distress. He has no wheezes. He has no rales.  Abdominal: Soft. He exhibits no distension. There is no tenderness. There is no rebound and no guarding.  Musculoskeletal: Normal range of motion.  Neurological: He is alert.  Nonverbal.  Cooperative.  Skin: Skin is warm and dry. No rash noted. He is not diaphoretic.    ED Course  Procedures (including critical care time) Labs Review Labs Reviewed  CBC - Abnormal; Notable for the following:    RBC 3.89 (*)    Hemoglobin 11.7 (*)    HCT 36.4 (*)    RDW 16.6 (*)    Platelets 120 (*)    All other components within normal limits  BASIC METABOLIC PANEL - Abnormal; Notable for the following:    BUN 34 (*)    Creatinine, Ser 5.96 (*)    GFR calc non Af Amer 8 (*)    GFR calc Af Amer 9 (*)    All other components within normal limits  PROTIME-INR  APTT   Imaging Review No results found.  EKG Interpretation   None       MDM   1. Complication of arteriovenous dialysis fistula, initial encounter    [name] is a 79 y.o. male who presents to the ED with bleeding from dialysis fistula.  On arrival, sutures in place from similar situation last week and small area of eschar noted.  Wound hemostatic.  Vascular surgery consulted.  Fistula remained hemostatic.  Vascular surgery recommending discharge and f/u in one week.  No other acute events or concerns.  Patient discharged to SNF.    Arloa Koh, MD 04/25/13 1040

## 2013-04-25 NOTE — Progress Notes (Signed)
Patient ID: [name], male   DOB: 1931/11/06, 79 y.o.   MRN: [id number] Called to see the patient regarding bleeding from a left upper arm AV fistula. The patient had a basilic vein transposition fistula placed several years ago. He was in the emergency room 1 week ago today was some bleeding from this with some sutures placed for control. He lives in a nursing facility and is a phasic and demented. Apparently was found with recurrent bleeding from this area this morning.  Physical exam he has excellent thrill. There does not appear to be any evidence of disruption of the fistula. The sutures are intact. There is a proximally 3-4 mm eschar this appears to be partial thickness of the skin and does not have any evidence of separation or extension down towards the venous fistula. Included the outflow and manipulated this and in am unable to see any source for bleeding from this.  Impression and plan history of recurrent bleeding from this. The patient has an appointment to see [place] in our office as ordered he scheduled in one week for followup. Patient will keep his appointment. This will be for suture removal. I do not see any evidence of disruption that would require surgical treatment. Certainly is concerning with history of some ongoing bleeding. We'll continue to watch.

## 2013-04-25 NOTE — ED Notes (Signed)
Vascular surgery team at bedside with patient.

## 2013-04-25 NOTE — ED Notes (Signed)
Pt arrives via EMS from nursing home with bleeding right arm fistula. They noted bleeding to begin two hours ago when helping patient get dressed for the morning. Pt denies any injury. Denies pain. Bleeding controlled at present. VSS.

## 2013-04-26 ENCOUNTER — Encounter (HOSPITAL_COMMUNITY): Payer: Medicare Other | Admitting: Anesthesiology

## 2013-04-26 ENCOUNTER — Emergency Department (HOSPITAL_COMMUNITY): Payer: Medicare Other

## 2013-04-26 ENCOUNTER — Other Ambulatory Visit: Payer: Self-pay

## 2013-04-26 ENCOUNTER — Observation Stay (HOSPITAL_COMMUNITY): Payer: Medicare Other

## 2013-04-26 ENCOUNTER — Encounter (HOSPITAL_COMMUNITY)
Admission: EM | Disposition: A | Discharge status: Skilled Nursing Facility | Payer: Self-pay | Source: Home / Self Care | Attending: Emergency Medicine

## 2013-04-26 ENCOUNTER — Emergency Department (HOSPITAL_COMMUNITY): Payer: Medicare Other | Admitting: Anesthesiology

## 2013-04-26 ENCOUNTER — Encounter (HOSPITAL_COMMUNITY): Payer: Self-pay | Admitting: Emergency Medicine

## 2013-04-26 ENCOUNTER — Observation Stay (HOSPITAL_COMMUNITY)
Admission: EM | Admit: 2013-04-26 | Discharge: 2013-04-27 | Disposition: A | Discharge status: Skilled Nursing Facility | Payer: Medicare Other | Attending: Vascular Surgery | Admitting: Vascular Surgery

## 2013-04-26 DIAGNOSIS — Z992 Dependence on renal dialysis: Secondary | ICD-10-CM | POA: Insufficient documentation

## 2013-04-26 DIAGNOSIS — E119 Type 2 diabetes mellitus without complications: Secondary | ICD-10-CM | POA: Insufficient documentation

## 2013-04-26 DIAGNOSIS — I120 Hypertensive chronic kidney disease with stage 5 chronic kidney disease or end stage renal disease: Secondary | ICD-10-CM | POA: Insufficient documentation

## 2013-04-26 DIAGNOSIS — F0390 Unspecified dementia without behavioral disturbance: Secondary | ICD-10-CM | POA: Insufficient documentation

## 2013-04-26 DIAGNOSIS — N186 End stage renal disease: Secondary | ICD-10-CM

## 2013-04-26 DIAGNOSIS — T82898A Other specified complication of vascular prosthetic devices, implants and grafts, initial encounter: Principal | ICD-10-CM | POA: Insufficient documentation

## 2013-04-26 DIAGNOSIS — N2581 Secondary hyperparathyroidism of renal origin: Secondary | ICD-10-CM | POA: Insufficient documentation

## 2013-04-26 DIAGNOSIS — T82590A Other mechanical complication of surgically created arteriovenous fistula, initial encounter: Secondary | ICD-10-CM

## 2013-04-26 DIAGNOSIS — Y832 Surgical operation with anastomosis, bypass or graft as the cause of abnormal reaction of the patient, or of later complication, without mention of misadventure at the time of the procedure: Secondary | ICD-10-CM | POA: Insufficient documentation

## 2013-04-26 DIAGNOSIS — T829XXD Unspecified complication of cardiac and vascular prosthetic device, implant and graft, subsequent encounter: Secondary | ICD-10-CM

## 2013-04-26 DIAGNOSIS — Z8673 Personal history of transient ischemic attack (TIA), and cerebral infarction without residual deficits: Secondary | ICD-10-CM | POA: Insufficient documentation

## 2013-04-26 HISTORY — PX: INSERTION OF DIALYSIS CATHETER: SHX1324

## 2013-04-26 HISTORY — PX: LIGATION OF ARTERIOVENOUS  FISTULA: SHX5948

## 2013-04-26 LAB — POCT I-STAT, CHEM 8
Calcium, Ion: 1.09 mmol/L — ABNORMAL LOW (ref 1.13–1.30)
Creatinine, Ser: 4.1 mg/dL — ABNORMAL HIGH (ref 0.50–1.35)
Glucose, Bld: 83 mg/dL (ref 70–99)
Hemoglobin: 12.2 g/dL — ABNORMAL LOW (ref 13.0–17.0)
Potassium: 3.4 mEq/L — ABNORMAL LOW (ref 3.5–5.1)
TCO2: 33 mmol/L (ref 0–100)

## 2013-04-26 LAB — CBC WITH DIFFERENTIAL/PLATELET
Basophils Absolute: 0 10*3/uL (ref 0.0–0.1)
Basophils Relative: 0 % (ref 0–1)
Eosinophils Absolute: 0.2 10*3/uL (ref 0.0–0.7)
MCH: 29.7 pg (ref 26.0–34.0)
MCHC: 32.2 g/dL (ref 30.0–36.0)
Monocytes Absolute: 1 10*3/uL (ref 0.1–1.0)
Neutro Abs: 5.1 10*3/uL (ref 1.7–7.7)
Neutrophils Relative %: 61 % (ref 43–77)
Platelets: 136 10*3/uL — ABNORMAL LOW (ref 150–400)
RDW: 16.4 % — ABNORMAL HIGH (ref 11.5–15.5)
WBC: 8.3 10*3/uL (ref 4.0–10.5)

## 2013-04-26 LAB — PROTIME-INR
INR: 1.09 (ref 0.00–1.49)
Prothrombin Time: 13.9 seconds (ref 11.6–15.2)

## 2013-04-26 LAB — POCT I-STAT 4, (NA,K, GLUC, HGB,HCT)
HCT: 29 % — ABNORMAL LOW (ref 39.0–52.0)
Hemoglobin: 9.9 g/dL — ABNORMAL LOW (ref 13.0–17.0)
Potassium: 2.6 mEq/L — CL (ref 3.5–5.1)

## 2013-04-26 LAB — APTT: aPTT: 30 seconds (ref 24–37)

## 2013-04-26 SURGERY — LIGATION OF ARTERIOVENOUS  FISTULA
Anesthesia: General | Site: Neck | Laterality: Right

## 2013-04-26 MED ORDER — ALBUMIN HUMAN 5 % IV SOLN
INTRAVENOUS | Status: AC
  Filled 2013-04-26: qty 250

## 2013-04-26 MED ORDER — ALBUMIN HUMAN 5 % IV SOLN
12.5000 g | Freq: Once | INTRAVENOUS | Status: AC
  Administered 2013-04-26: 12.5 g via INTRAVENOUS

## 2013-04-26 MED ORDER — SODIUM CHLORIDE 0.9 % IR SOLN
Status: DC | PRN
  Administered 2013-04-26: 21:00:00

## 2013-04-26 MED ORDER — SODIUM CHLORIDE 0.9 % IJ SOLN
OROMUCOSAL | Status: DC | PRN
  Administered 2013-04-26: 22:00:00 via TOPICAL

## 2013-04-26 MED ORDER — SODIUM CHLORIDE 0.9 % IV SOLN
INTRAVENOUS | Status: DC | PRN
  Administered 2013-04-26 (×2): via INTRAVENOUS

## 2013-04-26 MED ORDER — SUCCINYLCHOLINE CHLORIDE 20 MG/ML IJ SOLN
INTRAMUSCULAR | Status: DC | PRN
  Administered 2013-04-26: 80 mg via INTRAVENOUS

## 2013-04-26 MED ORDER — PROPOFOL 10 MG/ML IV BOLUS
INTRAVENOUS | Status: DC | PRN
  Administered 2013-04-26: 50 mg via INTRAVENOUS

## 2013-04-26 MED ORDER — CEFAZOLIN SODIUM-DEXTROSE 2-3 GM-% IV SOLR
2.0000 g | Freq: Once | INTRAVENOUS | Status: AC
  Administered 2013-04-26: 2 g via INTRAVENOUS
  Filled 2013-04-26: qty 50

## 2013-04-26 MED ORDER — POTASSIUM CHLORIDE 20 MEQ/15ML (10%) PO LIQD
40.0000 meq | Freq: Once | ORAL | Status: AC
  Administered 2013-04-27: 40 meq via ORAL
  Filled 2013-04-26: qty 30

## 2013-04-26 MED ORDER — CEFAZOLIN SODIUM-DEXTROSE 2-3 GM-% IV SOLR
INTRAVENOUS | Status: AC
  Filled 2013-04-26: qty 50

## 2013-04-26 MED ORDER — PHENYLEPHRINE HCL 10 MG/ML IJ SOLN
10.0000 mg | INTRAVENOUS | Status: DC | PRN
  Administered 2013-04-26: 40 ug/min via INTRAVENOUS

## 2013-04-26 MED ORDER — FENTANYL CITRATE 0.05 MG/ML IJ SOLN: 25.0000 ug | INTRAMUSCULAR | Status: DC | PRN

## 2013-04-26 MED ORDER — LIDOCAINE HCL (CARDIAC) 20 MG/ML IV SOLN
INTRAVENOUS | Status: DC | PRN
  Administered 2013-04-26: 30 mg via INTRAVENOUS

## 2013-04-26 MED ORDER — HEPARIN SODIUM (PORCINE) 1000 UNIT/ML IJ SOLN
INTRAMUSCULAR | Status: AC
  Filled 2013-04-26: qty 1

## 2013-04-26 MED ORDER — ONDANSETRON HCL 4 MG/2ML IJ SOLN
INTRAMUSCULAR | Status: DC | PRN
  Administered 2013-04-26: 4 mg via INTRAVENOUS

## 2013-04-26 MED ORDER — FENTANYL CITRATE 0.05 MG/ML IJ SOLN
INTRAMUSCULAR | Status: DC | PRN
  Administered 2013-04-26 (×2): 50 ug via INTRAVENOUS

## 2013-04-26 MED ORDER — 0.9 % SODIUM CHLORIDE (POUR BTL) OPTIME
TOPICAL | Status: DC | PRN
  Administered 2013-04-26: 1000 mL

## 2013-04-26 MED ORDER — EPHEDRINE SULFATE 50 MG/ML IJ SOLN
INTRAMUSCULAR | Status: DC | PRN
  Administered 2013-04-26: 20 mg via INTRAVENOUS
  Administered 2013-04-26: 10 mg via INTRAVENOUS
  Administered 2013-04-26: 20 mg via INTRAVENOUS

## 2013-04-26 MED ORDER — LIDOCAINE HCL (PF) 1 % IJ SOLN
INTRAMUSCULAR | Status: AC
  Filled 2013-04-26: qty 30

## 2013-04-26 MED ORDER — HEPARIN SODIUM (PORCINE) 1000 UNIT/ML IJ SOLN
INTRAMUSCULAR | Status: DC | PRN
  Administered 2013-04-26: 4.6 mL

## 2013-04-26 SURGICAL SUPPLY — 72 items
ADH SKN CLS APL DERMABOND .7 (GAUZE/BANDAGES/DRESSINGS) ×2
BAG DECANTER FOR FLEXI CONT (MISCELLANEOUS) ×3 IMPLANT
BANDAGE ELASTIC 4 VELCRO ST LF (GAUZE/BANDAGES/DRESSINGS) ×1 IMPLANT
BANDAGE GAUZE ELAST BULKY 4 IN (GAUZE/BANDAGES/DRESSINGS) ×1 IMPLANT
CANISTER SUCTION 2500CC (MISCELLANEOUS) ×3 IMPLANT
CATH CANNON HEMO 15F 50CM (CATHETERS) IMPLANT
CATH CANNON HEMO 15FR 19 (HEMODIALYSIS SUPPLIES) IMPLANT
CATH CANNON HEMO 15FR 23CM (HEMODIALYSIS SUPPLIES) ×3 IMPLANT
CATH CANNON HEMO 15FR 31CM (HEMODIALYSIS SUPPLIES) IMPLANT
CATH CANNON HEMO 15FR 32 (HEMODIALYSIS SUPPLIES) IMPLANT
CATH CANNON HEMO 15FR 32CM (HEMODIALYSIS SUPPLIES) IMPLANT
CATH STRAIGHT 5FR 65CM (CATHETERS) IMPLANT
CHLORAPREP W/TINT 26ML (MISCELLANEOUS) ×3 IMPLANT
COVER PROBE W GEL 5X96 (DRAPES) IMPLANT
COVER SURGICAL LIGHT HANDLE (MISCELLANEOUS) ×3 IMPLANT
DECANTER SPIKE VIAL GLASS SM (MISCELLANEOUS) ×3 IMPLANT
DERMABOND ADVANCED (GAUZE/BANDAGES/DRESSINGS) ×1
DERMABOND ADVANCED .7 DNX12 (GAUZE/BANDAGES/DRESSINGS) ×2 IMPLANT
DRAPE C-ARM 42X72 X-RAY (DRAPES) ×5 IMPLANT
DRAPE CHEST BREAST 15X10 FENES (DRAPES) ×3 IMPLANT
ELECT REM PT RETURN 9FT ADLT (ELECTROSURGICAL) ×3
ELECTRODE REM PT RTRN 9FT ADLT (ELECTROSURGICAL) ×2 IMPLANT
GAUZE SPONGE 2X2 8PLY STRL LF (GAUZE/BANDAGES/DRESSINGS) ×2 IMPLANT
GAUZE SPONGE 4X4 16PLY XRAY LF (GAUZE/BANDAGES/DRESSINGS) ×3 IMPLANT
GEL ULTRASOUND 20GR AQUASONIC (MISCELLANEOUS) ×3 IMPLANT
GLOVE BIO SURGEON STRL SZ 6.5 (GLOVE) ×6 IMPLANT
GLOVE BIO SURGEON STRL SZ7.5 (GLOVE) ×4 IMPLANT
GLOVE BIOGEL PI IND STRL 6.5 (GLOVE) IMPLANT
GLOVE BIOGEL PI IND STRL 7.0 (GLOVE) IMPLANT
GLOVE BIOGEL PI IND STRL 7.5 (GLOVE) IMPLANT
GLOVE BIOGEL PI INDICATOR 6.5 (GLOVE) ×4
GLOVE BIOGEL PI INDICATOR 7.0 (GLOVE) ×1
GLOVE BIOGEL PI INDICATOR 7.5 (GLOVE) ×5
GLOVE SURG SS PI 7.5 STRL IVOR (GLOVE) ×4 IMPLANT
GOWN PREVENTION PLUS XLARGE (GOWN DISPOSABLE) ×7 IMPLANT
GOWN STRL NON-REIN LRG LVL3 (GOWN DISPOSABLE) ×10 IMPLANT
GUIDEWIRE AMPLATZ STIFF 0.35 (WIRE) ×1 IMPLANT
KIT BASIN OR (CUSTOM PROCEDURE TRAY) ×3 IMPLANT
KIT ROOM TURNOVER OR (KITS) ×3 IMPLANT
LOOP VESSEL MINI RED (MISCELLANEOUS) IMPLANT
NDL HYPO 25GX1X1/2 BEV (NEEDLE) ×1 IMPLANT
NEEDLE 18GX1X1/2 (RX/OR ONLY) (NEEDLE) ×3 IMPLANT
NEEDLE 22X1 1/2 (OR ONLY) (NEEDLE) ×3 IMPLANT
NEEDLE HYPO 25GX1X1/2 BEV (NEEDLE) ×3 IMPLANT
NS IRRIG 1000ML POUR BTL (IV SOLUTION) ×3 IMPLANT
PACK CV ACCESS (CUSTOM PROCEDURE TRAY) ×3 IMPLANT
PACK SURGICAL SETUP 50X90 (CUSTOM PROCEDURE TRAY) ×3 IMPLANT
PAD ARMBOARD 7.5X6 YLW CONV (MISCELLANEOUS) ×6 IMPLANT
SET MICROPUNCTURE 5F STIFF (MISCELLANEOUS) IMPLANT
SPONGE GAUZE 2X2 STER 10/PKG (GAUZE/BANDAGES/DRESSINGS) ×2
SPONGE GAUZE 4X4 12PLY (GAUZE/BANDAGES/DRESSINGS) ×1 IMPLANT
SPONGE SURGIFOAM ABS GEL 100 (HEMOSTASIS) ×3 IMPLANT
SUT ETHILON 3 0 PS 1 (SUTURE) ×6 IMPLANT
SUT PROLENE 1 XLH 60 (SUTURE) ×1 IMPLANT
SUT PROLENE 2 0 MH 48 (SUTURE) ×1 IMPLANT
SUT PROLENE 6 0 CC (SUTURE) ×1 IMPLANT
SUT SILK 0 (SUTURE) IMPLANT
SUT SILK 0 TIES 10X30 (SUTURE) ×3 IMPLANT
SUT VIC AB 3-0 SH 27 (SUTURE) ×3
SUT VIC AB 3-0 SH 27X BRD (SUTURE) ×2 IMPLANT
SUT VICRYL 4-0 PS2 18IN ABS (SUTURE) ×3 IMPLANT
SYR 20CC LL (SYRINGE) ×6 IMPLANT
SYR 30ML LL (SYRINGE) IMPLANT
SYR 5ML LL (SYRINGE) ×6 IMPLANT
SYR CONTROL 10ML LL (SYRINGE) ×3 IMPLANT
SYRINGE 10CC LL (SYRINGE) ×3 IMPLANT
TAPE CLOTH SURG 4X10 WHT LF (GAUZE/BANDAGES/DRESSINGS) ×2 IMPLANT
TOWEL OR 17X24 6PK STRL BLUE (TOWEL DISPOSABLE) ×9 IMPLANT
TOWEL OR 17X26 10 PK STRL BLUE (TOWEL DISPOSABLE) ×3 IMPLANT
UNDERPAD 30X30 INCONTINENT (UNDERPADS AND DIAPERS) ×3 IMPLANT
WATER STERILE IRR 1000ML POUR (IV SOLUTION) ×3 IMPLANT
WIRE AMPLATZ SS-J .035X180CM (WIRE) IMPLANT

## 2013-04-26 NOTE — Preoperative (Signed)
Beta Blockers   Reason not to administer Beta Blockers:Not Applicable 

## 2013-04-26 NOTE — Transfer of Care (Signed)
Immediate Anesthesia Transfer of Care Note  Patient: [name]  Procedure(s) Performed: Procedure(s): LIGATION OF ARTERIOVENOUS  FISTULA (Right) INSERTION OF DIALYSIS CATHETER (Left)  Patient Location: PACU  Anesthesia Type:General  Level of Consciousness: awake and patient cooperative  Airway & Oxygen Therapy: Patient Spontanous Breathing and Patient connected to nasal cannula oxygen  Post-op Assessment: Report given to PACU RN, Post -op Vital signs reviewed and stable and Patient moving all extremities X 4  Post vital signs: Reviewed and stable  Complications: No apparent anesthesia complications

## 2013-04-26 NOTE — Anesthesia Procedure Notes (Signed)
Procedure Name: Intubation Date/Time: 04/26/2013 8:14 PM Performed by: Rogelia Boga Pre-anesthesia Checklist: Patient identified, Emergency Drugs available, Suction available, Patient being monitored and Timeout performed Patient Re-evaluated:Patient Re-evaluated prior to inductionOxygen Delivery Method: Circle system utilized Preoxygenation: Pre-oxygenation with 100% oxygen Intubation Type: IV induction, Rapid sequence and Cricoid Pressure applied Laryngoscope Size: Mac and 4 Grade View: Grade I Tube type: Oral Tube size: 7.5 mm Number of attempts: 1 Airway Equipment and Method: Stylet Placement Confirmation: ETT inserted through vocal cords under direct vision,  positive ETCO2 and breath sounds checked- equal and bilateral Secured at: 22 cm Tube secured with: Tape Dental Injury: Teeth and Oropharynx as per pre-operative assessment

## 2013-04-26 NOTE — Anesthesia Preprocedure Evaluation (Addendum)
Anesthesia Evaluation  Patient identified by MRN, date of birth, ID band Patient confused  General Assessment Comment:Oriented to person  Reviewed: Allergy & Precautions, H&P , NPO status , Patient's Chart, lab work & pertinent test results  Airway Mallampati: II TM Distance: >3 FB Neck ROM: full    Dental no notable dental hx. (+) Edentulous Upper and Edentulous Lower   Pulmonary neg pulmonary ROS,  breath sounds clear to auscultation  Pulmonary exam normal       Cardiovascular hypertension, Pt. on medications + CAD + dysrhythmias + Valvular Problems/Murmurs Rhythm:regular Rate:Normal + Systolic murmurs and + Diastolic murmurs    Neuro/Psych Anxiety CVA, No Residual Symptoms negative neurological ROS     GI/Hepatic Neg liver ROS, GERD-  Medicated and Controlled,  Endo/Other  diabetes, Well ControlledDiet controlled diabetes  Renal/GU CRF and DialysisRenal disease  negative genitourinary   Musculoskeletal   Abdominal   Peds  Hematology negative hematology ROS (+) anemia ,   Anesthesia Other Findings   Reproductive/Obstetrics negative OB ROS                       Anesthesia Physical Anesthesia Plan  ASA: III and emergent  Anesthesia Plan: General   Post-op Pain Management:    Induction: Intravenous  Airway Management Planned: Oral ETT  Additional Equipment:   Intra-op Plan:   Post-operative Plan: Extubation in OR  Informed Consent: I have reviewed the patients History and Physical, chart, labs and discussed the procedure including the risks, benefits and alternatives for the proposed anesthesia with the patient or authorized representative who has indicated his/her understanding and acceptance.   Dental advisory given  Plan Discussed with: CRNA, Anesthesiologist and Surgeon  Anesthesia Plan Comments:      Anesthesia Quick Evaluation

## 2013-04-26 NOTE — ED Notes (Signed)
Per EMS pt came from [facility] for bleeding fistula site. This has happened two other times this week and was sutured 3 days ago. Pt did complete dialysis today and bleeding started during dinner. EMS reports pt had two towels under him that were saturated in blood and site was bleeding very heavily. Bleeding controlled by EMS. Distal pulse heard with doppler during triage.

## 2013-04-26 NOTE — ED Provider Notes (Signed)
CSN: [id number]     Arrival date & time 04/26/13  1750 History   First MD Initiated Contact with Patient 04/26/13 1755     Chief Complaint  Patient presents with  . Vascular Access Problem   Level V caveat for dementia  (Consider location/radiation/quality/duration/timing/severity/associated sxs/prior Treatment) HPI Patient presents from his nursing home by EMS for bleeding from his dialysis graft. Patient had dialysis today with no problems. They report this is his third episode for bleeding from his graft in the last week. EMS reports the dressing applied by the nursing home was bloody so they placed a additional pressure wrap on top of that. Patient states he feels fine. He denies any pain, nausea, shortness of breath or chest pain.  PCP Dr. Renato Gails with [facility]  Past Medical History  Diagnosis Date  . Diabetes mellitus   . Hypertension   . Arthritis   . Cataract   . CAD (coronary artery disease)   . Secondary hyperthyroidism   . Dementia   . History of thrombocytopenia   . GERD (gastroesophageal reflux disease)   . Anxiety     paranoia  . Stroke   . Chronic kidney disease     Tue, Thurs, Sat dialysis, goes to [address]  . Anemia    Past Surgical History  Procedure Laterality Date  . Arteriovenous graft placement  05/01/10    Left upper arm brachial artery to axillary vein AVG  . Thrombectomy / arteriovenous graft revision  11/07/10    Left upper arm AVG  by Dr. Imogene Burn  . Diatek catheter insertion  12/05/10    Left side  . Bone spur      foot surgery  . Av fistula placement  04/14/2011    Procedure: ARTERIOVENOUS (AV) FISTULA CREATION;  Surgeon: Nilda Simmer, MD;  Location: [facility] OR;  Service: Vascular;  Laterality: Right;   Second  stage basilic vein transposition  . Knee surgery Left   . Foot surgery Left   . Bunionectomy     No family history on file. History  Substance Use Topics  . Smoking status: Never Smoker   . Smokeless tobacco: Never  Used  . Alcohol Use: Yes     Comment: approx 30 years ago  lives in a NH  Review of Systems  All other systems reviewed and are negative.    Allergies  Review of patient's allergies indicates no known allergies.  Home Medications   Current Outpatient Rx  Name  Route  Sig  Dispense  Refill  . B Complex-C-Biotin-E-FA (RENATABS) 1 MG TABS   Oral   Take 1 tablet by mouth daily.         . bisacodyl (DULCOLAX) 10 MG suppository   Rectal   Place 10 mg rectally every three (3) days as needed (for no bowel movement).          . calcium acetate (PHOSLO) 667 MG capsule   Oral   Take 667 mg by mouth 3 (three) times daily with meals.          . cinacalcet (SENSIPAR) 90 MG tablet   Oral   Take 180 mg by mouth daily at 12 noon.          . docusate sodium (COLACE) 100 MG capsule   Oral   Take 200 mg by mouth at bedtime.          Marland Kitchen escitalopram (LEXAPRO) 20 MG tablet   Oral   Take 20 mg  by mouth daily at 12 noon.         . ethyl chloride spray   Topical   Apply 1 application topically See admin instructions. Apply to dialysis site prior to dialysis         . midodrine (PROAMATINE) 10 MG tablet   Oral   Take 10 mg by mouth 3 (three) times daily.         . multivitamin (RENA-VIT) TABS tablet   Oral   Take 1 tablet by mouth daily.         Marland Kitchen omeprazole (PRILOSEC) 20 MG capsule   Oral   Take 20 mg by mouth daily.         Marland Kitchen oxycodone (OXY-IR) 5 MG capsule   Oral   Take 5 mg by mouth every 12 (twelve) hours as needed for pain.          Marland Kitchen risperiDONE (RISPERDAL) 0.25 MG tablet   Oral   Take 0.25 mg by mouth at bedtime. Do not give prior to dialysis         . simvastatin (ZOCOR) 20 MG tablet   Oral   Take 20 mg by mouth at bedtime.          . Sucroferric Oxyhydroxide (VELPHORO) 500 MG CHEW   Oral   Chew 1,000 mg by mouth 2 (two) times daily with a meal.          BP 88/48  Pulse 96  Temp(Src) 98.4 F (36.9 C) (Oral)  Resp 18  SpO2  98%  Vital signs normal except tachycardia  Physical Exam  Nursing note and vitals reviewed. Constitutional: He appears well-developed and well-nourished.  Non-toxic appearance. He does not appear ill. No distress.  pleasant  HENT:  Head: Normocephalic and atraumatic.  Right Ear: External ear normal.  Left Ear: External ear normal.  Nose: Nose normal. No mucosal edema or rhinorrhea.  Mouth/Throat: Oropharynx is clear and moist and mucous membranes are normal. No dental abscesses or uvula swelling.  Eyes: Conjunctivae and EOM are normal. Pupils are equal, round, and reactive to light.  Neck: Normal range of motion and full passive range of motion without pain. Neck supple.  Cardiovascular: Normal rate, regular rhythm and normal heart sounds.  Exam reveals no gallop and no friction rub.   No murmur heard. Pulmonary/Chest: Effort normal and breath sounds normal. No respiratory distress. He has no wheezes. He has no rhonchi. He has no rales. He exhibits no tenderness and no crepitus.  Abdominal: Soft. Normal appearance and bowel sounds are normal. He exhibits no distension. There is no tenderness. There is no rebound and no guarding.  Musculoskeletal: Normal range of motion. He exhibits no edema and no tenderness.  Moves all extremities well. Pressure dressing removed from his RUE slowly. Before the bottom dressing was removed he has a pulsating thick stream of blood. I immediately held pressure.   Neurological: He is alert. He has normal strength. No cranial nerve deficit.  Skin: Skin is warm, dry and intact. No rash noted. No erythema. No pallor.  Psychiatric: He has a normal mood and affect. His speech is normal and behavior is normal. His mood appears not anxious.    ED Course  Procedures (including critical care time)  Quick Clot was placed over the wound, the 4x4 folded once then abdominal pad. I help direct pressure over the bleeding site through the dressings for about 7 minutes  then coban was placed. I continued to hold pressure.  16:18 Dr Darrick Penna notified patient needs to be seen.   Review of charts shows he was seen on 12/1 and again 12/8 for bleeding. He had 3 sutures placed on 12/1. He was seen by Dr Darrick Penna on 12/1.    Dr Darrick Penna is going to take to the OR to tie off the graft and place new dialysis catheter.   Labs Review Results for orders placed during the hospital encounter of 04/26/13  CBC WITH DIFFERENTIAL      Result Value Range   WBC 8.3  4.0 - 10.5 K/uL   RBC 3.43 (*) 4.22 - 5.81 MIL/uL   Hemoglobin 10.2 (*) 13.0 - 17.0 g/dL   HCT 84.6 (*) 96.2 - 95.2 %   MCV 92.4  78.0 - 100.0 fL   MCH 29.7  26.0 - 34.0 pg   MCHC 32.2  30.0 - 36.0 g/dL   RDW 84.1 (*) 32.4 - 40.1 %   Platelets 136 (*) 150 - 400 K/uL   Neutrophils Relative % 61  43 - 77 %   Neutro Abs 5.1  1.7 - 7.7 K/uL   Lymphocytes Relative 26  12 - 46 %   Lymphs Abs 2.1  0.7 - 4.0 K/uL   Monocytes Relative 11  3 - 12 %   Monocytes Absolute 1.0  0.1 - 1.0 K/uL   Eosinophils Relative 2  0 - 5 %   Eosinophils Absolute 0.2  0.0 - 0.7 K/uL   Basophils Relative 0  0 - 1 %   Basophils Absolute 0.0  0.0 - 0.1 K/uL  PROTIME-INR      Result Value Range   Prothrombin Time 13.9  11.6 - 15.2 seconds   INR 1.09  0.00 - 1.49  APTT      Result Value Range   aPTT 30  24 - 37 seconds  POCT I-STAT, CHEM 8      Result Value Range   Sodium 140  135 - 145 mEq/L   Potassium 3.4 (*) 3.5 - 5.1 mEq/L   Chloride 96  96 - 112 mEq/L   BUN 25 (*) 6 - 23 mg/dL   Creatinine, Ser 0.27 (*) 0.50 - 1.35 mg/dL   Glucose, Bld 83  70 - 99 mg/dL   Calcium, Ion 2.53 (*) 1.13 - 1.30 mmol/L   TCO2 33  0 - 100 mmol/L   Hemoglobin 12.2 (*) 13.0 - 17.0 g/dL   HCT 66.4 (*) 40.3 - 47.4 %  SAMPLE TO BLOOD BANK      Result Value Range   Blood Bank Specimen SAMPLE AVAILABLE FOR TESTING     Sample Expiration 04/27/2013     Laboratory interpretation all normal except stable anemia, renal failure   Imaging Review No  results found.  EKG Interpretation   None       MDM   1. Complications due to renal dialysis device, implant, and graft, subsequent encounter    TO THE OR  Devoria Albe, MD, FACEP   CRITICAL CARE Performed by: Devoria Albe L Total critical care time: 31 minutes Critical care time was exclusive of separately billable procedures and treating other patients. Critical care was necessary to treat or prevent imminent or life-threatening deterioration. Critical care was time spent personally by me on the following activities: development of treatment plan with patient and/or surrogate as well as nursing, discussions with consultants, evaluation of patient's response to treatment, examination of patient, obtaining history from patient or surrogate, ordering and performing treatments and interventions, ordering and  review of laboratory studies, ordering and review of radiographic studies, pulse oximetry and re-evaluation of patient's condition.     Ward Givens, MD 04/26/13 [phone]

## 2013-04-26 NOTE — Consult Note (Signed)
[facility] [place] HISTORY AND PHYSICAL   History of Present Illness:  Patient is a 79 y.o. year old male who presents for evaluation of bleeding from a right arm hemodialysis access.  Pt has had several episodes recently.  Large amount of blood loss today. He is demented and apparently never returned for follow up visits.  Bleeding controlled in ER with pressure dressing.  Other medical problems include diabetes, dementia, coronary disease all of which are currently stable.  Past Medical History  Diagnosis Date  . Diabetes mellitus   . Hypertension   . Arthritis   . Cataract   . CAD (coronary artery disease)   . Secondary hyperthyroidism   . Dementia   . History of thrombocytopenia   . GERD (gastroesophageal reflux disease)   . Anxiety     paranoia  . Stroke   . Chronic kidney disease     Tue, Thurs, Sat dialysis, goes to [address]  . Anemia     Past Surgical History  Procedure Laterality Date  . Arteriovenous graft placement  05/01/10    Left upper arm brachial artery to axillary vein AVG  . Thrombectomy / arteriovenous graft revision  11/07/10    Left upper arm AVG  by Dr. Imogene Burn  . Diatek catheter insertion  12/05/10    Left side  . Bone spur      foot surgery  . Av fistula placement  04/14/2011    Procedure: ARTERIOVENOUS (AV) FISTULA CREATION;  Surgeon: Nilda Simmer, MD;  Location: [facility] OR;  Service: Vascular;  Laterality: Right;   Second  stage basilic vein transposition  . Knee surgery Left   . Foot surgery Left   . Bunionectomy      Social History History  Substance Use Topics  . Smoking status: Never Smoker   . Smokeless tobacco: Never Used  . Alcohol Use: Yes     Comment: approx 30 years ago    Family History No family history on file.  Allergies  No Known Allergies   No current facility-administered medications for this encounter.   Current Outpatient Prescriptions  Medication Sig Dispense Refill  . B  Complex-C-Biotin-E-FA (RENATABS) 1 MG TABS Take 1 tablet by mouth daily.      . bisacodyl (DULCOLAX) 10 MG suppository Place 10 mg rectally every three (3) days as needed (for no bowel movement).       . calcium acetate (PHOSLO) 667 MG capsule Take 667 mg by mouth 3 (three) times daily with meals.       . cinacalcet (SENSIPAR) 90 MG tablet Take 180 mg by mouth daily at 12 noon.       . docusate sodium (COLACE) 100 MG capsule Take 200 mg by mouth at bedtime.       Marland Kitchen escitalopram (LEXAPRO) 20 MG tablet Take 20 mg by mouth daily at 12 noon.      . ethyl chloride spray Apply 1 application topically See admin instructions. Apply to dialysis site prior to dialysis      . midodrine (PROAMATINE) 10 MG tablet Take 10 mg by mouth 3 (three) times daily.      . multivitamin (RENA-VIT) TABS tablet Take 1 tablet by mouth daily.      Marland Kitchen omeprazole (PRILOSEC) 20 MG capsule Take 20 mg by mouth daily.      Marland Kitchen oxycodone (OXY-IR) 5 MG capsule Take 5 mg by mouth every 12 (twelve) hours as needed for pain.       Marland Kitchen  risperiDONE (RISPERDAL) 0.25 MG tablet Take 0.25 mg by mouth at bedtime. Do not give prior to dialysis      . simvastatin (ZOCOR) 20 MG tablet Take 20 mg by mouth at bedtime.       . Sucroferric Oxyhydroxide (VELPHORO) 500 MG CHEW Chew 1,000 mg by mouth 2 (two) times daily with a meal.        ROS:   Unable to obtain due to pt dementia  Physical Examination  Filed Vitals:   04/26/13 1815  BP: 88/48  Pulse: 96  Temp: 98.4 F (36.9 C)  TempSrc: Oral  Resp: 18  SpO2: 98%    There is no weight on file to calculate BMI.  General:  Alert and oriented, no acute distress Extremity: 2 cm hole over right arm AV fistula proximal third with active bleeding  ASSESSMENT:  Bleeding AV Fistula with hemodynamic instabilty.   PLAN:  To OR for ligation of right arm fistula and placement diatek catheter.  Attempted to contact pt son by phone no answer.  Will do emergency consent.  Fabienne Bruns,  MD [facility] [place] Office: [phone] Pager: [phone]

## 2013-04-26 NOTE — Anesthesia Postprocedure Evaluation (Signed)
  Anesthesia Post-op Note  Patient: [name]  Procedure(s) Performed: Procedure(s): LIGATION OF ARTERIOVENOUS  FISTULA (Right) INSERTION OF DIALYSIS CATHETER (Left)  Patient Location: PACU  Anesthesia Type:General  Level of Consciousness: awake   Airway and Oxygen Therapy: Patient Spontanous Breathing  Post-op Pain: none  Post-op Assessment: Post-op Vital signs reviewed, Patient's Cardiovascular Status Stable and Respiratory Function Stable  Post-op Vital Signs: Reviewed  Filed Vitals:   04/26/13 2300  BP: 84/42  Pulse: 80  Temp:   Resp: 20    Complications: No apparent anesthesia complications

## 2013-04-26 NOTE — Op Note (Signed)
Procedure: #1 ligation of right basilic vein fistula #2 ultrasound of the neck and placement of left internal jugular vein Diatek catheter  Preoperative diagnosis: Hemorrhage right arm AV fistula  Postoperative diagnosis: Same  Anesthesia: Gen.  Assistant: Nurse  Indications: Patient is a 79 year old male found to have bleeding from her right upper arm AV fistula earlier today.  Operative findings: Large erosion right upper arm AV fistula, 23 cm Diatek catheter left internal jugular vein  Operative details: After obtaining informed consent, the patient was taken to the operating room. The patient was placed in supine position on the operating room table. After induction of general anesthesia and endotracheal intubation, the patient's entire right upper extremity was prepped and draped in usual sterile fashion. We had to hold pressure above and below the area of bleeding to control hemorrhage during the prep. A large #1 Prolene suture was placed in a figure-of-eight fashion over the area of bleeding to control hemorrhage temporarily. Next a longitudinal incision was made just above the level of the arterial anastomosis. The fistula was dissected free circumferentially in this location. It was approximately 12 mm in diameter. It was doubly ligated with a 0 silk tie. Attention was then turned to the more distal fistula. A few centimeters above the erosion and a transverse incision was made carried into the subcutaneous tissues down to level the fistula. This was dissected free circumferentially and was ligated with a 0 silk tie. Next the erosion was debrided back to healthy clean skin edges. All 3 incisions were then closed with interrupted vertical mattress nylon sutures. Hemostasis was obtained. A dry sterile dressing was applied to the right arm.  Attention was then turned to placing of a Diatek catheter. Ultrasound was used to check the neck for central vein patency. The right internal jugular vein  could not be visualized and was presumably occluded. The patient had a very stiff neck which would not rotate side to side and would not extend whatsoever. This may placement of a catheter fairly difficult. The left internal jugular vein was visualized and had normal compressibility and respiratory variation. Next the patient's entire neck and chest were prepped and draped in the usual sterile fashion. Using ultrasound guidance the left internal jugular vein was successfully cannulated. An 035 J-wire was then threaded down into the right atrium under fluoroscopic guidance. Sequential 12, 14 and 16 French dilators were placed over the guidewire into the right atrium. The guidewire was then removed with the large dilator in place and an 035 Amplatz wire placed to give a stiffer rail for placement of the catheter. The catheter was then placed through the peel away sheath using an over-the-wire technique. The peel-away sheath was then removed. The catheter was thoroughly flushed with heparinized saline. It was noted to flush and draw easily. The catheter was then tunneled subcutaneously and brought out through a left infraclavicular incision. The catheter was cut to length and the hub attached. The catheter was inspected under fluoroscopy felt its tip to be in the right atrium. The catheter was noted to flush and draw easily. The neck insertion site was closed with a nylon stitch. The catheter was sutured to the skin with nylon sutures. A piece of Gelfoam was placed at the exit site to control some oozing. The patient tolerated the procedure well and there were no complications. Instrument sponge and needle counts were correct at the end of the case. The patient was taken to the recovery room in stable condition.  Leonette Most  Kage Willmann, MD [facility] [place] Office: [phone] Pager: [phone]

## 2013-04-27 ENCOUNTER — Encounter: Payer: Self-pay | Admitting: Vascular Surgery

## 2013-04-27 ENCOUNTER — Telehealth: Payer: Self-pay | Admitting: Vascular Surgery

## 2013-04-27 LAB — COMPREHENSIVE METABOLIC PANEL
ALT: 8 U/L (ref 0–53)
Albumin: 2.8 g/dL — ABNORMAL LOW (ref 3.5–5.2)
Alkaline Phosphatase: 87 U/L (ref 39–117)
Calcium: 8 mg/dL — ABNORMAL LOW (ref 8.4–10.5)
Potassium: 3.8 mEq/L (ref 3.5–5.1)
Sodium: 140 mEq/L (ref 135–145)
Total Protein: 5.5 g/dL — ABNORMAL LOW (ref 6.0–8.3)

## 2013-04-27 LAB — GLUCOSE, CAPILLARY
Glucose-Capillary: 77 mg/dL (ref 70–99)
Glucose-Capillary: 120 mg/dL — ABNORMAL HIGH (ref 70–99)
Glucose-Capillary: 92 mg/dL (ref 70–99)

## 2013-04-27 LAB — CBC
MCHC: 31.8 g/dL (ref 30.0–36.0)
MCV: 94.6 fL (ref 78.0–100.0)
Platelets: 92 10*3/uL — ABNORMAL LOW (ref 150–400)
RBC: 2.76 MIL/uL — ABNORMAL LOW (ref 4.22–5.81)
WBC: 7.9 10*3/uL (ref 4.0–10.5)

## 2013-04-27 LAB — MRSA PCR SCREENING: MRSA by PCR: NEGATIVE

## 2013-04-27 MED ORDER — PANTOPRAZOLE SODIUM 40 MG PO TBEC
40.0000 mg | DELAYED_RELEASE_TABLET | Freq: Every day | ORAL | Status: DC
  Administered 2013-04-27: 40 mg via ORAL
  Filled 2013-04-27: qty 1

## 2013-04-27 MED ORDER — METOPROLOL TARTRATE 1 MG/ML IV SOLN: 2.0000 mg | INTRAVENOUS | Status: DC | PRN

## 2013-04-27 MED ORDER — RENA-VITE PO TABS
1.0000 | ORAL_TABLET | Freq: Every day | ORAL | Status: DC
  Administered 2013-04-27: 1 via ORAL
  Filled 2013-04-27: qty 1

## 2013-04-27 MED ORDER — DOCUSATE SODIUM 100 MG PO CAPS
200.0000 mg | ORAL_CAPSULE | Freq: Every day | ORAL | Status: DC
  Administered 2013-04-27: 200 mg via ORAL
  Filled 2013-04-27 (×2): qty 2

## 2013-04-27 MED ORDER — NEPHRO-VITE 0.8 MG PO TABS
1.0000 | ORAL_TABLET | Freq: Every day | ORAL | Status: DC
  Filled 2013-04-27 (×2): qty 1

## 2013-04-27 MED ORDER — ETHYL CHLORIDE EX AERO
1.0000 "application " | INHALATION_SPRAY | Freq: Every day | CUTANEOUS | Status: DC
  Filled 2013-04-27: qty 1

## 2013-04-27 MED ORDER — ONDANSETRON HCL 4 MG/2ML IJ SOLN: 4.0000 mg | Freq: Four times a day (QID) | INTRAMUSCULAR | Status: DC | PRN

## 2013-04-27 MED ORDER — MIDODRINE HCL 5 MG PO TABS
10.0000 mg | ORAL_TABLET | Freq: Three times a day (TID) | ORAL | Status: DC
  Administered 2013-04-27 (×2): 10 mg via ORAL
  Filled 2013-04-27 (×4): qty 2

## 2013-04-27 MED ORDER — HYDRALAZINE HCL 20 MG/ML IJ SOLN: 10.0000 mg | INTRAMUSCULAR | Status: DC | PRN

## 2013-04-27 MED ORDER — OXYCODONE HCL 5 MG PO TABS
5.0000 mg | ORAL_TABLET | Freq: Two times a day (BID) | ORAL | Status: DC | PRN
  Administered 2013-04-27: 5 mg via ORAL
  Filled 2013-04-27 (×2): qty 1

## 2013-04-27 MED ORDER — OXYCODONE HCL 5 MG PO CAPS: 5.0000 mg | ORAL_CAPSULE | Freq: Two times a day (BID) | ORAL | Status: DC | PRN

## 2013-04-27 MED ORDER — PANTOPRAZOLE SODIUM 40 MG PO TBEC: 40.0000 mg | DELAYED_RELEASE_TABLET | Freq: Every day | ORAL | Status: DC

## 2013-04-27 MED ORDER — CINACALCET HCL 30 MG PO TABS
180.0000 mg | ORAL_TABLET | Freq: Every day | ORAL | Status: DC
  Filled 2013-04-27: qty 6

## 2013-04-27 MED ORDER — ESCITALOPRAM OXALATE 20 MG PO TABS
20.0000 mg | ORAL_TABLET | Freq: Every day | ORAL | Status: DC
  Administered 2013-04-27: 20 mg via ORAL
  Filled 2013-04-27: qty 1

## 2013-04-27 MED ORDER — BISACODYL 10 MG RE SUPP: 10.0000 mg | RECTAL | Status: DC | PRN

## 2013-04-27 MED ORDER — SIMVASTATIN 20 MG PO TABS
20.0000 mg | ORAL_TABLET | Freq: Every day | ORAL | Status: DC
  Administered 2013-04-27: 20 mg via ORAL
  Filled 2013-04-27 (×2): qty 1

## 2013-04-27 MED ORDER — SUCROFERRIC OXYHYDROXIDE 500 MG PO CHEW: 1000.0000 mg | CHEWABLE_TABLET | Freq: Two times a day (BID) | ORAL | Status: DC

## 2013-04-27 MED ORDER — RISPERIDONE 0.25 MG PO TABS
0.2500 mg | ORAL_TABLET | Freq: Every day | ORAL | Status: DC
  Administered 2013-04-27: 0.25 mg via ORAL
  Filled 2013-04-27 (×2): qty 1

## 2013-04-27 MED ORDER — CALCIUM ACETATE 667 MG PO CAPS
667.0000 mg | ORAL_CAPSULE | Freq: Three times a day (TID) | ORAL | Status: DC
  Administered 2013-04-27: 667 mg via ORAL
  Filled 2013-04-27 (×4): qty 1

## 2013-04-27 MED ORDER — LABETALOL HCL 5 MG/ML IV SOLN
10.0000 mg | INTRAVENOUS | Status: DC | PRN
  Filled 2013-04-27: qty 4

## 2013-04-27 NOTE — Telephone Encounter (Addendum)
Message copied by Fredrich Birks on Wed Apr 27, 2013 11:26 AM ------      Message from: Dara Lords      Created: Wed Apr 27, 2013  8:31 AM       S/p ligation of bleeding right arm AVF 04/26/13.  He has appt with [facility] on 04/28/13.  Please cancel that and reschedule him for 2 weeks with Dr. Darrick Penna.            Thanks,      Lelon Mast ------  04/27/13: spoke with Joyce Gross regarding this patient- he has ischemic toes that were to be evaluated by [facility] on 12/11- per Joyce Gross, should probably just keep him on for 04/28/13, dpm

## 2013-04-27 NOTE — Progress Notes (Addendum)
[facility] [place] Postoperative hemodialysis access   Date of Surgery:  04/26/13 Surgeon: Darrick Penna  Subjective:  No complaints  PHYSICAL EXAMINATION:  Filed Vitals:   04/27/13 0500  BP: 88/51  Pulse: 78  Temp: 97.4 F (36.3 C)  Resp: 18    Incisions are c/d/i with nylon sutures in tact Hand grip is good   Sensation in digits is intact;     ASSESSMENT/PLAN:  [name] is a 79 y.o. year old male who is s/p ligation of bleeding AVF RUA and insertion of HD catheter.  -incisions look fine this am with sutures in tact -will discharge back to the SNF today -f/u with Dr. Darrick Penna in 2 weeks. -he dialyzed yesterday    Jerome Massed, PA-C [facility] [phone]  Exam and details as above D/c to SNF  Jerome Bruns, MD [facility] [place] Office: [phone] Pager: [phone]

## 2013-04-27 NOTE — Progress Notes (Signed)
Pt discharged to SNF. Pt remains stable. No signs and symptoms of distress. Discharge summary sent with patient and report was called to the facility. Laverda Sorenson, RN

## 2013-04-27 NOTE — Progress Notes (Signed)
Pt had a run of SVT non sustained while at rest. Pt VS stable. No distress noted. Md made aware. No new orders received will cont to monitor.

## 2013-04-27 NOTE — Discharge Summary (Signed)
[facility] Discharge Summary  [name] 12-25-1931 79 y.o. male  [id number]  Admission Date: 04/26/2013  Discharge Date: 04/27/13  Physician: Sherren Kerns, MD  Admission Diagnosis: Complications due to renal dialysis device, implant, and graft, subsequent encounter [V58.89]   HPI:   This is a 79 y.o. male who presents for evaluation of bleeding from a right arm hemodialysis access. Pt has had several episodes recently. Large amount of blood loss today. He is demented and apparently never returned for follow up visits. Bleeding controlled in ER with pressure dressing. Other medical problems include diabetes, dementia, coronary disease all of which are currently stable.  Hospital Course:  The patient was admitted to the hospital and taken to the operating room on 04/26/2013 - 04/27/2013 and underwent: #1 ligation of right basilic vein fistula #2 ultrasound of the neck and placement of left internal jugular vein Diatek catheter    The pt tolerated the procedure well and was transported to the PACU in good condition. By POD 1, his wounds looked good and were in tact with nylon sutures.  He did have HD yesterday, so will discharge back to SNF today and he can resume his regular scheduled dialysis.  The remainder of the hospital course consisted of increasing mobilization and increasing intake of solids without difficulty.  CBC    Component Value Date/Time   WBC 8.3 04/26/2013 1833   RBC 3.43* 04/26/2013 1833   HGB 9.9* 04/26/2013 2316   HCT 29.0* 04/26/2013 2316   PLT 136* 04/26/2013 1833   MCV 92.4 04/26/2013 1833   MCH 29.7 04/26/2013 1833   MCHC 32.2 04/26/2013 1833   RDW 16.4* 04/26/2013 1833   LYMPHSABS 2.1 04/26/2013 1833   MONOABS 1.0 04/26/2013 1833   EOSABS 0.2 04/26/2013 1833   BASOSABS 0.0 04/26/2013 1833    BMET    Component Value Date/Time   NA 141 04/26/2013 2316   K 2.6* 04/26/2013 2316   CL 96 04/26/2013 1917   CO2 29 04/25/2013 0830   GLUCOSE 130* 04/26/2013 2316   BUN 25* 04/26/2013 1917   CREATININE 4.10* 04/26/2013 1917   CALCIUM 9.4 04/25/2013 0830   GFRNONAA 8* 04/25/2013 0830   GFRAA 9* 04/25/2013 0830     Discharge Instructions:   The patient is discharged to home with extensive instructions on wound care and progressive ambulation.  They are instructed not to drive or perform any heavy lifting until returning to see the physician in his office.  Discharge Orders   Future Appointments Provider Department Dept Phone   04/28/2013 2:00 PM [facility] [place] [facility] [address] [phone]   04/28/2013 2:30 PM Sherren Kerns, MD [facility] [phone]   06/13/2013 1:45 PM Lenn Sink, DPM [facility] [phone]   Future Orders Complete By Expires   Call MD for:  redness, tenderness, or signs of infection (pain, swelling, bleeding, redness, odor or green/yellow discharge around incision site)  As directed    Call MD for:  severe or increased pain, loss or decreased feeling  in affected limb(s)  As directed    Call MD for:  temperature >100.5  As directed    Discharge wound care:  As directed    Comments:     Wash with soap and water daily.  May place dry 4 x 4 gauze and kerlix to keep in place.   Lifting restrictions  As directed    Comments:  No heavy lifting for 3 weeks   may wash over wound with mild soap and water  As directed    Resume previous diet  As directed       Discharge Diagnosis:  Complications due to renal dialysis device, implant, and graft, subsequent encounter [V58.89]  Secondary Diagnosis: Patient Active Problem List   Diagnosis Date Noted  . Dialysis AV fistula malfunction 04/26/2013  . GERD (gastroesophageal reflux disease) 02/21/2013  . Constipation 02/21/2013  . Hyperparathyroidism, secondary renal 02/21/2013  . Depression 12/02/2012  . Generalized weakness 12/02/2012  . End stage renal disease on dialysis  11/29/2010  . HYPERLIPIDEMIA-MIXED 06/07/2010  . SINUS BRADYCARDIA 06/07/2010  . RENAL FAILURE, END STAGE 06/07/2010  . CHEST PAIN-UNSPECIFIED 06/07/2010   Past Medical History  Diagnosis Date  . Diabetes mellitus   . Hypertension   . Arthritis   . Cataract   . CAD (coronary artery disease)   . Secondary hyperthyroidism   . Dementia   . History of thrombocytopenia   . GERD (gastroesophageal reflux disease)   . Anxiety     paranoia  . Stroke   . Chronic kidney disease     Tue, Thurs, Sat dialysis, goes to [address]  . Anemia        Medication List         bisacodyl 10 MG suppository  Commonly known as:  DULCOLAX  Place 10 mg rectally every three (3) days as needed (for no bowel movement).     calcium acetate 667 MG capsule  Commonly known as:  PHOSLO  Take 667 mg by mouth 3 (three) times daily with meals.     cinacalcet 90 MG tablet  Commonly known as:  SENSIPAR  Take 180 mg by mouth daily at 12 noon.     docusate sodium 100 MG capsule  Commonly known as:  COLACE  Take 200 mg by mouth at bedtime.     escitalopram 20 MG tablet  Commonly known as:  LEXAPRO  Take 20 mg by mouth daily at 12 noon.     ethyl chloride spray  Apply 1 application topically See admin instructions. Apply to dialysis site prior to dialysis     midodrine 10 MG tablet  Commonly known as:  PROAMATINE  Take 10 mg by mouth 3 (three) times daily.     multivitamin Tabs tablet  Take 1 tablet by mouth daily.     omeprazole 20 MG capsule  Commonly known as:  PRILOSEC  Take 20 mg by mouth daily.     oxycodone 5 MG capsule  Commonly known as:  OXY-IR  Take 1 capsule (5 mg total) by mouth every 12 (twelve) hours as needed for pain.     RENATABS 1 MG Tabs  Take 1 tablet by mouth daily.     risperiDONE 0.25 MG tablet  Commonly known as:  RISPERDAL  Take 0.25 mg by mouth at bedtime. Do not give prior to dialysis     simvastatin 20 MG tablet  Commonly known as:  ZOCOR  Take 20 mg by  mouth at bedtime.     VELPHORO 500 MG Chew  Generic drug:  Sucroferric Oxyhydroxide  Chew 1,000 mg by mouth 2 (two) times daily with a meal.        Roxicodone #30 No Refill  Disposition: SNF  Patient's condition: is Good  Follow up: 1. Dr. Darrick Penna in 2 weeks   Doreatha Massed, PA-C [facility] [phone] 04/27/2013  8:27 AM

## 2013-04-28 ENCOUNTER — Encounter (HOSPITAL_COMMUNITY): Payer: Medicare Other

## 2013-04-28 ENCOUNTER — Other Ambulatory Visit: Payer: Self-pay | Admitting: *Deleted

## 2013-04-28 ENCOUNTER — Encounter (HOSPITAL_COMMUNITY): Payer: Self-pay | Admitting: Vascular Surgery

## 2013-04-28 ENCOUNTER — Ambulatory Visit: Payer: Medicare Other | Admitting: Vascular Surgery

## 2013-04-28 MED ORDER — OXYCODONE HCL 5 MG PO CAPS: 5.0000 mg | ORAL_CAPSULE | Freq: Two times a day (BID) | ORAL | Status: DC | PRN

## 2013-04-29 ENCOUNTER — Non-Acute Institutional Stay (SKILLED_NURSING_FACILITY): Payer: Medicare Other | Admitting: Internal Medicine

## 2013-04-29 ENCOUNTER — Encounter: Payer: Self-pay | Admitting: Internal Medicine

## 2013-04-29 DIAGNOSIS — K219 Gastro-esophageal reflux disease without esophagitis: Secondary | ICD-10-CM

## 2013-04-29 DIAGNOSIS — E785 Hyperlipidemia, unspecified: Secondary | ICD-10-CM

## 2013-04-29 DIAGNOSIS — F329 Major depressive disorder, single episode, unspecified: Secondary | ICD-10-CM

## 2013-04-29 DIAGNOSIS — N186 End stage renal disease: Secondary | ICD-10-CM

## 2013-04-29 DIAGNOSIS — T82590S Other mechanical complication of surgically created arteriovenous fistula, sequela: Secondary | ICD-10-CM

## 2013-04-29 DIAGNOSIS — T889XXS Complication of surgical and medical care, unspecified, sequela: Secondary | ICD-10-CM

## 2013-04-29 DIAGNOSIS — I959 Hypotension, unspecified: Secondary | ICD-10-CM

## 2013-04-29 NOTE — Progress Notes (Signed)
Patient ID: [name], male   DOB: 01-18-1932, 79 y.o.   MRN: [id number]  [name] living [place]  Code Status: full code  No Known Allergies  Chief Complaint: new admit  HPI:  79 y/o male patient is readmitted to the facility after hospital admission from 04/26/13- 04/27/13 with bleeding from right arm hemodialysis access. Pressure dressing was applied and then he was taken to the OR and underwent ligation of right basilic vein fistula. He also underwent ultrasound guided left jugular vein Diatek catheter placement. He was then discharged back to the facility.  He is seen in his room today. He is tired but in no distress. He denies any complaints  Review of Systems  Constitutional: Negative for fever, chills, weight loss, malaise/fatigue and diaphoresis.  HENT: Negative for congestion, hearing loss and sore throat.   Eyes: Negative for blurred vision, double vision and discharge.  Respiratory: Negative for cough, sputum production, shortness of breath and wheezing.   Cardiovascular: Negative for chest pain, palpitations, orthopnea and leg swelling.  Gastrointestinal: Negative for heartburn, nausea, vomiting, abdominal pain, diarrhea and constipation.  Genitourinary: Negative for dysuria, urgency, frequency and flank pain.  Musculoskeletal: Negative for back pain, falls, joint pain and myalgias.  Skin: Negative for itching and rash.  Neurological: Positive for weakness. Negative for dizziness, tingling, focal weakness and headaches.  Psychiatric/Behavioral: Negative for depression and memory loss. The patient is not nervous/anxious.     Past Medical History  Diagnosis Date  . Diabetes mellitus   . Hypertension   . Arthritis   . Cataract   . CAD (coronary artery disease)   . Secondary hyperthyroidism   . Dementia   . History of thrombocytopenia   . GERD (gastroesophageal reflux disease)   . Anxiety     paranoia  . Stroke   . Chronic kidney disease     Tue, Thurs, Sat  dialysis, goes to [address]  . Anemia    Past Surgical History  Procedure Laterality Date  . Arteriovenous graft placement  05/01/10    Left upper arm brachial artery to axillary vein AVG  . Thrombectomy / arteriovenous graft revision  11/07/10    Left upper arm AVG  by Dr. Imogene Burn  . Diatek catheter insertion  12/05/10    Left side  . Bone spur      foot surgery  . Av fistula placement  04/14/2011    Procedure: ARTERIOVENOUS (AV) FISTULA CREATION;  Surgeon: Nilda Simmer, MD;  Location: [facility] OR;  Service: Vascular;  Laterality: Right;   Second  stage basilic vein transposition  . Knee surgery Left   . Foot surgery Left   . Bunionectomy    . Ligation of arteriovenous  fistula Right 04/26/2013    Procedure: LIGATION OF ARTERIOVENOUS  FISTULA;  Surgeon: Sherren Kerns, MD;  Location: [facility] OR;  Service: Vascular;  Laterality: Right;  . Insertion of dialysis catheter Left 04/26/2013    Procedure: INSERTION OF DIALYSIS CATHETER;  Surgeon: Sherren Kerns, MD;  Location: [facility] OR;  Service: Vascular;  Laterality: Left;   Social History:   reports that he has never smoked. He has never used smokeless tobacco. He reports that he drinks alcohol. He reports that he does not use illicit drugs.  No family history on file.  Medications: Patient's Medications  New Prescriptions   No medications on file  Previous Medications   B COMPLEX-C-BIOTIN-E-FA (RENATABS) 1 MG TABS    Take 1 tablet by mouth daily.  BISACODYL (DULCOLAX) 10 MG SUPPOSITORY    Place 10 mg rectally every three (3) days as needed (for no bowel movement).    CALCIUM ACETATE (PHOSLO) 667 MG CAPSULE    Take 667 mg by mouth 3 (three) times daily with meals.    CINACALCET (SENSIPAR) 90 MG TABLET    Take 180 mg by mouth daily at 12 noon.    DOCUSATE SODIUM (COLACE) 100 MG CAPSULE    Take 200 mg by mouth at bedtime.    ESCITALOPRAM (LEXAPRO) 20 MG TABLET    Take 20 mg by mouth daily at 12 noon.   ETHYL CHLORIDE SPRAY    Apply 1  application topically See admin instructions. Apply to dialysis site prior to dialysis   MIDODRINE (PROAMATINE) 10 MG TABLET    Take 10 mg by mouth 3 (three) times daily.   MULTIVITAMIN (RENA-VIT) TABS TABLET    Take 1 tablet by mouth daily.   OMEPRAZOLE (PRILOSEC) 20 MG CAPSULE    Take 20 mg by mouth daily.   OXYCODONE (OXY-IR) 5 MG CAPSULE    Take 1 capsule (5 mg total) by mouth every 12 (twelve) hours as needed for pain.   RISPERIDONE (RISPERDAL) 0.25 MG TABLET    Take 0.25 mg by mouth at bedtime. Do not give prior to dialysis   SIMVASTATIN (ZOCOR) 20 MG TABLET    Take 20 mg by mouth at bedtime.    SUCROFERRIC OXYHYDROXIDE (VELPHORO) 500 MG CHEW    Chew 1,000 mg by mouth 2 (two) times daily with a meal.  Modified Medications   No medications on file  Discontinued Medications   No medications on file     Physical Exam: Filed Vitals:   04/29/13 1218  BP: 118/72  Pulse: 83  Temp: 97 F (36.1 C)  Resp: 18  Height: 5\' 6"  (1.676 m)  Weight: 190 lb (86.183 kg)  SpO2: 96%   Constitutional: He appears well-developed and well-nourished.  overweight  Neck: Neck supple. No JVD present. No thyromegaly present.   Cardiovascular: Normal rate and regular rhythm.   Respiratory: Effort normal. decreased basilar air entry, no crackles or rhonchi appreciated. No respiratory distress. He has no wheezes.   GI: Soft. Bowel sounds are normal. He exhibits no distension. There is no tenderness.  Musculoskeletal: Normal range of motion. Edema in right arm is minimal, sutures in right arm at site of prior fistula. New dialysis catheter site clean Neurological: He is alert.   Skin: Skin is warm and dry. Sacral wound stage 2 Psychiatric: He has a normal mood and affect.    Labs reviewed: Basic Metabolic Panel:  Recent Labs  45/40/98 0830 04/26/13 1917 04/26/13 2316 04/27/13 0650  NA 141 140 141 140  K 3.6 3.4* 2.6* 3.8  CL 101 96  --  100  CO2 29  --   --  28  GLUCOSE 97 83 130* 77  BUN 34*  25*  --  20  CREATININE 5.96* 4.10*  --  4.48*  CALCIUM 9.4  --   --  8.0*   Liver Function Tests:  Recent Labs  04/27/13 0650  AST 11  ALT 8  ALKPHOS 87  BILITOT 0.4  PROT 5.5*  ALBUMIN 2.8*   No results found for this basename: LIPASE, AMYLASE,  in the last 8760 hours No results found for this basename: AMMONIA,  in the last 8760 hours CBC:  Recent Labs  04/25/13 0830 04/26/13 1833 04/26/13 1917 04/26/13 2316 04/27/13 0945  WBC  8.5 8.3  --   --  7.9  NEUTROABS  --  5.1  --   --   --   HGB 11.7* 10.2* 12.2* 9.9* 8.3*  HCT 36.4* 31.7* 36.0* 29.0* 26.1*  MCV 93.6 92.4  --   --  94.6  PLT 120* 136*  --   --  92*   Assessment/plan  Anemia In setting of blood loss. No active bleed at present. Will recheck cbc  Right arm fistula Has been ligated, has suture in place. Continue skin care  Hypotension Will continue midodrine for now, bp has been better  End stage renal disease on dialysis on dialysis three days per week with1200 cc fluid restriction, phoslo 667 mg three times daily   HYPERLIPIDEMIA Will continue zocor 20 mg daily   Depression continue lexapro 20 mg daily and risperdal  GERD (gastroesophageal reflux disease) Will continue prilosec 20 mg daily   Constipation Will continue colace 200 mg nightly   Sacral decubitus Monitor nutritional status. Continue wound care   Family/ staff Communication: reviewed care plan with patient and nursing supervisor  Goals of care: long term care   Labs/tests ordered: cbc

## 2013-04-29 NOTE — ED Provider Notes (Signed)
I saw and evaluated the patient, reviewed the resident's note and I agree with the findings and plan.  EKG Interpretation   None       Pt with bleeding from dialysis graft.  Had suturing of graft over a week ago and area of ulceration.  However no bleeding here. Dr. Arbie Cookey with vascular came to eval and feels pt is safe for d/c home and f/u next week for suture removal.  Gwyneth Sprout, MD 04/29/13 [phone]

## 2013-04-30 ENCOUNTER — Encounter (HOSPITAL_COMMUNITY): Payer: Self-pay | Admitting: Emergency Medicine

## 2013-04-30 ENCOUNTER — Emergency Department (HOSPITAL_COMMUNITY)
Admission: EM | Admit: 2013-04-30 | Discharge: 2013-04-30 | Disposition: A | Discharge status: Skilled Nursing Facility | Payer: Medicare Other | Source: Home / Self Care | Attending: Emergency Medicine | Admitting: Emergency Medicine

## 2013-04-30 ENCOUNTER — Emergency Department (HOSPITAL_COMMUNITY): Payer: Medicare Other

## 2013-04-30 DIAGNOSIS — Z8673 Personal history of transient ischemic attack (TIA), and cerebral infarction without residual deficits: Secondary | ICD-10-CM | POA: Insufficient documentation

## 2013-04-30 DIAGNOSIS — K219 Gastro-esophageal reflux disease without esophagitis: Secondary | ICD-10-CM | POA: Insufficient documentation

## 2013-04-30 DIAGNOSIS — Z992 Dependence on renal dialysis: Secondary | ICD-10-CM | POA: Insufficient documentation

## 2013-04-30 DIAGNOSIS — K8020 Calculus of gallbladder without cholecystitis without obstruction: Secondary | ICD-10-CM

## 2013-04-30 DIAGNOSIS — M129 Arthropathy, unspecified: Secondary | ICD-10-CM | POA: Insufficient documentation

## 2013-04-30 DIAGNOSIS — F0390 Unspecified dementia without behavioral disturbance: Secondary | ICD-10-CM | POA: Insufficient documentation

## 2013-04-30 DIAGNOSIS — F411 Generalized anxiety disorder: Secondary | ICD-10-CM | POA: Insufficient documentation

## 2013-04-30 DIAGNOSIS — D649 Anemia, unspecified: Secondary | ICD-10-CM | POA: Insufficient documentation

## 2013-04-30 DIAGNOSIS — Z8669 Personal history of other diseases of the nervous system and sense organs: Secondary | ICD-10-CM | POA: Insufficient documentation

## 2013-04-30 DIAGNOSIS — I120 Hypertensive chronic kidney disease with stage 5 chronic kidney disease or end stage renal disease: Secondary | ICD-10-CM | POA: Insufficient documentation

## 2013-04-30 DIAGNOSIS — I2510 Atherosclerotic heart disease of native coronary artery without angina pectoris: Secondary | ICD-10-CM | POA: Insufficient documentation

## 2013-04-30 DIAGNOSIS — Z79899 Other long term (current) drug therapy: Secondary | ICD-10-CM | POA: Insufficient documentation

## 2013-04-30 DIAGNOSIS — Y841 Kidney dialysis as the cause of abnormal reaction of the patient, or of later complication, without mention of misadventure at the time of the procedure: Secondary | ICD-10-CM | POA: Insufficient documentation

## 2013-04-30 DIAGNOSIS — E119 Type 2 diabetes mellitus without complications: Secondary | ICD-10-CM | POA: Insufficient documentation

## 2013-04-30 DIAGNOSIS — T82598A Other mechanical complication of other cardiac and vascular devices and implants, initial encounter: Secondary | ICD-10-CM | POA: Insufficient documentation

## 2013-04-30 DIAGNOSIS — T82590A Other mechanical complication of surgically created arteriovenous fistula, initial encounter: Secondary | ICD-10-CM

## 2013-04-30 DIAGNOSIS — N186 End stage renal disease: Secondary | ICD-10-CM | POA: Insufficient documentation

## 2013-04-30 LAB — CBC WITH DIFFERENTIAL/PLATELET
Basophils Absolute: 0 10*3/uL (ref 0.0–0.1)
Eosinophils Absolute: 0.1 10*3/uL (ref 0.0–0.7)
Eosinophils Relative: 1 % (ref 0–5)
HCT: 30.5 % — ABNORMAL LOW (ref 39.0–52.0)
Lymphocytes Relative: 14 % (ref 12–46)
Lymphs Abs: 1.6 10*3/uL (ref 0.7–4.0)
Monocytes Absolute: 1 10*3/uL (ref 0.1–1.0)
Monocytes Relative: 9 % (ref 3–12)
Neutrophils Relative %: 76 % (ref 43–77)
Platelets: 103 10*3/uL — ABNORMAL LOW (ref 150–400)
RBC: 3.29 MIL/uL — ABNORMAL LOW (ref 4.22–5.81)
WBC: 11.4 10*3/uL — ABNORMAL HIGH (ref 4.0–10.5)

## 2013-04-30 LAB — COMPREHENSIVE METABOLIC PANEL
ALT: 255 U/L — ABNORMAL HIGH (ref 0–53)
AST: 269 U/L — ABNORMAL HIGH (ref 0–37)
Albumin: 3.1 g/dL — ABNORMAL LOW (ref 3.5–5.2)
CO2: 30 mEq/L (ref 19–32)
Calcium: 10.2 mg/dL (ref 8.4–10.5)
Chloride: 99 mEq/L (ref 96–112)
GFR calc Af Amer: 9 mL/min — ABNORMAL LOW (ref >90)
GFR calc non Af Amer: 8 mL/min — ABNORMAL LOW (ref >90)
Potassium: 3.9 mEq/L (ref 3.5–5.1)
Sodium: 141 mEq/L (ref 135–145)
Total Bilirubin: 0.5 mg/dL (ref 0.3–1.2)

## 2013-04-30 NOTE — ED Notes (Signed)
This nurse again attempting to call [facility] for report. Instructed PTAR to have [facility] call this nurse for report.

## 2013-04-30 NOTE — ED Notes (Signed)
Per EMS- pt is from [facility]. Pt has catheter in upper left chest. Pt began having bleeding around site after dialysis accessed port. Pt did not receive dialysis. bleeding stopped with EMS. No other complaints with EMS. 120/70. 90hr. Pt is from [facility] [address].

## 2013-04-30 NOTE — ED Notes (Signed)
[facility] called for transport back to nursing home. 

## 2013-04-30 NOTE — ED Notes (Signed)
Pt placed on 2L for O2sats at 85% increased to 97%.

## 2013-04-30 NOTE — ED Notes (Signed)
Pt has minimal bleeding upon arrival. Denies pain or other symptoms.

## 2013-04-30 NOTE — ED Notes (Signed)
This nurse attempted to call report to [facility]. No answer

## 2013-04-30 NOTE — ED Provider Notes (Signed)
CSN: [id number]     Arrival date & time 04/30/13  0900 History   First MD Initiated Contact with Patient 04/30/13 [phone]     Chief Complaint  Patient presents with  . Bleeding/Bruising    HPI  Patient presents from his nursing facility with concerns of bleeding from dialysis catheter. Patient had his left upper chest catheter placed last week. Per report there has been several episodes of bleeding over the interval. The patient self is a poor account of what is happening, as he has dementia. Level V caveat. On repeat exam, the patient also complains of generalized discomfort, nausea, anorexia, without focal pain.   Past Medical History  Diagnosis Date  . Diabetes mellitus   . Hypertension   . Arthritis   . Cataract   . CAD (coronary artery disease)   . Secondary hyperthyroidism   . Dementia   . History of thrombocytopenia   . GERD (gastroesophageal reflux disease)   . Anxiety     paranoia  . Stroke   . Chronic kidney disease     Tue, Thurs, Sat dialysis, goes to [address]  . Anemia    Past Surgical History  Procedure Laterality Date  . Arteriovenous graft placement  05/01/10    Left upper arm brachial artery to axillary vein AVG  . Thrombectomy / arteriovenous graft revision  11/07/10    Left upper arm AVG  by Dr. Imogene Burn  . Diatek catheter insertion  12/05/10    Left side  . Bone spur      foot surgery  . Av fistula placement  04/14/2011    Procedure: ARTERIOVENOUS (AV) FISTULA CREATION;  Surgeon: Nilda Simmer, MD;  Location: [facility] OR;  Service: Vascular;  Laterality: Right;   Second  stage basilic vein transposition  . Knee surgery Left   . Foot surgery Left   . Bunionectomy    . Ligation of arteriovenous  fistula Right 04/26/2013    Procedure: LIGATION OF ARTERIOVENOUS  FISTULA;  Surgeon: Sherren Kerns, MD;  Location: [facility] OR;  Service: Vascular;  Laterality: Right;  . Insertion of dialysis catheter Left 04/26/2013    Procedure: INSERTION OF DIALYSIS  CATHETER;  Surgeon: Sherren Kerns, MD;  Location: [facility] OR;  Service: Vascular;  Laterality: Left;   No family history on file. History  Substance Use Topics  . Smoking status: Never Smoker   . Smokeless tobacco: Never Used  . Alcohol Use: Yes     Comment: approx 30 years ago    Review of Systems  Unable to perform ROS: Dementia    Allergies  Review of patient's allergies indicates no known allergies.  Home Medications   Current Outpatient Rx  Name  Route  Sig  Dispense  Refill  . acetaminophen (TYLENOL) 650 MG CR tablet   Oral   Take 650 mg by mouth every 4 (four) hours as needed for pain or fever (and headache).         . Amino Acids-Protein Hydrolys (FEEDING SUPPLEMENT, PRO-STAT SUGAR FREE 64,) LIQD   Oral   Take 30 mLs by mouth 2 (two) times daily.         . B Complex-C-Biotin-E-FA (RENATABS) 1 MG TABS   Oral   Take 1 tablet by mouth daily.         . bisacodyl (DULCOLAX) 10 MG suppository   Rectal   Place 10 mg rectally daily as needed (for no bowel movement).          Marland Kitchen  calcium acetate (PHOSLO) 667 MG capsule   Oral   Take 667 mg by mouth 3 (three) times daily with meals.          . cinacalcet (SENSIPAR) 90 MG tablet   Oral   Take 180 mg by mouth daily at 12 noon.          Marland Kitchen dextrose 5 G chewable tablet   Oral   Chew 37.5 g by mouth as needed for low blood sugar. During dialysis PRN         . diphenhydrAMINE (BENADRYL) 25 MG tablet   Oral   Take 25 mg by mouth as needed for itching.         . docusate sodium (COLACE) 100 MG capsule   Oral   Take 200 mg by mouth at bedtime.          Marland Kitchen doxercalciferol (HECTOROL) 4 MCG/2ML injection   Intravenous   Inject 4 mcg into the vein 3 (three) times a week. During dialysis         . Epoetin Alfa (EPOGEN IJ)   Injection   Inject [phone] Units as directed See admin instructions. 8600 units 3X week x365 days, Weekly Dose 25800 units.         Marland Kitchen escitalopram (LEXAPRO) 10 MG tablet    Oral   Take 10 mg by mouth daily.         Marland Kitchen ethyl chloride spray   Topical   Apply 1 application topically See admin instructions. Apply to dialysis site prior to dialysis         . heparin 1000 UNIT/ML injection   Intravenous   Inject 2,200-2,600 Units into the vein See admin instructions. Arterial Red Port 2200 units postdialysis and Venous Blue Port 2400 units postdialysis. Systemic Bolus 2600 units IVP every treatment.         Marland Kitchen loperamide (IMODIUM) 2 MG capsule   Oral   Take 2 mg by mouth as needed for diarrhea or loose stools.         Marland Kitchen LORazepam (ATIVAN) 0.5 MG tablet   Oral   Take 0.5 mg by mouth every 4 (four) hours as needed for anxiety.         . midodrine (PROAMATINE) 10 MG tablet   Oral   Take 10 mg by mouth 3 (three) times daily.         Marland Kitchen omeprazole (PRILOSEC) 20 MG capsule   Oral   Take 20 mg by mouth daily.         Marland Kitchen oxycodone (OXY-IR) 5 MG capsule   Oral   Take 1 capsule (5 mg total) by mouth every 12 (twelve) hours as needed for pain.   60 capsule   0   . risperiDONE (RISPERDAL) 0.25 MG tablet   Oral   Take 0.25 mg by mouth at bedtime. Every night at bedtime on Mondays, Wednesdays, and Fridays         . simvastatin (ZOCOR) 20 MG tablet   Oral   Take 20 mg by mouth at bedtime.          . Sucroferric Oxyhydroxide (VELPHORO) 500 MG CHEW   Oral   Chew 500 mg by mouth 3 (three) times daily.           BP 129/71  Pulse 80  Temp(Src) 98 F (36.7 C) (Oral)  Resp 22  SpO2 98% Physical Exam  Nursing note and vitals reviewed. Constitutional: He appears well-developed. No distress.  HENT:  Head: Normocephalic and atraumatic.  Eyes: Conjunctivae and EOM are normal.  Cardiovascular: Normal rate and regular rhythm.   Pulmonary/Chest: Effort normal. No stridor. No respiratory distress.  There is a left upper chest dialysis catheter in place, with mild fresh blood about the exit site, but no spreading erythema, no discharge, no  tenderness to palpation  Abdominal: He exhibits no distension. There is no tenderness.  Musculoskeletal: He exhibits no edema.  Neurological: He is alert. No cranial nerve deficit. Coordination normal.  Skin: Skin is warm and dry.  Psychiatric: He has a normal mood and affect. Cognition and memory are impaired.    ED Course  Procedures (including critical care time) Labs Review Labs Reviewed  CBC WITH DIFFERENTIAL - Abnormal; Notable for the following:    WBC 11.4 (*)    RBC 3.29 (*)    Hemoglobin 9.8 (*)    HCT 30.5 (*)    RDW 16.7 (*)    Platelets 103 (*)    Neutro Abs 8.7 (*)    All other components within normal limits  COMPREHENSIVE METABOLIC PANEL - Abnormal; Notable for the following:    Glucose, Bld 109 (*)    BUN 32 (*)    Creatinine, Ser 5.88 (*)    Albumin 3.1 (*)    AST 269 (*)    ALT 255 (*)    Alkaline Phosphatase 131 (*)    GFR calc non Af Amer 8 (*)    GFR calc Af Amer 9 (*)    All other components within normal limits  CG4 I-STAT (LACTIC ACID)   Imaging Review Dg Chest 2 View  04/30/2013   CLINICAL DATA:  Bleeding from the left-sided hemodialysis catheter.  EXAM: CHEST  2 VIEW  COMPARISON:  04/26/2013  FINDINGS: Cardiac silhouette is enlarged but stable. There are small right and moderate left pleural effusions. Central vascular congestion is noted, but there is no overt pulmonary edema. Dependent lower lobe atelectasis is noted adjacent to the pleural effusions.  Left internal jugular tunneled dual lumen central venous catheter is stable. No pneumothorax.  IMPRESSION: 1. Tunneled dual lumen left internal jugular central venous catheter is stable and well positioned. 2. Central vascular congestion is noted, but the pulmonary edema noted previously has resolved. 3. Moderate left and small right pleural effusion with associated dependent atelectasis. This is stable. Stable cardiomegaly.   Electronically Signed   By: Amie Portland M.D.   On: 04/30/2013 10:23   US  Abdomen Complete  04/30/2013   CLINICAL DATA:  Abdominal pain.  Elevated LFTs  EXAM: ULTRASOUND ABDOMEN COMPLETE  COMPARISON:  None.  FINDINGS: Gallbladder:  Small bowel gallstones appreciated within the gallbladder. Gallbladder wall thickness 2.4 mm. There is no evidence of pericholecystic fluid nor a sonographic Murphy's sign.  Common bile duct:  Diameter: 4.2 mm  Liver:  No focal lesion identified. Within normal limits in parenchymal echogenicity. Hepatopetal flow is identified within the portal vein.  IVC:  No abnormality visualized.  Pancreas:  Visualized portion unremarkable.  Spleen:  Size and appearance within normal limits.  Right Kidney:  Length: 8.7 cm. Echogenicity within normal limits. No mass or hydronephrosis visualized. 2 x 2 x 1.8 cm cyst posterior midpole. There is decreased corticomedullary differentiation without evidence of hydronephrosis, calculi, or solid masses.  Left Kidney:  Length: The left kidney was not visualized secondary to overlying bowel gas.  Abdominal aorta:  No aneurysm visualized.  Other findings:  None.  IMPRESSION: 1. Cholelithiasis without evidence of cholecystitis 2.  Benign appearing cyst right kidney 3. Renal findings consistent with the patient's history of chronic renal disease   Electronically Signed   By: Salome Holmes M.D.   On: 04/30/2013 15:09    EKG Interpretation   None      After the initial evaluation I discussed the patient's case with nephrology colleagues.  The physician assistant assisted the patient's catheter, and with assistance from vascular surgery secured this.  Patient's labs notable for transaminitis.  With his description of nausea, anorexia, ultrasound was performed.  This was reassuring for absence of cholecystitis, but demonstrated the presence of gallstones.  Date: Always also discussed with multiple family members.  With no fever, no evidence of distress, and with the patient's access to f/u, he was d/c. MDM   1. Dialysis AV  fistula malfunction, initial encounter   2. Gallstones    This patient presents with concern of bleeding dialysis catheter.  This was stabilized.  Patient's Coumadin and was stable throughout his emergency department course.  Patient's evaluation is most notable for finding of gallstones, transaminitis, with no evidence for acute cholecystitis.  Patient is appropriate for discharge with close outpatient followup, continuation of dialysis in 2 days.    Gerhard Munch, MD 04/30/13 [phone]

## 2013-05-03 ENCOUNTER — Encounter (HOSPITAL_COMMUNITY): Payer: Medicare Other | Admitting: Certified Registered Nurse Anesthetist

## 2013-05-03 ENCOUNTER — Encounter (HOSPITAL_COMMUNITY): Payer: Self-pay | Admitting: Emergency Medicine

## 2013-05-03 ENCOUNTER — Emergency Department (HOSPITAL_COMMUNITY): Payer: Medicare Other

## 2013-05-03 ENCOUNTER — Inpatient Hospital Stay (HOSPITAL_COMMUNITY)
Admission: EM | Admit: 2013-05-03 | Discharge: 2013-05-05 | DRG: 314 | Disposition: A | Discharge status: Nursing Home (Includes ICF) | Payer: Medicare Other | Attending: Internal Medicine | Admitting: Internal Medicine

## 2013-05-03 ENCOUNTER — Inpatient Hospital Stay (HOSPITAL_COMMUNITY): Payer: Medicare Other

## 2013-05-03 ENCOUNTER — Inpatient Hospital Stay (HOSPITAL_COMMUNITY): Payer: Medicare Other | Admitting: Certified Registered Nurse Anesthetist

## 2013-05-03 ENCOUNTER — Encounter (HOSPITAL_COMMUNITY)
Admission: EM | Disposition: A | Discharge status: Nursing Home (Includes ICF) | Payer: Self-pay | Source: Home / Self Care | Attending: Internal Medicine

## 2013-05-03 DIAGNOSIS — T8249XD Other complication of vascular dialysis catheter, subsequent encounter: Secondary | ICD-10-CM

## 2013-05-03 DIAGNOSIS — Y841 Kidney dialysis as the cause of abnormal reaction of the patient, or of later complication, without mention of misadventure at the time of the procedure: Secondary | ICD-10-CM | POA: Diagnosis present

## 2013-05-03 DIAGNOSIS — I9589 Other hypotension: Secondary | ICD-10-CM | POA: Diagnosis present

## 2013-05-03 DIAGNOSIS — E8779 Other fluid overload: Secondary | ICD-10-CM

## 2013-05-03 DIAGNOSIS — R5381 Other malaise: Secondary | ICD-10-CM | POA: Diagnosis present

## 2013-05-03 DIAGNOSIS — F329 Major depressive disorder, single episode, unspecified: Secondary | ICD-10-CM

## 2013-05-03 DIAGNOSIS — E1129 Type 2 diabetes mellitus with other diabetic kidney complication: Secondary | ICD-10-CM | POA: Diagnosis present

## 2013-05-03 DIAGNOSIS — T82898A Other specified complication of vascular prosthetic devices, implants and grafts, initial encounter: Principal | ICD-10-CM | POA: Diagnosis present

## 2013-05-03 DIAGNOSIS — I10 Essential (primary) hypertension: Secondary | ICD-10-CM | POA: Diagnosis present

## 2013-05-03 DIAGNOSIS — I2510 Atherosclerotic heart disease of native coronary artery without angina pectoris: Secondary | ICD-10-CM | POA: Diagnosis present

## 2013-05-03 DIAGNOSIS — D631 Anemia in chronic kidney disease: Secondary | ICD-10-CM | POA: Diagnosis present

## 2013-05-03 DIAGNOSIS — F3289 Other specified depressive episodes: Secondary | ICD-10-CM | POA: Diagnosis present

## 2013-05-03 DIAGNOSIS — D696 Thrombocytopenia, unspecified: Secondary | ICD-10-CM

## 2013-05-03 DIAGNOSIS — K5900 Constipation, unspecified: Secondary | ICD-10-CM

## 2013-05-03 DIAGNOSIS — N186 End stage renal disease: Secondary | ICD-10-CM

## 2013-05-03 DIAGNOSIS — M129 Arthropathy, unspecified: Secondary | ICD-10-CM | POA: Diagnosis present

## 2013-05-03 DIAGNOSIS — N2581 Secondary hyperparathyroidism of renal origin: Secondary | ICD-10-CM

## 2013-05-03 DIAGNOSIS — Z79899 Other long term (current) drug therapy: Secondary | ICD-10-CM

## 2013-05-03 DIAGNOSIS — E8770 Fluid overload, unspecified: Secondary | ICD-10-CM

## 2013-05-03 DIAGNOSIS — E782 Mixed hyperlipidemia: Secondary | ICD-10-CM | POA: Diagnosis present

## 2013-05-03 DIAGNOSIS — K219 Gastro-esophageal reflux disease without esophagitis: Secondary | ICD-10-CM

## 2013-05-03 DIAGNOSIS — F22 Delusional disorders: Secondary | ICD-10-CM | POA: Diagnosis present

## 2013-05-03 DIAGNOSIS — T82590A Other mechanical complication of surgically created arteriovenous fistula, initial encounter: Secondary | ICD-10-CM

## 2013-05-03 DIAGNOSIS — E785 Hyperlipidemia, unspecified: Secondary | ICD-10-CM

## 2013-05-03 DIAGNOSIS — F0390 Unspecified dementia without behavioral disturbance: Secondary | ICD-10-CM

## 2013-05-03 DIAGNOSIS — I495 Sick sinus syndrome: Secondary | ICD-10-CM

## 2013-05-03 DIAGNOSIS — E119 Type 2 diabetes mellitus without complications: Secondary | ICD-10-CM | POA: Diagnosis present

## 2013-05-03 DIAGNOSIS — Z992 Dependence on renal dialysis: Secondary | ICD-10-CM

## 2013-05-03 DIAGNOSIS — D649 Anemia, unspecified: Secondary | ICD-10-CM | POA: Diagnosis present

## 2013-05-03 DIAGNOSIS — R531 Weakness: Secondary | ICD-10-CM

## 2013-05-03 DIAGNOSIS — J811 Chronic pulmonary edema: Secondary | ICD-10-CM

## 2013-05-03 DIAGNOSIS — Z8673 Personal history of transient ischemic attack (TIA), and cerebral infarction without residual deficits: Secondary | ICD-10-CM

## 2013-05-03 DIAGNOSIS — R079 Chest pain, unspecified: Secondary | ICD-10-CM

## 2013-05-03 DIAGNOSIS — I120 Hypertensive chronic kidney disease with stage 5 chronic kidney disease or end stage renal disease: Secondary | ICD-10-CM | POA: Diagnosis present

## 2013-05-03 DIAGNOSIS — F32A Depression, unspecified: Secondary | ICD-10-CM

## 2013-05-03 HISTORY — PX: EXCHANGE OF A DIALYSIS CATHETER: SHX5818

## 2013-05-03 LAB — CBC WITH DIFFERENTIAL/PLATELET
Basophils Absolute: 0 10*3/uL (ref 0.0–0.1)
Eosinophils Relative: 1 % (ref 0–5)
Lymphocytes Relative: 18 % (ref 12–46)
Lymphs Abs: 1.3 10*3/uL (ref 0.7–4.0)
MCV: 91.7 fL (ref 78.0–100.0)
Monocytes Relative: 13 % — ABNORMAL HIGH (ref 3–12)
Neutro Abs: 4.6 10*3/uL (ref 1.7–7.7)
Neutrophils Relative %: 67 % (ref 43–77)
Platelets: 96 10*3/uL — ABNORMAL LOW (ref 150–400)
RBC: 2.76 MIL/uL — ABNORMAL LOW (ref 4.22–5.81)
RDW: 17.2 % — ABNORMAL HIGH (ref 11.5–15.5)
WBC: 6.9 10*3/uL (ref 4.0–10.5)

## 2013-05-03 LAB — BASIC METABOLIC PANEL
CO2: 27 mEq/L (ref 19–32)
Calcium: 8.8 mg/dL (ref 8.4–10.5)
GFR calc Af Amer: 5 mL/min — ABNORMAL LOW (ref >90)
GFR calc non Af Amer: 4 mL/min — ABNORMAL LOW (ref >90)
Potassium: 4.3 mEq/L (ref 3.5–5.1)
Sodium: 140 mEq/L (ref 135–145)

## 2013-05-03 LAB — GLUCOSE, CAPILLARY
Glucose-Capillary: 94 mg/dL (ref 70–99)
Glucose-Capillary: 92 mg/dL (ref 70–99)

## 2013-05-03 LAB — POCT I-STAT, CHEM 8
HCT: 29 % — ABNORMAL LOW (ref 39.0–52.0)
Hemoglobin: 9.9 g/dL — ABNORMAL LOW (ref 13.0–17.0)
Potassium: 3.9 mEq/L (ref 3.5–5.1)
Sodium: 141 mEq/L (ref 135–145)
TCO2: 29 mmol/L (ref 0–100)

## 2013-05-03 LAB — PROTIME-INR: Prothrombin Time: 15.7 seconds — ABNORMAL HIGH (ref 11.6–15.2)

## 2013-05-03 LAB — APTT: aPTT: 46 seconds — ABNORMAL HIGH (ref 24–37)

## 2013-05-03 SURGERY — EXCHANGE OF A DIALYSIS CATHETER
Anesthesia: General | Site: Chest | Laterality: Left

## 2013-05-03 MED ORDER — MIDODRINE HCL 5 MG PO TABS
10.0000 mg | ORAL_TABLET | Freq: Three times a day (TID) | ORAL | Status: DC
  Administered 2013-05-04 – 2013-05-05 (×4): 10 mg via ORAL
  Filled 2013-05-03 (×8): qty 2

## 2013-05-03 MED ORDER — RISPERIDONE 0.25 MG PO TABS
0.2500 mg | ORAL_TABLET | Freq: Every day | ORAL | Status: DC
  Administered 2013-05-04: 0.25 mg via ORAL
  Filled 2013-05-03 (×4): qty 1

## 2013-05-03 MED ORDER — DEXTROSE 5 % IV SOLN
1.5000 g | Freq: Three times a day (TID) | INTRAVENOUS | Status: DC
  Administered 2013-05-03: 1.5 g via INTRAVENOUS

## 2013-05-03 MED ORDER — HEPARIN SODIUM (PORCINE) 1000 UNIT/ML IJ SOLN
INTRAMUSCULAR | Status: DC | PRN
  Administered 2013-05-03: 5 [IU] via INTRAVENOUS

## 2013-05-03 MED ORDER — HYDROMORPHONE HCL PF 1 MG/ML IJ SOLN: 0.2500 mg | INTRAMUSCULAR | Status: DC | PRN

## 2013-05-03 MED ORDER — SUCROFERRIC OXYHYDROXIDE 500 MG PO CHEW: 1000.0000 mg | CHEWABLE_TABLET | Freq: Two times a day (BID) | ORAL | Status: DC

## 2013-05-03 MED ORDER — SUCCINYLCHOLINE CHLORIDE 20 MG/ML IJ SOLN
INTRAMUSCULAR | Status: DC | PRN
  Administered 2013-05-03: 140 mg via INTRAVENOUS

## 2013-05-03 MED ORDER — 0.9 % SODIUM CHLORIDE (POUR BTL) OPTIME
TOPICAL | Status: DC | PRN
  Administered 2013-05-03: 1000 mL

## 2013-05-03 MED ORDER — ESCITALOPRAM OXALATE 20 MG PO TABS
20.0000 mg | ORAL_TABLET | Freq: Every day | ORAL | Status: DC
  Administered 2013-05-04 – 2013-05-05 (×2): 20 mg via ORAL
  Filled 2013-05-03 (×2): qty 1

## 2013-05-03 MED ORDER — SODIUM CHLORIDE 0.9 % IR SOLN
Status: DC | PRN
  Administered 2013-05-03: 17:00:00

## 2013-05-03 MED ORDER — LORAZEPAM 0.5 MG PO TABS: 0.5000 mg | ORAL_TABLET | ORAL | Status: DC | PRN

## 2013-05-03 MED ORDER — OXYCODONE HCL 5 MG PO TABS: 5.0000 mg | ORAL_TABLET | Freq: Once | ORAL | Status: DC | PRN

## 2013-05-03 MED ORDER — DARBEPOETIN ALFA-POLYSORBATE 100 MCG/0.5ML IJ SOLN
100.0000 ug | INTRAMUSCULAR | Status: DC
  Administered 2013-05-04: 100 ug via INTRAVENOUS
  Filled 2013-05-03: qty 0.5

## 2013-05-03 MED ORDER — PRO-STAT SUGAR FREE PO LIQD
30.0000 mL | Freq: Two times a day (BID) | ORAL | Status: DC
  Administered 2013-05-04 – 2013-05-05 (×3): 30 mL via ORAL
  Filled 2013-05-03 (×4): qty 30

## 2013-05-03 MED ORDER — ACETAMINOPHEN 325 MG PO TABS: 650.0000 mg | ORAL_TABLET | ORAL | Status: DC | PRN

## 2013-05-03 MED ORDER — OXYCODONE HCL 5 MG/5ML PO SOLN: 5.0000 mg | Freq: Once | ORAL | Status: DC | PRN

## 2013-05-03 MED ORDER — MORPHINE SULFATE 2 MG/ML IJ SOLN
1.0000 mg | INTRAMUSCULAR | Status: DC | PRN
  Filled 2013-05-03: qty 1

## 2013-05-03 MED ORDER — PROMETHAZINE HCL 25 MG/ML IJ SOLN: 6.2500 mg | INTRAMUSCULAR | Status: DC | PRN

## 2013-05-03 MED ORDER — PHENYLEPHRINE HCL 10 MG/ML IJ SOLN
10.0000 mg | INTRAVENOUS | Status: DC | PRN
  Administered 2013-05-03: 40 ug/min via INTRAVENOUS

## 2013-05-03 MED ORDER — CALCIUM ACETATE 667 MG PO CAPS
667.0000 mg | ORAL_CAPSULE | Freq: Three times a day (TID) | ORAL | Status: DC
  Administered 2013-05-04 – 2013-05-05 (×2): 667 mg via ORAL
  Filled 2013-05-03 (×8): qty 1

## 2013-05-03 MED ORDER — RENA-VITE PO TABS
1.0000 | ORAL_TABLET | Freq: Every day | ORAL | Status: DC
  Filled 2013-05-03 (×3): qty 1

## 2013-05-03 MED ORDER — SIMVASTATIN 20 MG PO TABS
20.0000 mg | ORAL_TABLET | Freq: Every day | ORAL | Status: DC
  Filled 2013-05-03 (×3): qty 1

## 2013-05-03 MED ORDER — HEPARIN SODIUM (PORCINE) 1000 UNIT/ML IJ SOLN
INTRAMUSCULAR | Status: AC
  Filled 2013-05-03: qty 1

## 2013-05-03 MED ORDER — PANTOPRAZOLE SODIUM 40 MG PO TBEC
40.0000 mg | DELAYED_RELEASE_TABLET | Freq: Every day | ORAL | Status: DC
  Administered 2013-05-04 – 2013-05-05 (×2): 40 mg via ORAL
  Filled 2013-05-03 (×2): qty 1

## 2013-05-03 MED ORDER — LIDOCAINE HCL (CARDIAC) 20 MG/ML IV SOLN
INTRAVENOUS | Status: DC | PRN
  Administered 2013-05-03: 70 mg via INTRAVENOUS

## 2013-05-03 MED ORDER — DEXTROSE 5 % IV SOLN
INTRAVENOUS | Status: AC
  Filled 2013-05-03: qty 1.5

## 2013-05-03 MED ORDER — MEPERIDINE HCL 25 MG/ML IJ SOLN: 6.2500 mg | INTRAMUSCULAR | Status: DC | PRN

## 2013-05-03 MED ORDER — DOCUSATE SODIUM 100 MG PO CAPS
200.0000 mg | ORAL_CAPSULE | Freq: Every day | ORAL | Status: DC
  Administered 2013-05-04: 200 mg via ORAL
  Filled 2013-05-03 (×2): qty 2

## 2013-05-03 MED ORDER — CINACALCET HCL 30 MG PO TABS: 90.0000 mg | ORAL_TABLET | Freq: Every day | ORAL | Status: DC

## 2013-05-03 MED ORDER — SODIUM CHLORIDE 0.9 % IV SOLN
INTRAVENOUS | Status: DC
  Administered 2013-05-03: 16:00:00 via INTRAVENOUS

## 2013-05-03 MED ORDER — CINACALCET HCL 30 MG PO TABS
180.0000 mg | ORAL_TABLET | Freq: Every day | ORAL | Status: DC
Start: 2013-05-04 — End: 2013-05-05
  Administered 2013-05-04 – 2013-05-05 (×2): 180 mg via ORAL
  Filled 2013-05-03 (×2): qty 6

## 2013-05-03 MED ORDER — ONDANSETRON HCL 4 MG/2ML IJ SOLN: 4.0000 mg | Freq: Four times a day (QID) | INTRAMUSCULAR | Status: DC | PRN

## 2013-05-03 MED ORDER — RENA-VITE PO TABS: 1.0000 | ORAL_TABLET | Freq: Every day | ORAL | Status: DC

## 2013-05-03 MED ORDER — ONDANSETRON HCL 4 MG/2ML IJ SOLN
INTRAMUSCULAR | Status: DC | PRN
  Administered 2013-05-03: 4 mg via INTRAVENOUS

## 2013-05-03 MED ORDER — ONDANSETRON HCL 4 MG PO TABS: 4.0000 mg | ORAL_TABLET | Freq: Four times a day (QID) | ORAL | Status: DC | PRN

## 2013-05-03 MED ORDER — PROPOFOL 10 MG/ML IV BOLUS
INTRAVENOUS | Status: DC | PRN
  Administered 2013-05-03: 100 mg via INTRAVENOUS

## 2013-05-03 MED ORDER — BISACODYL 10 MG RE SUPP: 10.0000 mg | Freq: Every day | RECTAL | Status: DC | PRN

## 2013-05-03 SURGICAL SUPPLY — 46 items
ADH SKN CLS APL DERMABOND .7 (GAUZE/BANDAGES/DRESSINGS) ×1
BAG DECANTER FOR FLEXI CONT (MISCELLANEOUS) ×2 IMPLANT
BLADE SURG 11 STRL SS (BLADE) ×1 IMPLANT
CATH CANNON HEMO 15F 50CM (CATHETERS) IMPLANT
CATH CANNON HEMO 15FR 19 (HEMODIALYSIS SUPPLIES) IMPLANT
CATH CANNON HEMO 15FR 23CM (HEMODIALYSIS SUPPLIES) IMPLANT
CATH CANNON HEMO 15FR 31CM (HEMODIALYSIS SUPPLIES) IMPLANT
CATH CANNON HEMO 15FR 32 (HEMODIALYSIS SUPPLIES) IMPLANT
CATH CANNON HEMO 15FR 32CM (HEMODIALYSIS SUPPLIES) IMPLANT
CHLORAPREP W/TINT 26ML (MISCELLANEOUS) ×2 IMPLANT
COVER PROBE W GEL 5X96 (DRAPES) ×2 IMPLANT
COVER SURGICAL LIGHT HANDLE (MISCELLANEOUS) ×2 IMPLANT
DECANTER SPIKE VIAL GLASS SM (MISCELLANEOUS) ×2 IMPLANT
DERMABOND ADVANCED (GAUZE/BANDAGES/DRESSINGS) ×1
DERMABOND ADVANCED .7 DNX12 (GAUZE/BANDAGES/DRESSINGS) IMPLANT
DRAPE C-ARM 42X72 X-RAY (DRAPES) ×2 IMPLANT
DRAPE CHEST BREAST 15X10 FENES (DRAPES) ×2 IMPLANT
GAUZE SPONGE 4X4 16PLY XRAY LF (GAUZE/BANDAGES/DRESSINGS) ×2 IMPLANT
GLOVE BIO SURGEON STRL SZ 6.5 (GLOVE) ×3 IMPLANT
GLOVE BIO SURGEON STRL SZ7.5 (GLOVE) ×2 IMPLANT
GLOVE ECLIPSE 6.5 STRL STRAW (GLOVE) ×1 IMPLANT
GLOVE ECLIPSE 7.0 STRL STRAW (GLOVE) ×2 IMPLANT
GOWN PREVENTION PLUS XLARGE (GOWN DISPOSABLE) ×2 IMPLANT
GOWN STRL NON-REIN LRG LVL3 (GOWN DISPOSABLE) ×6 IMPLANT
GUIDEWIRE AMPLATZ STIFF 0.35 (WIRE) ×1 IMPLANT
KIT BASIN OR (CUSTOM PROCEDURE TRAY) ×2 IMPLANT
KIT ROOM TURNOVER OR (KITS) ×2 IMPLANT
NDL 18GX1X1/2 (RX/OR ONLY) (NEEDLE) ×1 IMPLANT
NDL HYPO 25GX1X1/2 BEV (NEEDLE) ×1 IMPLANT
NEEDLE 18GX1X1/2 (RX/OR ONLY) (NEEDLE) ×4 IMPLANT
NEEDLE HYPO 25GX1X1/2 BEV (NEEDLE) ×2 IMPLANT
NS IRRIG 1000ML POUR BTL (IV SOLUTION) ×2 IMPLANT
PACK SURGICAL SETUP 50X90 (CUSTOM PROCEDURE TRAY) ×2 IMPLANT
PAD ARMBOARD 7.5X6 YLW CONV (MISCELLANEOUS) ×4 IMPLANT
SPONGE GAUZE 4X4 12PLY STER LF (GAUZE/BANDAGES/DRESSINGS) ×1 IMPLANT
SUT ETHILON 3 0 PS 1 (SUTURE) ×2 IMPLANT
SUT VICRYL 4-0 PS2 18IN ABS (SUTURE) ×2 IMPLANT
SYR 20CC LL (SYRINGE) ×4 IMPLANT
SYR 30ML LL (SYRINGE) IMPLANT
SYR 5ML LL (SYRINGE) ×4 IMPLANT
SYR CONTROL 10ML LL (SYRINGE) ×2 IMPLANT
SYRINGE 10CC LL (SYRINGE) ×2 IMPLANT
TAPE CLOTH SURG 4X10 WHT LF (GAUZE/BANDAGES/DRESSINGS) ×2 IMPLANT
TOWEL OR 17X24 6PK STRL BLUE (TOWEL DISPOSABLE) ×2 IMPLANT
TOWEL OR 17X26 10 PK STRL BLUE (TOWEL DISPOSABLE) ×2 IMPLANT
WATER STERILE IRR 1000ML POUR (IV SOLUTION) ×2 IMPLANT

## 2013-05-03 NOTE — Consult Note (Deleted)
[facility] Renal Consultation Note    Indication for Consultation:  Management of ESRD/hemodialysis; anemia, hypertension/volume and secondary hyperparathyroidism  HPI: [name] is a 79 y.o. male ESRD patient (TTS HD) with dementia and multiple medical problems who is status post ligation of his right upper arm basilic vein transposition and placement of a diatek catheter 12/9-12/10 per Dr. Darrick Penna.  He presents to the ED today secondary to bleeding from his hemodialysis catheter at the outpatient center. He was sent to the ED from his outpatient center on 12/13 for a similar bleeding incident. The vascular surgeon was consulted and advised local management and replacement of the catheter if further issues arose.  An attempt was made today to initiate outpatient hemodialysis, but unfortunately he bled again.  Per reports from outpatient hemodialysis staff, there is some concern that there may be a hole in the device as bleeding is well controlled once he is disconnected.  His last full hemodialysis session was on 04/28/13. K+ is 3.9.  However pulmonary edema is noted on chest xray, likely a result of missed treatment and his ongoing issues with UF-limiting hypotension.  At the time of this encounter he is drowsy, close to his baseline and without complaints.  Past Medical History  Diagnosis Date  . Diabetes mellitus   . Hypertension   . Arthritis   . Cataract   . CAD (coronary artery disease)   . Secondary hyperthyroidism   . Dementia   . History of thrombocytopenia   . GERD (gastroesophageal reflux disease)   . Anxiety     paranoia  . Stroke   . Chronic kidney disease     Tue, Thurs, Sat dialysis, goes to [address]  . Anemia    Past Surgical History  Procedure Laterality Date  . Arteriovenous graft placement  05/01/10    Left upper arm brachial artery to axillary vein AVG  . Thrombectomy / arteriovenous graft revision  11/07/10    Left upper arm AVG  by Dr.  Imogene Burn  . Diatek catheter insertion  12/05/10    Left side  . Bone spur      foot surgery  . Av fistula placement  04/14/2011    Procedure: ARTERIOVENOUS (AV) FISTULA CREATION;  Surgeon: Nilda Simmer, MD;  Location: [facility] OR;  Service: Vascular;  Laterality: Right;   Second  stage basilic vein transposition  . Knee surgery Left   . Foot surgery Left   . Bunionectomy    . Ligation of arteriovenous  fistula Right 04/26/2013    Procedure: LIGATION OF ARTERIOVENOUS  FISTULA;  Surgeon: Sherren Kerns, MD;  Location: [facility] OR;  Service: Vascular;  Laterality: Right;  . Insertion of dialysis catheter Left 04/26/2013    Procedure: INSERTION OF DIALYSIS CATHETER;  Surgeon: Sherren Kerns, MD;  Location: [facility] OR;  Service: Vascular;  Laterality: Left;   No family history on file. Social History:  reports that he has never smoked. He has never used smokeless tobacco. He reports that he drinks alcohol. He reports that he does not use illicit drugs. No Known Allergies Prior to Admission medications   Medication Sig Start Date End Date Taking? Authorizing Provider  acetaminophen (TYLENOL) 650 MG CR tablet Take 650 mg by mouth every 4 (four) hours as needed for pain or fever (and headache).   Yes Historical Provider, MD  Amino Acids-Protein Hydrolys (FEEDING SUPPLEMENT, PRO-STAT SUGAR FREE 64,) LIQD Take 30 mLs by mouth 2 (two) times daily.   Yes  Historical Provider, MD  b complex-vitamin c-folic acid (NEPHRO-VITE) 0.8 MG TABS tablet Take 1 tablet by mouth at bedtime.   Yes Historical Provider, MD  calcium acetate (PHOSLO) 667 MG capsule Take 667 mg by mouth 3 (three) times daily with meals.    Yes Historical Provider, MD  dextrose 5 G chewable tablet Chew 37.5 g by mouth as needed for low blood sugar. During dialysis PRN   Yes Historical Provider, MD  diphenhydrAMINE (BENADRYL) 25 MG tablet Take 25 mg by mouth as needed for itching.   Yes Historical Provider, MD  docusate sodium (COLACE) 100 MG capsule  Take 200 mg by mouth at bedtime.    Yes Historical Provider, MD  escitalopram (LEXAPRO) 20 MG tablet Take 20 mg by mouth daily.   Yes Historical Provider, MD  loperamide (IMODIUM) 2 MG capsule Take 2 mg by mouth as needed for diarrhea or loose stools.   Yes Historical Provider, MD  LORazepam (ATIVAN) 0.5 MG tablet Take 0.5 mg by mouth every 4 (four) hours as needed for anxiety.   Yes Historical Provider, MD  midodrine (PROAMATINE) 10 MG tablet Take 10 mg by mouth 3 (three) times daily.   Yes Historical Provider, MD  Multiple Vitamins-Minerals (MULTIVITAMIN PO) Take 1 tablet by mouth daily.   Yes Historical Provider, MD  oxyCODONE (OXY IR/ROXICODONE) 5 MG immediate release tablet Take 5 mg by mouth 2 (two) times daily as needed for severe pain.   Yes Historical Provider, MD  risperiDONE (RISPERDAL) 0.25 MG tablet Take 0.25 mg by mouth at bedtime.    Yes Historical Provider, MD  simvastatin (ZOCOR) 20 MG tablet Take 20 mg by mouth at bedtime.    Yes Historical Provider, MD  Sucroferric Oxyhydroxide (VELPHORO) 500 MG CHEW Chew 1,000 mg by mouth 2 (two) times daily.    Yes Historical Provider, MD  tuberculin (APLISOL) 5 UNIT/0.1ML injection Inject 5 Units into the skin 2 (two) times a week. *wednesday and Friday*   Yes Historical Provider, MD  bisacodyl (DULCOLAX) 10 MG suppository Place 10 mg rectally daily as needed (for no bowel movement).     Historical Provider, MD  cinacalcet (SENSIPAR) 90 MG tablet Take 180 mg by mouth daily at 12 noon.     Historical Provider, MD  ethyl chloride spray Apply 1 application topically See admin instructions. Apply to dialysis site prior to dialysis (tuesday, Thursday, Saturday)    Historical Provider, MD  omeprazole (PRILOSEC) 20 MG capsule Take 20 mg by mouth daily.    Historical Provider, MD   Current Facility-Administered Medications  Medication Dose Route Frequency Provider Last Rate Last Dose  . 0.9 %  sodium chloride infusion   Intravenous Continuous Brayton Caves, MD 10 mL/hr at 05/03/13 1546    . calcium acetate (PHOSLO) capsule 667 mg  667 mg Oral TID WC Kerin Salen, PA-C      . cefUROXime (ZINACEF) 1.5 g in dextrose 5 % 50 mL IVPB  1.5 g Intravenous Q8H Sherren Kerns, MD      . Melene Muller ON 05/04/2013] cinacalcet (SENSIPAR) tablet 90 mg  90 mg Oral Q lunch Kerin Salen, PA-C      . darbepoetin [facility]) injection 100 mcg  100 mcg Intravenous Q Tue-HD Kerin Salen, PA-C      . dextrose 5 % with cefUROXime (ZINACEF) ADS Med           . midodrine (PROAMATINE) tablet 10 mg  10 mg Oral TID WC Kerin Salen,  PA-C      . multivitamin (RENA-VIT) tablet 1 tablet  1 tablet Oral QHS Kerin Salen, PA-C       Labs: Basic Metabolic Panel:  Recent Labs Lab 04/27/13 0650 04/30/13 1007 05/03/13 0917 05/03/13 1044  NA 140 141 140 141  K 3.8 3.9 4.3 3.9  CL 100 99 99 100  CO2 28 30 27   --   GLUCOSE 77 109* 90 90  BUN 20 32* 57* 58*  CREATININE 4.48* 5.88* 9.78* 9.20*  CALCIUM 8.0* 10.2 8.8  --    Liver Function Tests:  Recent Labs Lab 04/27/13 0650 04/30/13 1007  AST 11 269*  ALT 8 255*  ALKPHOS 87 131*  BILITOT 0.4 0.5  PROT 5.5* 6.6  ALBUMIN 2.8* 3.1*    CBC:  Recent Labs Lab 04/26/13 1833  04/27/13 0945 04/30/13 1007 05/03/13 0917 05/03/13 1044  WBC 8.3  --  7.9 11.4* 6.9  --   NEUTROABS 5.1  --   --  8.7* 4.6  --   HGB 10.2*  < > 8.3* 9.8* 8.3* 9.9*  HCT 31.7*  < > 26.1* 30.5* 25.3* 29.0*  MCV 92.4  --  94.6 92.7 91.7  --   PLT 136*  --  92* 103* 96*  --   < > = values in this interval not displayed.  CBG:  Recent Labs Lab 04/27/13 0034 04/27/13 0729 04/27/13 1129 04/27/13 1704 05/03/13 1605  GLUCAP 108* 77 120* 92 75   Studies/Results: Dg Chest Portable 1 View  05/03/2013   CLINICAL DATA:  79 year old male with shortness of breath. Recently missed dialysis. Initial encounter.  EXAM: PORTABLE CHEST - 1 VIEW  COMPARISON:  04/30/2013 and earlier.  FINDINGS: Portable AP semi upright view at 0937 hrs.  Stable left chest tunneled dual lumen dialysis catheter. Stable cardiomegaly and mediastinal contours. Indistinct perihilar and basilar pulmonary vasculature. Continued confluent retrocardiac opacity. Evidence of small effusions, including fluid in the right minor fissure. No pneumothorax.  IMPRESSION: Basilar predominant pulmonary edema with small pleural effusions and continued confluent retrocardiac opacity - favor atelectasis.   Electronically Signed   By: Augusto Gamble M.D.   On: 05/03/2013 10:01    ROS: Unable to complete  - pt has dementia   Physical Exam: Filed Vitals:   05/03/13 1330 05/03/13 1400 05/03/13 1430 05/03/13 1500  BP: 120/77 105/63 115/62 104/79  Pulse: 77 79 75 82  Temp:      TempSrc:      Resp: 18 24 17 19   SpO2: 100% 99% 100% 99%     General: Somnolent, chronically -ill appearing. NAD Head: Normocephalic, atraumatic, mucus membranes are moist Neck: Supple. JVD not elevated. Lungs: Coarse breath sounds, decreased at bases, No wheezes, rales, or rhonchi. Breathing is unlabored. Heart: RRR with S1 S2. No murmurs, rubs, or gallops appreciated. Abdomen: Soft, non-tender, non-distended with normoactive bowel sounds. No rebound/guarding. No obvious abdominal masses. M-S:  Strength and tone appear normal for age. Lower extremities: +pretibial edema. Compression hose in place.  Neuro: Unable to evaluate, drowsy Moves all extremities spontaneously. Psych:  Exam limited 2/2 patient dememtia Dialysis Access: L TDC with dressing, oozing blood. R  BVT wrapped s/p ligation on 12/9.  Dialysis Orders: Center: [facility] on TTS. EDW 86kg HD Bath 3K/2.5Ca Time 4:00 Heparin 2600. Access L TDC placed 12/9 BFR 400 DFR 800    Hectorol 4 mcg IV/HD Epogen 8600 Units IV/HD  Venofer  0  Recent labs: Hgb 8.5 down from 11.5 on 12/4.  Tsat 65%, P 5.1, PTH 971  Assessment/Plan: 1. Bleeding from Diatek catheter - s/p ligation of R BVT and placement of L TDC on 12/9 by Dr. Darrick Penna. Has had 1 successful  outpatient dialysis. Unable to initiate tx op on 12/13 and 12/16 due to bleeding profuse bleeding which slows to an ooze once he is taken off HD. (possible hole?) VVS consulted and plans are to replace catheter today. 2. ESRD -  TTS. Last HD 12/11 secondary to #1 above. K+ 3.9. HD after catheter replacement. 3. Hypertension/volume  - SBPs 110's. Hypotension at op HD limiting UF. On midodrine TID at home. Has been losing wgt. Pulm edema and effusions on CXR. UF 2-3L as tolerated. Repeat CXR in the am. Lower edw at d/c. 4. Anemia  - Hgb 9.9 on op Epo. Aranesp 100 ordered. Last Tsat 65%. No Fe for now. Follow CBC. 5. Metabolic bone disease -  Ca 8.8, Last P 5.1. ^PTH op. Continue Vit D, Sensipar. Lajoyce Corners is velphoro which is not currently on [facility] formulary. Will order Phoslo for here. 6. Nutrition - NPO pending catheter swap. Then renal diet, multivitamin, ONS  Claud Kelp, PA-C [facility] Pager [phone] 05/03/2013, 4:18 PM

## 2013-05-03 NOTE — ED Notes (Signed)
Phlebotomy unable to get blood  

## 2013-05-03 NOTE — ED Notes (Signed)
Portable xray at bedside.

## 2013-05-03 NOTE — Op Note (Signed)
Procedure: Insertion of Diatek catheter  Preoperative diagnosis: End-stage renal disease  Postoperative diagnosis: Same  Anesthesia: Local with IV sedation  Operative findings: 27 cm Diatek catheter left internal jugular vein  Operative details: Pt demented no family available by phone, implied consent, the patient was taken to the operating room. The patient was placed in supine position on the operating room table. After commencing general anesthesia the patient's entire neck and chest were prepped and draped in usual sterile fashion. The patient was placed in Trendelenburg position.   The patient had a preexisting left side Diatek.  A transverse incision was made over this and the catheter dissected free circumferentially.  The catheter was clamped proximally and distally with hemostats and divided.  The stitch at the skin exit site was removed and the distal catheter removed with gentle traction and passed off the field.  The distal tip of the catheter was cannulated with an 0.035 Amplatz guidewire and threaded into the left internal jugular vein and into the superior vena cava followed by the inferior vena cava under fluoroscopic guidance.   Next sequential 12 and 14 dilators were placed over the guidewire into the right atrium.  A 16 French dilator with a peel-away sheath was then placed over the guidewire into the right atrium.   The guidewire and dilator were removed. A 27 cm Diatek catheter was then placed through the peel away sheath into the right atrium.  The catheter was then tunneled subcutaneously, cut to length, and the hub attached. The catheter was noted to flush and draw easily. The catheter was inspected under fluoroscopy and found with its tip to be in the right atrium without any kinks throughout its course. The catheter was sutured to the skin with nylon sutures. The neck insertion site was closed with a nylon stitch. The old exit site was closed with a nylon stitch.  The catheter  was then loaded with concentrated Heparin solution. A dry sterile dressing was applied.  The patient tolerated procedure well and there were no complications. Instrument sponge and needle counts correct in the case. The patient was taken to the recovery room in stable condition. Chest x-ray will be obtained in the recovery room.  Fabienne Bruns, MD

## 2013-05-03 NOTE — Anesthesia Preprocedure Evaluation (Addendum)
Anesthesia Evaluation  Patient identified by MRN, date of birth, ID band Patient confused  General Assessment Comment:Oriented to person  Reviewed: Allergy & Precautions, H&P , NPO status , Patient's Chart, lab work & pertinent test results  Airway Mallampati: II TM Distance: >3 FB Neck ROM: full    Dental no notable dental hx. (+) Edentulous Upper and Edentulous Lower   Pulmonary neg pulmonary ROS,  breath sounds clear to auscultation  Pulmonary exam normal       Cardiovascular hypertension, Pt. on medications + CAD + dysrhythmias + Valvular Problems/Murmurs Rhythm:regular Rate:Normal + Systolic murmurs and + Diastolic murmurs    Neuro/Psych PSYCHIATRIC DISORDERS Anxiety Depression CVA, No Residual Symptoms negative neurological ROS     GI/Hepatic Neg liver ROS, GERD-  Medicated and Controlled,  Endo/Other  diabetes, Well ControlledHyperthyroidism Diet controlled diabetes  Renal/GU CRF and DialysisRenal disease  negative genitourinary   Musculoskeletal   Abdominal   Peds  Hematology negative hematology ROS (+) anemia ,   Anesthesia Other Findings   Reproductive/Obstetrics negative OB ROS                         Anesthesia Physical  Anesthesia Plan  ASA: III and emergent  Anesthesia Plan: General   Post-op Pain Management:    Induction: Intravenous  Airway Management Planned: Oral ETT  Additional Equipment:   Intra-op Plan:   Post-operative Plan: Extubation in OR  Informed Consent: I have reviewed the patients History and Physical, chart, labs and discussed the procedure including the risks, benefits and alternatives for the proposed anesthesia with the patient or authorized representative who has indicated his/her understanding and acceptance.   Dental advisory given  Plan Discussed with: CRNA, Anesthesiologist and Surgeon  Anesthesia Plan Comments:         Anesthesia  Quick Evaluation

## 2013-05-03 NOTE — Preoperative (Signed)
Beta Blockers   Reason not to administer Beta Blockers:Not Applicable 

## 2013-05-03 NOTE — ED Notes (Signed)
RT at bedside.

## 2013-05-03 NOTE — ED Notes (Signed)
MD at bedside. 

## 2013-05-03 NOTE — Consult Note (Signed)
Pt recently had a fistula ligated and a Diatek catheter placed.  By report there was blood found around his catheter.  Supposedly he has not had dialysis for 10 days.  His coagulation profile is normal  I took down the dressing from his catheter.  There is no blood leaking from the catheter.   If the catheter bleeds again, or he has issues with dialysis, he will need a new catheter. 

## 2013-05-03 NOTE — Anesthesia Procedure Notes (Signed)
Procedure Name: Intubation Date/Time: 05/03/2013 11:22 AM Performed by: Rogelia Boga Pre-anesthesia Checklist: Patient identified, Emergency Drugs available, Suction available, Patient being monitored and Timeout performed Patient Re-evaluated:Patient Re-evaluated prior to inductionOxygen Delivery Method: Circle system utilized Preoxygenation: Pre-oxygenation with 100% oxygen Intubation Type: IV induction and Rapid sequence Laryngoscope Size: Mac and 4 Grade View: Grade I Tube type: Oral Tube size: 7.5 mm Airway Equipment and Method: Stylet Placement Confirmation: ETT inserted through vocal cords under direct vision,  positive ETCO2 and breath sounds checked- equal and bilateral Secured at: 22 cm Tube secured with: Tape Dental Injury: Teeth and Oropharynx as per pre-operative assessment

## 2013-05-03 NOTE — H&P (Deleted)
[facility] History & Physical  CC: Pulmonary edema, complication of hemodialysis access    HPI: [name] is a 79 y.o. male ESRD patient (TTS HD) with dementia and multiple medical problems who is status post ligation of his right upper arm basilic vein transposition and placement of a diatek catheter 12/9-12/10 per Dr. Darrick Penna.  He presents to the ED today secondary to bleeding from his hemodialysis catheter at the outpatient center. He was sent to the ED from his outpatient center on 12/13 for a similar bleeding incident. The vascular surgeon was consulted and advised local management and replacement of the catheter if further issues arose.  An attempt was made today to initiate outpatient hemodialysis, but unfortunately he bled again.  Per reports from outpatient hemodialysis staff, there is some concern that there may be a hole in the device as bleeding is well controlled once he is disconnected.  His last full hemodialysis session was on 04/28/13. K+ is 3.9.  However pulmonary edema is noted on chest xray, likely a result of missed treatment and his ongoing issues with UF-limiting hypotension.  At the time of this encounter he is drowsy, close to his baseline and without complaints.  Past Medical History  Diagnosis Date  . Diabetes mellitus   . Hypertension   . Arthritis   . Cataract   . CAD (coronary artery disease)   . Secondary hyperthyroidism   . Dementia   . History of thrombocytopenia   . GERD (gastroesophageal reflux disease)   . Anxiety     paranoia  . Stroke   . Chronic kidney disease     Tue, Thurs, Sat dialysis, goes to [address]  . Anemia    Past Surgical History  Procedure Laterality Date  . Arteriovenous graft placement  05/01/10    Left upper arm brachial artery to axillary vein AVG  . Thrombectomy / arteriovenous graft revision  11/07/10    Left upper arm AVG  by Dr. Imogene Burn  . Diatek catheter insertion  12/05/10    Left side  . Bone spur       foot surgery  . Av fistula placement  04/14/2011    Procedure: ARTERIOVENOUS (AV) FISTULA CREATION;  Surgeon: Nilda Simmer, MD;  Location[facility] OR;  Service: Vascular;  Laterality: Right;   Second  stage basilic vein transposition  . Knee surgery Left   . Foot surgery Left   . Bunionectomy    . Ligation of arteriovenous  fistula Right 04/26/2013    Procedure: LIGATION OF ARTERIOVENOUS  FISTULA;  Surgeon: Sherren Kerns, MD;  Location: [facility] OR;  Service: Vascular;  Laterality: Right;  . Insertion of dialysis catheter Left 04/26/2013    Procedure: INSERTION OF DIALYSIS CATHETER;  Surgeon: Sherren Kerns, MD;  Location: [facility] OR;  Service: Vascular;  Laterality: Left;   No family history on file. Social History:  reports that he has never smoked. He has never used smokeless tobacco. He reports that he drinks alcohol. He reports that he does not use illicit drugs. No Known Allergies Prior to Admission medications   Medication Sig Start Date End Date Taking? Authorizing Provider  acetaminophen (TYLENOL) 650 MG CR tablet Take 650 mg by mouth every 4 (four) hours as needed for pain or fever (and headache).   Yes Historical Provider, MD  Amino Acids-Protein Hydrolys (FEEDING SUPPLEMENT, PRO-STAT SUGAR FREE 64,) LIQD Take 30 mLs by mouth 2 (two) times daily.   Yes Historical Provider, MD  b  complex-vitamin c-folic acid (NEPHRO-VITE) 0.8 MG TABS tablet Take 1 tablet by mouth at bedtime.   Yes Historical Provider, MD  calcium acetate (PHOSLO) 667 MG capsule Take 667 mg by mouth 3 (three) times daily with meals.    Yes Historical Provider, MD  dextrose 5 G chewable tablet Chew 37.5 g by mouth as needed for low blood sugar. During dialysis PRN   Yes Historical Provider, MD  diphenhydrAMINE (BENADRYL) 25 MG tablet Take 25 mg by mouth as needed for itching.   Yes Historical Provider, MD  docusate sodium (COLACE) 100 MG capsule Take 200 mg by mouth at bedtime.    Yes Historical Provider, MD   escitalopram (LEXAPRO) 20 MG tablet Take 20 mg by mouth daily.   Yes Historical Provider, MD  loperamide (IMODIUM) 2 MG capsule Take 2 mg by mouth as needed for diarrhea or loose stools.   Yes Historical Provider, MD  LORazepam (ATIVAN) 0.5 MG tablet Take 0.5 mg by mouth every 4 (four) hours as needed for anxiety.   Yes Historical Provider, MD  midodrine (PROAMATINE) 10 MG tablet Take 10 mg by mouth 3 (three) times daily.   Yes Historical Provider, MD  Multiple Vitamins-Minerals (MULTIVITAMIN PO) Take 1 tablet by mouth daily.   Yes Historical Provider, MD  oxyCODONE (OXY IR/ROXICODONE) 5 MG immediate release tablet Take 5 mg by mouth 2 (two) times daily as needed for severe pain.   Yes Historical Provider, MD  risperiDONE (RISPERDAL) 0.25 MG tablet Take 0.25 mg by mouth at bedtime.    Yes Historical Provider, MD  simvastatin (ZOCOR) 20 MG tablet Take 20 mg by mouth at bedtime.    Yes Historical Provider, MD  Sucroferric Oxyhydroxide (VELPHORO) 500 MG CHEW Chew 1,000 mg by mouth 2 (two) times daily.    Yes Historical Provider, MD  tuberculin (APLISOL) 5 UNIT/0.1ML injection Inject 5 Units into the skin 2 (two) times a week. *wednesday and Friday*   Yes Historical Provider, MD  bisacodyl (DULCOLAX) 10 MG suppository Place 10 mg rectally daily as needed (for no bowel movement).     Historical Provider, MD  cinacalcet (SENSIPAR) 90 MG tablet Take 180 mg by mouth daily at 12 noon.     Historical Provider, MD  ethyl chloride spray Apply 1 application topically See admin instructions. Apply to dialysis site prior to dialysis (tuesday, Thursday, Saturday)    Historical Provider, MD  omeprazole (PRILOSEC) 20 MG capsule Take 20 mg by mouth daily.    Historical Provider, MD   Current Facility-Administered Medications  Medication Dose Route Frequency Provider Last Rate Last Dose  . 0.9 %  sodium chloride infusion   Intravenous Continuous Brayton Caves, MD 10 mL/hr at 05/03/13 1546    . calcium acetate  (PHOSLO) capsule 667 mg  667 mg Oral TID WC Kerin Salen, PA-C      . cefUROXime (ZINACEF) 1.5 g in dextrose 5 % 50 mL IVPB  1.5 g Intravenous Q8H Sherren Kerns, MD      . Melene Muller ON 05/04/2013] cinacalcet (SENSIPAR) tablet 90 mg  90 mg Oral Q lunch Kerin Salen, PA-C      . darbepoetin [facility]) injection 100 mcg  100 mcg Intravenous Q Tue-HD Kerin Salen, PA-C      . dextrose 5 % with cefUROXime (ZINACEF) ADS Med           . midodrine (PROAMATINE) tablet 10 mg  10 mg Oral TID WC Kerin Salen, PA-C      .  multivitamin (RENA-VIT) tablet 1 tablet  1 tablet Oral QHS Kerin Salen, PA-C       Labs: Basic Metabolic Panel:  Recent Labs Lab 04/27/13 0650 04/30/13 1007 05/03/13 0917 05/03/13 1044  NA 140 141 140 141  K 3.8 3.9 4.3 3.9  CL 100 99 99 100  CO2 28 30 27   --   GLUCOSE 77 109* 90 90  BUN 20 32* 57* 58*  CREATININE 4.48* 5.88* 9.78* 9.20*  CALCIUM 8.0* 10.2 8.8  --    Liver Function Tests:  Recent Labs Lab 04/27/13 0650 04/30/13 1007  AST 11 269*  ALT 8 255*  ALKPHOS 87 131*  BILITOT 0.4 0.5  PROT 5.5* 6.6  ALBUMIN 2.8* 3.1*    CBC:  Recent Labs Lab 04/26/13 1833  04/27/13 0945 04/30/13 1007 05/03/13 0917 05/03/13 1044  WBC 8.3  --  7.9 11.4* 6.9  --   NEUTROABS 5.1  --   --  8.7* 4.6  --   HGB 10.2*  < > 8.3* 9.8* 8.3* 9.9*  HCT 31.7*  < > 26.1* 30.5* 25.3* 29.0*  MCV 92.4  --  94.6 92.7 91.7  --   PLT 136*  --  92* 103* 96*  --   < > = values in this interval not displayed.  CBG:  Recent Labs Lab 04/27/13 0034 04/27/13 0729 04/27/13 1129 04/27/13 1704 05/03/13 1605  GLUCAP 108* 77 120* 92 75   Studies/Results: Dg Chest Portable 1 View  05/03/2013   CLINICAL DATA:  79 year old male with shortness of breath. Recently missed dialysis. Initial encounter.  EXAM: PORTABLE CHEST - 1 VIEW  COMPARISON:  04/30/2013 and earlier.  FINDINGS: Portable AP semi upright view at 0937 hrs. Stable left chest tunneled dual lumen dialysis catheter.  Stable cardiomegaly and mediastinal contours. Indistinct perihilar and basilar pulmonary vasculature. Continued confluent retrocardiac opacity. Evidence of small effusions, including fluid in the right minor fissure. No pneumothorax.  IMPRESSION: Basilar predominant pulmonary edema with small pleural effusions and continued confluent retrocardiac opacity - favor atelectasis.   Electronically Signed   By: Augusto Gamble M.D.   On: 05/03/2013 10:01    ROS: Unable to complete  - pt has dementia   Physical Exam: Filed Vitals:   05/03/13 1330 05/03/13 1400 05/03/13 1430 05/03/13 1500  BP: 120/77 105/63 115/62 104/79  Pulse: 77 79 75 82  Temp:      TempSrc:      Resp: 18 24 17 19   SpO2: 100% 99% 100% 99%     General: Somnolent, chronically -ill appearing. NAD Head: Normocephalic, atraumatic, mucus membranes are moist Neck: Supple. JVD not elevated. Lungs: Coarse breath sounds, decreased at bases, No wheezes, rales, or rhonchi. Breathing is unlabored. Heart: RRR with S1 S2. No murmurs, rubs, or gallops appreciated. Abdomen: Soft, non-tender, non-distended with normoactive bowel sounds. No rebound/guarding. No obvious abdominal masses. M-S:  Strength and tone appear normal for age. Lower extremities: +pretibial edema. Compression hose in place.  Neuro: Unable to evaluate, drowsy Moves all extremities spontaneously. Psych:  Exam limited 2/2 patient dememtia Dialysis Access: L TDC with dressing, oozing blood. R  BVT wrapped s/p ligation on 12/9.  Dialysis Orders: Center: [facility] on TTS. EDW 86kg HD Bath 3K/2.5Ca Time 4:00 Heparin 2600. Access L TDC placed 12/9 BFR 400 DFR 800    Hectorol 4 mcg IV/HD Epogen 8600 Units IV/HD  Venofer  0  Recent labs: Hgb 8.5 down from 11.5 on 12/4. Tsat 65%, P 5.1, PTH 971  Assessment/Plan: 1. Bleeding from Diatek catheter - s/p ligation of R BVT and placement of L TDC on 12/9 by Dr. Darrick Penna. Has had 1 successful outpatient dialysis. Unable to initiate tx op on 12/13 and  12/16 due to bleeding profuse bleeding which slows to an ooze once he is taken off HD. (possible hole?) VVS consulted and plans are to replace catheter today. 2. ESRD -  TTS. Last HD 12/11 secondary to #1 above. K+ 3.9. HD after catheter replacement. 3. Hypertension/volume  - SBPs 110's. Hypotension at op HD limiting UF. On midodrine TID at home. Has been losing wgt. Pulm edema and effusions on CXR. UF 2-3L as tolerated. Repeat CXR in the am. Lower edw at d/c. 4. Anemia  - Hgb 9.9 on op Epo. Aranesp 100 ordered. Last Tsat 65%. No Fe for now. Follow CBC. 5. Metabolic bone disease -  Ca 8.8, Last P 5.1. ^PTH op. Continue Vit D, Sensipar. Lajoyce Corners is velphoro which is not currently on [facility] formulary. Will order Phoslo for here. 6. Nutrition - NPO pending catheter swap. Then renal diet, multivitamin, ONS  Claud Kelp, PA-C [facility] Pager [phone] 05/03/2013, 4:18 PM

## 2013-05-03 NOTE — H&P (View-Only) (Signed)
Pt recently had a fistula ligated and a Diatek catheter placed.  By report there was blood found around his catheter.  Supposedly he has not had dialysis for 10 days.  His coagulation profile is normal  I took down the dressing from his catheter.  There is no blood leaking from the catheter.   If the catheter bleeds again, or he has issues with dialysis, he will need a new catheter.

## 2013-05-03 NOTE — Transfer of Care (Signed)
Immediate Anesthesia Transfer of Care Note  Patient: [name]  Procedure(s) Performed: Procedure(s): EXCHANGE OF A DIALYSIS CATHETER (Left)  Patient Location: PACU  Anesthesia Type:General  Level of Consciousness: awake and patient cooperative  Airway & Oxygen Therapy: Patient Spontanous Breathing and Patient connected to nasal cannula oxygen  Post-op Assessment: Report given to PACU RN, Post -op Vital signs reviewed and stable and Patient moving all extremities X 4  Post vital signs: Reviewed and stable  Complications: No apparent anesthesia complications

## 2013-05-03 NOTE — Clinical Social Work Note (Signed)
LATE ENTRY for 04/26/13 ADMISSION: Patient from [facility] and discharged back to facility on 04/27/13. CSW facilitated [place] via ambulance.  Genelle Bal, MSW, LCSW [phone]

## 2013-05-03 NOTE — Consult Note (Signed)
I have seen and examined this patient and agree with the plan of care. Patient admitted for a left IJ catheter exchange because of bleeding. He had an uneventful catheter exchange  by Dr. Darrick Penna today. He initially had the catheter placed after presenting in a week ago with bleeding over a necrotic area in the right BBF which was then ligated.  Patient has not received dialysis for many days.  He is fluid overloaded with facial edema and anasarca but his saturations are 95% on room air.  He was also seen on dialysis at 1010pm. 2K 2.5Ca bath through a left IJ tunneled catheter with an overlying pressure dressing. F180NR filter. He is lethargic but stable. Arousable with stimuli. 105/75 HR 71 RR14.  Ethelene Hal, MD 05/03/2013, 10:07 PM I have seen and examined this patient and agree with the plan of care.   Ethelene Hal, MD 05/03/2013, 10:07 PM

## 2013-05-03 NOTE — ED Notes (Signed)
Called report to unit 6E.

## 2013-05-03 NOTE — Progress Notes (Signed)
Patient arrived from PACU in a stretcher from procedure,alert to self,non verbal response thought open his eyes and followed on verbal instruction.Left arm contracture noted,no major skin issue.On oxygen at 2lpm/[place],saturating at 94%.Will monitor.

## 2013-05-03 NOTE — ED Provider Notes (Signed)
CSN: [id number]     Arrival date & time 05/03/13  5284 History   First MD Initiated Contact with Patient 05/03/13 [phone]     Chief Complaint  Patient presents with  . Vascular Access Problem   Level 5 caveat for dementia  (Consider location/radiation/quality/duration/timing/severity/associated sxs/prior Treatment) HPI  Patient presents today from the dialysis center. They report his dialysis access in his left subclavian was bleeding from the insertion site today. They were unable to do his dialysis today. They also report he has not had dialysis in 10 days. Patient states he's not feeling good. He states he has some shortness of breath without chest pain. I actually saw patient on 12/9 for heavy bleeding  from his right graft. At that time Dr. Darrick Penna clamped off his right graft and placed the current vascular access.   PCP Dr Ewell Poe.  Past Medical History  Diagnosis Date  . Diabetes mellitus   . Hypertension   . Arthritis   . Cataract   . CAD (coronary artery disease)   . Secondary hyperthyroidism   . Dementia   . History of thrombocytopenia   . GERD (gastroesophageal reflux disease)   . Anxiety     paranoia  . Stroke   . Chronic kidney disease     Tue, Thurs, Sat dialysis, goes to [address]  . Anemia    Past Surgical History  Procedure Laterality Date  . Arteriovenous graft placement  05/01/10    Left upper arm brachial artery to axillary vein AVG  . Thrombectomy / arteriovenous graft revision  11/07/10    Left upper arm AVG  by Dr. Imogene Burn  . Diatek catheter insertion  12/05/10    Left side  . Bone spur      foot surgery  . Av fistula placement  04/14/2011    Procedure: ARTERIOVENOUS (AV) FISTULA CREATION;  Surgeon: Nilda Simmer, MD;  Location: [facility] OR;  Service: Vascular;  Laterality: Right;   Second  stage basilic vein transposition  . Knee surgery Left   . Foot surgery Left   . Bunionectomy    . Ligation of arteriovenous  fistula Right 04/26/2013     Procedure: LIGATION OF ARTERIOVENOUS  FISTULA;  Surgeon: Sherren Kerns, MD;  Location: [facility] OR;  Service: Vascular;  Laterality: Right;  . Insertion of dialysis catheter Left 04/26/2013    Procedure: INSERTION OF DIALYSIS CATHETER;  Surgeon: Sherren Kerns, MD;  Location: [facility] OR;  Service: Vascular;  Laterality: Left;   No family history on file. History  Substance Use Topics  . Smoking status: Never Smoker   . Smokeless tobacco: Never Used  . Alcohol Use: Yes     Comment: approx 30 years ago  lives in [place]  Review of Systems  All other systems reviewed and are negative.    Allergies  Review of patient's allergies indicates no known allergies.  Home Medications   Current Outpatient Rx  Name  Route  Sig  Dispense  Refill  . acetaminophen (TYLENOL) 650 MG CR tablet   Oral   Take 650 mg by mouth every 4 (four) hours as needed for pain or fever (and headache).         . Amino Acids-Protein Hydrolys (FEEDING SUPPLEMENT, PRO-STAT SUGAR FREE 64,) LIQD   Oral   Take 30 mLs by mouth 2 (two) times daily.         . B Complex-C-Biotin-E-FA (RENATABS) 1 MG TABS   Oral   Take 1 tablet  by mouth daily.         . bisacodyl (DULCOLAX) 10 MG suppository   Rectal   Place 10 mg rectally daily as needed (for no bowel movement).          . calcium acetate (PHOSLO) 667 MG capsule   Oral   Take 667 mg by mouth 3 (three) times daily with meals.          . cinacalcet (SENSIPAR) 90 MG tablet   Oral   Take 180 mg by mouth daily at 12 noon.          Marland Kitchen dextrose 5 G chewable tablet   Oral   Chew 37.5 g by mouth as needed for low blood sugar. During dialysis PRN         . diphenhydrAMINE (BENADRYL) 25 MG tablet   Oral   Take 25 mg by mouth as needed for itching.         . docusate sodium (COLACE) 100 MG capsule   Oral   Take 200 mg by mouth at bedtime.          Marland Kitchen doxercalciferol (HECTOROL) 4 MCG/2ML injection   Intravenous   Inject 4 mcg into the vein 3 (three) times a  week. During dialysis         . Epoetin Alfa (EPOGEN IJ)   Injection   Inject [phone] Units as directed See admin instructions. 8600 units 3X week x365 days, Weekly Dose 25800 units.         Marland Kitchen escitalopram (LEXAPRO) 10 MG tablet   Oral   Take 10 mg by mouth daily.         Marland Kitchen ethyl chloride spray   Topical   Apply 1 application topically See admin instructions. Apply to dialysis site prior to dialysis         . heparin 1000 UNIT/ML injection   Intravenous   Inject 2,200-2,600 Units into the vein See admin instructions. Arterial Red Port 2200 units postdialysis and Venous Blue Port 2400 units postdialysis. Systemic Bolus 2600 units IVP every treatment.         Marland Kitchen loperamide (IMODIUM) 2 MG capsule   Oral   Take 2 mg by mouth as needed for diarrhea or loose stools.         Marland Kitchen LORazepam (ATIVAN) 0.5 MG tablet   Oral   Take 0.5 mg by mouth every 4 (four) hours as needed for anxiety.         . midodrine (PROAMATINE) 10 MG tablet   Oral   Take 10 mg by mouth 3 (three) times daily.         Marland Kitchen omeprazole (PRILOSEC) 20 MG capsule   Oral   Take 20 mg by mouth daily.         Marland Kitchen oxycodone (OXY-IR) 5 MG capsule   Oral   Take 1 capsule (5 mg total) by mouth every 12 (twelve) hours as needed for pain.   60 capsule   0   . risperiDONE (RISPERDAL) 0.25 MG tablet   Oral   Take 0.25 mg by mouth at bedtime. Every night at bedtime on Mondays, Wednesdays, and Fridays         . simvastatin (ZOCOR) 20 MG tablet   Oral   Take 20 mg by mouth at bedtime.          . Sucroferric Oxyhydroxide (VELPHORO) 500 MG CHEW   Oral   Chew 500 mg by mouth 3 (three) times daily.  BP 111/81  Pulse 84  Temp(Src) 98 F (36.7 C) (Oral)  Resp 24  SpO2 100%  Vital signs normal   Physical Exam  Nursing note and vitals reviewed. Constitutional: He appears well-developed and well-nourished.  Non-toxic appearance. He does not appear ill. No distress.  HENT:  Head:  Normocephalic and atraumatic.  Right Ear: External ear normal.  Left Ear: External ear normal.  Nose: Nose normal. No mucosal edema or rhinorrhea.  Mouth/Throat: Oropharynx is clear and moist and mucous membranes are normal. No dental abscesses or uvula swelling.  Eyes: Conjunctivae and EOM are normal. Pupils are equal, round, and reactive to light.  Neck: Normal range of motion and full passive range of motion without pain. Neck supple.  Cardiovascular: Normal rate, regular rhythm and normal heart sounds.  Exam reveals no gallop and no friction rub.   No murmur heard. Pulmonary/Chest: Effort normal and breath sounds normal. No respiratory distress. He has no wheezes. He has no rhonchi. He has no rales. He exhibits no tenderness and no crepitus.  Pt has a dialysis vascular catheter in his left chest with blood soaked gauze around it and blood on his skin around the catheter.   Abdominal: Soft. Normal appearance and bowel sounds are normal. He exhibits no distension. There is no tenderness. There is no rebound and no guarding.  Musculoskeletal: Normal range of motion. He exhibits no edema and no tenderness.  Moves all extremities well.   Neurological: He is alert. He has normal strength. No cranial nerve deficit.  Skin: Skin is warm, dry and intact. No rash noted. No erythema. No pallor.  Psychiatric: He has a normal mood and affect. His speech is normal and behavior is normal. His mood appears not anxious.    ED Course  Procedures (including critical care time)  IV nurse states she cleaned his site and it is not actively bleeding now.   11:00 Dr Myra Gianotti will come look at the dialysis catheter site.   Dr Myra Gianotti has examined the catheter, it is not bleeding now, he states the connections are intact. He states if it bleeds again during dialysis they may need to replace this diatek catheter.   PT has had multple problems with his vascular access from both his right arm graft and now his  subclavian  13:11 Dr Arlean Hopping, has reviewed his labs and CXR, wants hospitalist to admit, they will dialyze in the hospital, he will decide if he needs blood later.   13:28 Dr Paris Lore, admit to renal floor, med-surg, team 10   Labs Review Results for orders placed during the hospital encounter of 05/03/13  BASIC METABOLIC PANEL      Result Value Range   Sodium 140  135 - 145 mEq/L   Potassium 4.3  3.5 - 5.1 mEq/L   Chloride 99  96 - 112 mEq/L   CO2 27  19 - 32 mEq/L   Glucose, Bld 90  70 - 99 mg/dL   BUN 57 (*) 6 - 23 mg/dL   Creatinine, Ser 4.54 (*) 0.50 - 1.35 mg/dL   Calcium 8.8  8.4 - 09.8 mg/dL   GFR calc non Af Amer 4 (*) >90 mL/min   GFR calc Af Amer 5 (*) >90 mL/min  CBC WITH DIFFERENTIAL      Result Value Range   WBC 6.9  4.0 - 10.5 K/uL   RBC 2.76 (*) 4.22 - 5.81 MIL/uL   Hemoglobin 8.3 (*) 13.0 - 17.0 g/dL   HCT 11.9 (*)  39.0 - 52.0 %   MCV 91.7  78.0 - 100.0 fL   MCH 30.1  26.0 - 34.0 pg   MCHC 32.8  30.0 - 36.0 g/dL   RDW 08.6 (*) 57.8 - 46.9 %   Platelets 96 (*) 150 - 400 K/uL   Neutrophils Relative % 67  43 - 77 %   Neutro Abs 4.6  1.7 - 7.7 K/uL   Lymphocytes Relative 18  12 - 46 %   Lymphs Abs 1.3  0.7 - 4.0 K/uL   Monocytes Relative 13 (*) 3 - 12 %   Monocytes Absolute 0.9  0.1 - 1.0 K/uL   Eosinophils Relative 1  0 - 5 %   Eosinophils Absolute 0.1  0.0 - 0.7 K/uL   Basophils Relative 0  0 - 1 %   Basophils Absolute 0.0  0.0 - 0.1 K/uL  APTT      Result Value Range   aPTT 46 (*) 24 - 37 seconds  PROTIME-INR      Result Value Range   Prothrombin Time 15.7 (*) 11.6 - 15.2 seconds   INR 1.28  0.00 - 1.49  POCT I-STAT, CHEM 8      Result Value Range   Sodium 141  135 - 145 mEq/L   Potassium 3.9  3.5 - 5.1 mEq/L   Chloride 100  96 - 112 mEq/L   BUN 58 (*) 6 - 23 mg/dL   Creatinine, Ser 6.29 (*) 0.50 - 1.35 mg/dL   Glucose, Bld 90  70 - 99 mg/dL   Calcium, Ion 5.28  4.13 - 1.30 mmol/L   TCO2 29  0 - 100 mmol/L   Hemoglobin 9.9 (*) 13.0 - 17.0  g/dL   HCT 24.4 (*) 01.0 - 27.2 %   Laboratory interpretation all normal except recurrent anemia, renal failure    Imaging Review Dg Chest Portable 1 View  05/03/2013   CLINICAL DATA:  79 year old male with shortness of breath. Recently missed dialysis. Initial encounter.  EXAM: PORTABLE CHEST - 1 VIEW  COMPARISON:  04/30/2013 and earlier.  FINDINGS: Portable AP semi upright view at 0937 hrs. Stable left chest tunneled dual lumen dialysis catheter. Stable cardiomegaly and mediastinal contours. Indistinct perihilar and basilar pulmonary vasculature. Continued confluent retrocardiac opacity. Evidence of small effusions, including fluid in the right minor fissure. No pneumothorax.  IMPRESSION: Basilar predominant pulmonary edema with small pleural effusions and continued confluent retrocardiac opacity - favor atelectasis.   Electronically Signed   By: Augusto Gamble M.D.   On: 05/03/2013 10:01    Dg Chest 2 View  04/30/2013  IMPRESSION: 1. Tunneled dual lumen left internal jugular central venous catheter is stable and well positioned. 2. Central vascular congestion is noted, but the pulmonary edema noted previously has resolved. 3. Moderate left and small right pleural effusion with associated dependent atelectasis. This is stable. Stable cardiomegaly.   Electronically Signed   By: Amie Portland M.D.   On: 04/30/2013 10:23   US Abdomen Complete  04/30/2013     IMPRESSION: 1. Cholelithiasis without evidence of cholecystitis 2. Benign appearing cyst right kidney 3. Renal findings consistent with the patient's history of chronic renal disease   Electronically Signed   By: Salome Holmes M.D.   On: 04/30/2013 15:09   Dg Chest Portable 1 View  05/03/2013   IMPRESSION: Basilar predominant pulmonary edema with small pleural effusions and continued confluent retrocardiac opacity - favor atelectasis.   Electronically Signed   By: Si Gaul.D.  On: 05/03/2013 10:01   Dg Chest Portable 1 View  04/26/2013     cardiomediastinal silhouette is borderline enlarged. No acute osseous abnormalities are identified.  IMPRESSION: 1. Left-sided dual-lumen catheter noted ending about the distal SVC. 2. Increased moderate left-sided pleural effusion, and small right pleural effusion. Associated diffuse bilateral airspace opacification may reflect pulmonary edema or pneumonia. 3. Borderline cardiomegaly.   Electronically Signed   By: Roanna Raider M.D.   On: 04/26/2013 22:47   Dg Fluoro Guide Cv Line-no Report  04/26/2013   CLINICAL DATA: Bleeding Arteriovenous Fistula   FLOURO GUIDE CV LINE  Fluoroscopy was utilized by the requesting physician.  No radiographic  interpretation.       EKG Interpretation    Date/Time:  Tuesday May 03 2013 09:28:07 EST Ventricular Rate:  90 PR Interval:  221 QRS Duration: 107 QT Interval:  375 QTC Calculation: 459 R Axis:   -45 Text Interpretation:  Sinus rhythm Prolonged PR interval LAD, consider left anterior fascicular block Consider anterior infarct Nonspecific T abnormalities, lateral leads No significant change since last tracing Confirmed by Nyko Gell  MD-I, Phyllis Abelson ([id number]) on 05/03/2013 1:37:10 PM            MDM   1. Complications, dialysis, catheter, mechanical, subsequent encounter   2. Fluid overload   3. ESRD needing dialysis   4. Anemia     Plan admission   Devoria Albe, MD, Franz Dell, MD 05/03/13 [phone]

## 2013-05-03 NOTE — ED Notes (Signed)
Per EMS - pt coming from [facility], went to dialysis today and they noticed he was seeping bld around catheter site. Pt has fistula in right arm but hasn't been able to use it d/t issues with it 10 days ago, bleeding and lost a lot of bld per staff there. Pt hasn't had dialysis in 10 days. Were unable to start dialysis today. Rales in lower lobes. Pt has no complaints. Denies sob. 100% on 2 liters [place], was placed on him at dialysis. BP 118/70, center reports his normal BP in 70 systolic. CBG 102, hx of DM type 2. HR 70s with PVCs. Nad, skin warm and dry, resp e/u. Pt's dialysis graft is currently clamped off. Staff at facility report this is his normal baseline status.

## 2013-05-03 NOTE — ED Notes (Signed)
RT stuck x3, no blood back, unable to draw labs.

## 2013-05-03 NOTE — Anesthesia Postprocedure Evaluation (Signed)
  Anesthesia Post-op Note  Patient: [name]  Procedure(s) Performed: Procedure(s): EXCHANGE OF A DIALYSIS CATHETER (Left)  Patient Location: PACU  Anesthesia Type:General  Level of Consciousness: awake, alert , sedated and patient cooperative  Airway and Oxygen Therapy: Patient Spontanous Breathing  Post-op Pain: none  Post-op Assessment: Post-op Vital signs reviewed, Patient's Cardiovascular Status Stable, Respiratory Function Stable, Patent Airway, No signs of Nausea or vomiting and Pain level controlled  Post-op Vital Signs: stable  Complications: No apparent anesthesia complications

## 2013-05-03 NOTE — Consult Note (Signed)
[facility] Consult for Management of ESRD/Hemodialysis/Anemia of CKD/ Metabolic Bone Disease  HPI: [name] is a 79 y.o. male ESRD patient (TTS HD) with dementia and multiple medical problems who is status post ligation of his right upper arm basilic vein transposition and placement of a diatek catheter 12/9-12/10 per Dr. Darrick Penna.  He presents to the ED today secondary to bleeding from his hemodialysis catheter at the outpatient center. He was sent to the ED from his outpatient center on 12/13 for a similar bleeding incident. The vascular surgeon was consulted and advised local management and replacement of the catheter if further issues arose.  An attempt was made today to initiate outpatient hemodialysis, but unfortunately he bled again.  Per reports from outpatient hemodialysis staff, there is some concern that there may be a hole in the device as bleeding is well controlled once he is disconnected.  His last full hemodialysis session was on 04/28/13. K+ is 3.9.  However pulmonary edema is noted on chest xray, likely a result of missed treatment and his ongoing issues with UF-limiting hypotension.  At the time of this encounter he is drowsy, close to his baseline and without complaints.  Past Medical History  Diagnosis Date  . Diabetes mellitus   . Hypertension   . Arthritis   . Cataract   . CAD (coronary artery disease)   . Secondary hyperthyroidism   . Dementia   . History of thrombocytopenia   . GERD (gastroesophageal reflux disease)   . Anxiety     paranoia  . Stroke   . Chronic kidney disease     Tue, Thurs, Sat dialysis, goes to [address]  . Anemia    Past Surgical History  Procedure Laterality Date  . Arteriovenous graft placement  05/01/10    Left upper arm brachial artery to axillary vein AVG  . Thrombectomy / arteriovenous graft revision  11/07/10    Left upper arm AVG  by Dr. Imogene Burn  . Diatek catheter insertion  12/05/10    Left side  . Bone spur       foot surgery  . Av fistula placement  04/14/2011    Procedure: ARTERIOVENOUS (AV) FISTULA CREATION;  Surgeon: Nilda Simmer, MD;  Location: [facility] OR;  Service: Vascular;  Laterality: Right;   Second  stage basilic vein transposition  . Knee surgery Left   . Foot surgery Left   . Bunionectomy    . Ligation of arteriovenous  fistula Right 04/26/2013    Procedure: LIGATION OF ARTERIOVENOUS  FISTULA;  Surgeon: Sherren Kerns, MD;  Location: [facility] OR;  Service: Vascular;  Laterality: Right;  . Insertion of dialysis catheter Left 04/26/2013    Procedure: INSERTION OF DIALYSIS CATHETER;  Surgeon: Sherren Kerns, MD;  Location: [facility] OR;  Service: Vascular;  Laterality: Left;   No family history on file. Social History:  reports that he has never smoked. He has never used smokeless tobacco. He reports that he drinks alcohol. He reports that he does not use illicit drugs. No Known Allergies Prior to Admission medications   Medication Sig Start Date End Date Taking? Authorizing Provider  acetaminophen (TYLENOL) 650 MG CR tablet Take 650 mg by mouth every 4 (four) hours as needed for pain or fever (and headache).   Yes Historical Provider, MD  Amino Acids-Protein Hydrolys (FEEDING SUPPLEMENT, PRO-STAT SUGAR FREE 64,) LIQD Take 30 mLs by mouth 2 (two) times daily.   Yes Historical Provider, MD  b complex-vitamin c-folic acid (  NEPHRO-VITE) 0.8 MG TABS tablet Take 1 tablet by mouth at bedtime.   Yes Historical Provider, MD  calcium acetate (PHOSLO) 667 MG capsule Take 667 mg by mouth 3 (three) times daily with meals.    Yes Historical Provider, MD  dextrose 5 G chewable tablet Chew 37.5 g by mouth as needed for low blood sugar. During dialysis PRN   Yes Historical Provider, MD  diphenhydrAMINE (BENADRYL) 25 MG tablet Take 25 mg by mouth as needed for itching.   Yes Historical Provider, MD  docusate sodium (COLACE) 100 MG capsule Take 200 mg by mouth at bedtime.    Yes Historical Provider, MD   escitalopram (LEXAPRO) 20 MG tablet Take 20 mg by mouth daily.   Yes Historical Provider, MD  loperamide (IMODIUM) 2 MG capsule Take 2 mg by mouth as needed for diarrhea or loose stools.   Yes Historical Provider, MD  LORazepam (ATIVAN) 0.5 MG tablet Take 0.5 mg by mouth every 4 (four) hours as needed for anxiety.   Yes Historical Provider, MD  midodrine (PROAMATINE) 10 MG tablet Take 10 mg by mouth 3 (three) times daily.   Yes Historical Provider, MD  Multiple Vitamins-Minerals (MULTIVITAMIN PO) Take 1 tablet by mouth daily.   Yes Historical Provider, MD  oxyCODONE (OXY IR/ROXICODONE) 5 MG immediate release tablet Take 5 mg by mouth 2 (two) times daily as needed for severe pain.   Yes Historical Provider, MD  risperiDONE (RISPERDAL) 0.25 MG tablet Take 0.25 mg by mouth at bedtime.    Yes Historical Provider, MD  simvastatin (ZOCOR) 20 MG tablet Take 20 mg by mouth at bedtime.    Yes Historical Provider, MD  Sucroferric Oxyhydroxide (VELPHORO) 500 MG CHEW Chew 1,000 mg by mouth 2 (two) times daily.    Yes Historical Provider, MD  tuberculin (APLISOL) 5 UNIT/0.1ML injection Inject 5 Units into the skin 2 (two) times a week. *wednesday and Friday*   Yes Historical Provider, MD  bisacodyl (DULCOLAX) 10 MG suppository Place 10 mg rectally daily as needed (for no bowel movement).     Historical Provider, MD  cinacalcet (SENSIPAR) 90 MG tablet Take 180 mg by mouth daily at 12 noon.     Historical Provider, MD  ethyl chloride spray Apply 1 application topically See admin instructions. Apply to dialysis site prior to dialysis (tuesday, Thursday, Saturday)    Historical Provider, MD  omeprazole (PRILOSEC) 20 MG capsule Take 20 mg by mouth daily.    Historical Provider, MD   Current Facility-Administered Medications  Medication Dose Route Frequency Provider Last Rate Last Dose  . 0.9 %  sodium chloride infusion   Intravenous Continuous Brayton Caves, MD 10 mL/hr at 05/03/13 1546    . calcium acetate  (PHOSLO) capsule 667 mg  667 mg Oral TID WC Kerin Salen, PA-C      . cefUROXime (ZINACEF) 1.5 g in dextrose 5 % 50 mL IVPB  1.5 g Intravenous Q8H Sherren Kerns, MD      . Melene Muller ON 05/04/2013] cinacalcet (SENSIPAR) tablet 90 mg  90 mg Oral Q lunch Kerin Salen, PA-C      . darbepoetin [facility]) injection 100 mcg  100 mcg Intravenous Q Tue-HD Kerin Salen, PA-C      . dextrose 5 % with cefUROXime (ZINACEF) ADS Med           . midodrine (PROAMATINE) tablet 10 mg  10 mg Oral TID WC Kerin Salen, PA-C      . multivitamin (  RENA-VIT) tablet 1 tablet  1 tablet Oral QHS Kerin Salen, PA-C       Labs: Basic Metabolic Panel:  Recent Labs Lab 04/27/13 0650 04/30/13 1007 05/03/13 0917 05/03/13 1044  NA 140 141 140 141  K 3.8 3.9 4.3 3.9  CL 100 99 99 100  CO2 28 30 27   --   GLUCOSE 77 109* 90 90  BUN 20 32* 57* 58*  CREATININE 4.48* 5.88* 9.78* 9.20*  CALCIUM 8.0* 10.2 8.8  --    Liver Function Tests:  Recent Labs Lab 04/27/13 0650 04/30/13 1007  AST 11 269*  ALT 8 255*  ALKPHOS 87 131*  BILITOT 0.4 0.5  PROT 5.5* 6.6  ALBUMIN 2.8* 3.1*    CBC:  Recent Labs Lab 04/26/13 1833  04/27/13 0945 04/30/13 1007 05/03/13 0917 05/03/13 1044  WBC 8.3  --  7.9 11.4* 6.9  --   NEUTROABS 5.1  --   --  8.7* 4.6  --   HGB 10.2*  < > 8.3* 9.8* 8.3* 9.9*  HCT 31.7*  < > 26.1* 30.5* 25.3* 29.0*  MCV 92.4  --  94.6 92.7 91.7  --   PLT 136*  --  92* 103* 96*  --   < > = values in this interval not displayed.  CBG:  Recent Labs Lab 04/27/13 0034 04/27/13 0729 04/27/13 1129 04/27/13 1704 05/03/13 1605  GLUCAP 108* 77 120* 92 75   Studies/Results: Dg Chest Portable 1 View  05/03/2013   CLINICAL DATA:  79 year old male with shortness of breath. Recently missed dialysis. Initial encounter.  EXAM: PORTABLE CHEST - 1 VIEW  COMPARISON:  04/30/2013 and earlier.  FINDINGS: Portable AP semi upright view at 0937 hrs. Stable left chest tunneled dual lumen dialysis catheter.  Stable cardiomegaly and mediastinal contours. Indistinct perihilar and basilar pulmonary vasculature. Continued confluent retrocardiac opacity. Evidence of small effusions, including fluid in the right minor fissure. No pneumothorax.  IMPRESSION: Basilar predominant pulmonary edema with small pleural effusions and continued confluent retrocardiac opacity - favor atelectasis.   Electronically Signed   By: Augusto Gamble M.D.   On: 05/03/2013 10:01    ROS: Unable to complete  - pt has dementia   Physical Exam: Filed Vitals:   05/03/13 1330 05/03/13 1400 05/03/13 1430 05/03/13 1500  BP: 120/77 105/63 115/62 104/79  Pulse: 77 79 75 82  Temp:      TempSrc:      Resp: 18 24 17 19   SpO2: 100% 99% 100% 99%     General: Somnolent, chronically -ill appearing. NAD Head: Normocephalic, atraumatic, mucus membranes are moist Neck: Supple. JVD not elevated. Lungs: Coarse breath sounds, decreased at bases, No wheezes, rales, or rhonchi. Breathing is unlabored. Heart: RRR with S1 S2. No murmurs, rubs, or gallops appreciated. Abdomen: Soft, non-tender, non-distended with normoactive bowel sounds. No rebound/guarding. No obvious abdominal masses. M-S:  Strength and tone appear normal for age. Lower extremities: +pretibial edema. Compression hose in place.  Neuro: Unable to evaluate, drowsy Moves all extremities spontaneously. Psych:  Exam limited 2/2 patient dememtia Dialysis Access: L TDC with dressing, oozing blood. R  BVT wrapped s/p ligation on 12/9.  Dialysis Orders: Center: [facility] on TTS. EDW 86kg HD Bath 3K/2.5Ca Time 4:00 Heparin 2600. Access L TDC placed 12/9 BFR 400 DFR 800    Hectorol 4 mcg IV/HD Epogen 8600 Units IV/HD  Venofer  0  Recent labs: Hgb 8.5 down from 11.5 on 12/4. Tsat 65%, P 5.1, PTH 971  Assessment/Plan:  1. Bleeding from Diatek catheter - s/p ligation of R BVT and placement of L TDC on 12/9 by Dr. Darrick Penna. Has had 1 successful outpatient dialysis. Unable to initiate tx op on 12/13 and  12/16 due to bleeding profuse bleeding which slows to an ooze once he is taken off HD. (possible hole?) VVS consulted, catheter replaced in OR today 2. ESRD -  TTS. Last HD 12/11 secondary to #1 above. K+ 3.9. HD after catheter replacement. 3. Hypertension/volume  - SBPs 110's. Hypotension at op HD limiting UF. On midodrine TID at home. Has been losing wgt. Pulm edema and effusions on CXR. UF 2-3L as tolerated. Repeat CXR in the am. Lower edw at d/c. 4. Anemia  - Hgb 9.9 on op Epo. Aranesp 100 ordered. Last Tsat 65%. No Fe for now. Follow CBC. 5. Metabolic bone disease -  Ca 8.8, Last P 5.1. ^PTH op. Continue Vit D, Sensipar. Lajoyce Corners is velphoro which is not currently on [facility] formulary. Will order Phoslo for here. 6. Nutrition - NPO pending catheter swap. Then renal diet, multivitamin, ONS  Claud Kelp, PA-C [facility] Pager [phone] 05/03/2013, 4:18 PM

## 2013-05-03 NOTE — Progress Notes (Signed)
attempted to contact pt son at his work and home number without success.  Not able to leave a message at either number.  Dr Darrick Penna aware.

## 2013-05-03 NOTE — Progress Notes (Signed)
Unable to complete admission hx at this time. Pt  Is arousable  and not able to answer questions.

## 2013-05-03 NOTE — Interval H&P Note (Signed)
History and Physical Interval Note:  [facility] SHORT STAY H&P  HPI: Bleeding dialysis catheter  Past Medical History  Diagnosis Date  . Diabetes mellitus   . Hypertension   . Arthritis   . Cataract   . CAD (coronary artery disease)   . Secondary hyperthyroidism   . Dementia   . History of thrombocytopenia   . GERD (gastroesophageal reflux disease)   . Anxiety     paranoia  . Stroke   . Chronic kidney disease     Tue, Thurs, Sat dialysis, goes to [address]  . Anemia     FH:  Non-Contributory  Social HX History  Substance Use Topics  . Smoking status: Never Smoker   . Smokeless tobacco: Never Used  . Alcohol Use: Yes     Comment: approx 30 years ago    Allergies No Known Allergies  Medications Current Facility-Administered Medications  Medication Dose Route Frequency Provider Last Rate Last Dose  . 0.9 %  sodium chloride infusion   Intravenous Continuous Brayton Caves, MD 10 mL/hr at 05/03/13 1546     CBC    Component Value Date/Time   WBC 6.9 05/03/2013 0917   RBC 2.76* 05/03/2013 0917   HGB 9.9* 05/03/2013 1044   HCT 29.0* 05/03/2013 1044   PLT 96* 05/03/2013 0917   MCV 91.7 05/03/2013 0917   MCH 30.1 05/03/2013 0917   MCHC 32.8 05/03/2013 0917   RDW 17.2* 05/03/2013 0917   LYMPHSABS 1.3 05/03/2013 0917   MONOABS 0.9 05/03/2013 0917   EOSABS 0.1 05/03/2013 0917   BASOSABS 0.0 05/03/2013 0917    BMET    Component Value Date/Time   NA 141 05/03/2013 1044   K 3.9 05/03/2013 1044   CL 100 05/03/2013 1044   CO2 27 05/03/2013 0917   GLUCOSE 90 05/03/2013 1044   BUN 58* 05/03/2013 1044   CREATININE 9.20* 05/03/2013 1044   CALCIUM 8.8 05/03/2013 0917   GFRNONAA 4* 05/03/2013 0917   GFRAA 5* 05/03/2013 0917      PHYSICAL EXAM  Filed Vitals:   05/03/13 1500  BP: 104/79  Pulse: 82  Temp:   Resp: 19    General:  WDWN in NAD HENT: WNL Neck: left side dialysis catheter with some fresh blood at skin exit site Eyes:  Pupils equal Pulmonary: normal non-labored breathing Cardiac: RRR, Neuro confused responds only with no  Impression: Bleeding dialysis catheter  Plan: Exchange catheter  Attempted to contact family no answer by phone Ayleah Hofmeister E @TODAY @ 3:50 PM     05/03/2013 3:49 PM  Lasandra Beech  has presented today for surgery, with the diagnosis of Bleeding  The various methods of treatment have been discussed with the patient and family. After consideration of risks, benefits and other options for treatment, the patient has consented to  Procedure(s): EXCHANGE OF A DIALYSIS CATHETER (N/A) as a surgical intervention .  The patient's history has been reviewed, patient examined, no change in status, stable for surgery.  I have reviewed the patient's chart and labs.  Questions were answered to the patient's satisfaction.     Turon Kilmer E

## 2013-05-03 NOTE — H&P (Signed)
[facility] History and Physical  [name] WUJ:[id number] DOB: 1931/09/24 DOA: 05/03/2013  Referring physician: Lars Mage PCP: Bufford Spikes, DO   Chief Complaint: reported bleeding from dialysis catheter  HPI: [name] is a 79 y.o. male who was sent the ED with vascular access problems: bleeding around diatek and poor functionality.  Reportedly, has had problems with ultrafiltration at most recent HD sessions.  Patient has been seen by vascular and nephrology.  He is evaluated in PACU and sedated postop after diatek exchange.  Also, reportedly has dementia. All history per EDP and review of notes.  CXR shows pulmonary edema and nephrology recommends admission for revision of access and dialysis for fluid overload.  Hgb 8.8.  Potassium ok.   Review of Systems:  Unable due to patient factors.  Past Medical History  Diagnosis Date  . Diabetes mellitus   . Hypertension   . Arthritis   . Cataract   . CAD (coronary artery disease)   . Secondary hyperthyroidism   . Dementia   . History of thrombocytopenia   . GERD (gastroesophageal reflux disease)   . Anxiety     paranoia  . Stroke   . Chronic kidney disease     Tue, Thurs, Sat dialysis, goes to [address]  . Anemia    Past Surgical History  Procedure Laterality Date  . Arteriovenous graft placement  05/01/10    Left upper arm brachial artery to axillary vein AVG  . Thrombectomy / arteriovenous graft revision  11/07/10    Left upper arm AVG  by Dr. Imogene Burn  . Diatek catheter insertion  12/05/10    Left side  . Bone spur      foot surgery  . Av fistula placement  04/14/2011    Procedure: ARTERIOVENOUS (AV) FISTULA CREATION;  Surgeon: Nilda Simmer, MD;  Location: [facility] OR;  Service: Vascular;  Laterality: Right;   Second  stage basilic vein transposition  . Knee surgery Left   . Foot surgery Left   . Bunionectomy    . Ligation of arteriovenous  fistula Right 04/26/2013    Procedure: LIGATION OF  ARTERIOVENOUS  FISTULA;  Surgeon: Sherren Kerns, MD;  Location: [facility] OR;  Service: Vascular;  Laterality: Right;  . Insertion of dialysis catheter Left 04/26/2013    Procedure: INSERTION OF DIALYSIS CATHETER;  Surgeon: Sherren Kerns, MD;  Location: [facility] OR;  Service: Vascular;  Laterality: Left;   Social History:  reports that he has never smoked. He has never used smokeless tobacco. He reports that he drinks alcohol. He reports that he does not use illicit drugs.  No Known Allergies  FH: unable due to patient factors   Prior to Admission medications   Medication Sig Start Date End Date Taking? Authorizing Provider  acetaminophen (TYLENOL) 650 MG CR tablet Take 650 mg by mouth every 4 (four) hours as needed for pain or fever (and headache).   Yes Historical Provider, MD  Amino Acids-Protein Hydrolys (FEEDING SUPPLEMENT, PRO-STAT SUGAR FREE 64,) LIQD Take 30 mLs by mouth 2 (two) times daily.   Yes Historical Provider, MD  b complex-vitamin c-folic acid (NEPHRO-VITE) 0.8 MG TABS tablet Take 1 tablet by mouth at bedtime.   Yes Historical Provider, MD  calcium acetate (PHOSLO) 667 MG capsule Take 667 mg by mouth 3 (three) times daily with meals.    Yes Historical Provider, MD  dextrose 5 G chewable tablet Chew 37.5 g by mouth as needed for low blood sugar. During  dialysis PRN   Yes Historical Provider, MD  diphenhydrAMINE (BENADRYL) 25 MG tablet Take 25 mg by mouth as needed for itching.   Yes Historical Provider, MD  docusate sodium (COLACE) 100 MG capsule Take 200 mg by mouth at bedtime.    Yes Historical Provider, MD  escitalopram (LEXAPRO) 20 MG tablet Take 20 mg by mouth daily.   Yes Historical Provider, MD  loperamide (IMODIUM) 2 MG capsule Take 2 mg by mouth as needed for diarrhea or loose stools.   Yes Historical Provider, MD  LORazepam (ATIVAN) 0.5 MG tablet Take 0.5 mg by mouth every 4 (four) hours as needed for anxiety.   Yes Historical Provider, MD  midodrine (PROAMATINE) 10 MG tablet  Take 10 mg by mouth 3 (three) times daily.   Yes Historical Provider, MD  Multiple Vitamins-Minerals (MULTIVITAMIN PO) Take 1 tablet by mouth daily.   Yes Historical Provider, MD  oxyCODONE (OXY IR/ROXICODONE) 5 MG immediate release tablet Take 5 mg by mouth 2 (two) times daily as needed for severe pain.   Yes Historical Provider, MD  risperiDONE (RISPERDAL) 0.25 MG tablet Take 0.25 mg by mouth at bedtime.    Yes Historical Provider, MD  simvastatin (ZOCOR) 20 MG tablet Take 20 mg by mouth at bedtime.    Yes Historical Provider, MD  Sucroferric Oxyhydroxide (VELPHORO) 500 MG CHEW Chew 1,000 mg by mouth 2 (two) times daily.    Yes Historical Provider, MD  tuberculin (APLISOL) 5 UNIT/0.1ML injection Inject 5 Units into the skin 2 (two) times a week. *wednesday and Friday*   Yes Historical Provider, MD  bisacodyl (DULCOLAX) 10 MG suppository Place 10 mg rectally daily as needed (for no bowel movement).     Historical Provider, MD  cinacalcet (SENSIPAR) 90 MG tablet Take 180 mg by mouth daily at 12 noon.     Historical Provider, MD  ethyl chloride spray Apply 1 application topically See admin instructions. Apply to dialysis site prior to dialysis (tuesday, Thursday, Saturday)    Historical Provider, MD  omeprazole (PRILOSEC) 20 MG capsule Take 20 mg by mouth daily.    Historical Provider, MD   Physical Exam: Filed Vitals:   05/03/13 1500  BP: 104/79  Pulse: 82  Temp:   Resp: 19    BP 104/79  Pulse 82  Temp(Src) 98 F (36.7 C) (Oral)  Resp 19  SpO2 99%  BP 105/69  Pulse 73  Temp(Src) 98.4 F (36.9 C) (Oral)  Resp 20  SpO2 99%  General Appearance:    Groggy. Briefly arousable  Head:    Normocephalic, without obvious abnormality, atraumatic  Eyes:    PERRL, conjunctiva/corneas clear, EOM's intact, fundi    benign, both eyes          Nose:   Nares normal, septum midline, mucosa normal, no drainage   or sinus tenderness  Throat:   Lips, mucosa, and tongue normal; teeth and gums  normal  Neck:   Supple, no LA, thyromegaly  Back:     Symmetric, no curvature, ROM normal, no CVA tenderness  Lungs:     Clear to auscultation bilaterally, respirations unlabored  Chest wall:    Left chest with bandage and diatek  Heart:    Regular rate and rhythm, S1 and S2 normal, no murmur, rub   or gallop  Abdomen:     Soft, non-tender, bowel sounds active all four quadrants,    no masses, no organomegaly  Genitalia:   deferred  Rectal:   deferred  Extremities:   Extremities normal, atraumatic, no cyanosis or edema  Pulses:   2+ and symmetric all extremities  Skin:   Skin color, texture, turgor normal, no rashes or lesions  Lymph nodes:   Cervical, supraclavicular, and axillary nodes normal  Neurologic:   No obvious CN or motor defecits             Labs on Admission:  Basic Metabolic Panel:  Recent Labs Lab 04/26/13 1917 04/26/13 2316 04/27/13 0650 04/30/13 1007 05/03/13 0917 05/03/13 1044  NA 140 141 140 141 140 141  K 3.4* 2.6* 3.8 3.9 4.3 3.9  CL 96  --  100 99 99 100  CO2  --   --  28 30 27   --   GLUCOSE 83 130* 77 109* 90 90  BUN 25*  --  20 32* 57* 58*  CREATININE 4.10*  --  4.48* 5.88* 9.78* 9.20*  CALCIUM  --   --  8.0* 10.2 8.8  --    Liver Function Tests:  Recent Labs Lab 04/27/13 0650 04/30/13 1007  AST 11 269*  ALT 8 255*  ALKPHOS 87 131*  BILITOT 0.4 0.5  PROT 5.5* 6.6  ALBUMIN 2.8* 3.1*   No results found for this basename: LIPASE, AMYLASE,  in the last 168 hours No results found for this basename: AMMONIA,  in the last 168 hours CBC:  Recent Labs Lab 04/26/13 1833  04/26/13 2316 04/27/13 0945 04/30/13 1007 05/03/13 0917 05/03/13 1044  WBC 8.3  --   --  7.9 11.4* 6.9  --   NEUTROABS 5.1  --   --   --  8.7* 4.6  --   HGB 10.2*  < > 9.9* 8.3* 9.8* 8.3* 9.9*  HCT 31.7*  < > 29.0* 26.1* 30.5* 25.3* 29.0*  MCV 92.4  --   --  94.6 92.7 91.7  --   PLT 136*  --   --  92* 103* 96*  --   < > = values in this interval not  displayed. Cardiac Enzymes: No results found for this basename: CKTOTAL, CKMB, CKMBINDEX, TROPONINI,  in the last 168 hours  BNP (last 3 results) No results found for this basename: PROBNP,  in the last 8760 hours CBG:  Recent Labs Lab 04/27/13 0034 04/27/13 0729 04/27/13 1129 04/27/13 1704 05/03/13 1605  GLUCAP 108* 77 120* 92 75    Radiological Exams on Admission: Dg Chest Portable 1 View  05/03/2013   CLINICAL DATA:  79 year old male with shortness of breath. Recently missed dialysis. Initial encounter.  EXAM: PORTABLE CHEST - 1 VIEW  COMPARISON:  04/30/2013 and earlier.  FINDINGS: Portable AP semi upright view at 0937 hrs. Stable left chest tunneled dual lumen dialysis catheter. Stable cardiomegaly and mediastinal contours. Indistinct perihilar and basilar pulmonary vasculature. Continued confluent retrocardiac opacity. Evidence of small effusions, including fluid in the right minor fissure. No pneumothorax.  IMPRESSION: Basilar predominant pulmonary edema with small pleural effusions and continued confluent retrocardiac opacity - favor atelectasis.   Electronically Signed   By: Augusto Gamble M.D.   On: 05/03/2013 10:01   Assessment/Plan  Poorly functioning diatek with reports of bleeding around the site:  Post exchange. To renal floor   Pulmonary edema: dialysis per nephrology   HYPERLIPIDEMIA-MIXED   End stage renal disease on dialysis   Depression   GERD (gastroesophageal reflux disease)   Dementia   Anemia, chronic. monitor   Thrombocytopenia, unspecified, chronic   Code Status: presumed full.  Family Communication: *none  available Disposition Plan:  Back to SNF  Time spent: 50 min  Teven Mittman L [facility] Pager [phone]

## 2013-05-03 NOTE — ED Notes (Signed)
Attempted to gain IV access, unsuccessful. Paged IV team. Phlebotomy at bedside to attempt to draw bld.

## 2013-05-04 ENCOUNTER — Inpatient Hospital Stay (HOSPITAL_COMMUNITY): Payer: Medicare Other

## 2013-05-04 DIAGNOSIS — T82598A Other mechanical complication of other cardiac and vascular devices and implants, initial encounter: Secondary | ICD-10-CM

## 2013-05-04 DIAGNOSIS — Z5189 Encounter for other specified aftercare: Secondary | ICD-10-CM

## 2013-05-04 LAB — BASIC METABOLIC PANEL
BUN: 24 mg/dL — ABNORMAL HIGH (ref 6–23)
CO2: 29 mEq/L (ref 19–32)
Calcium: 8.1 mg/dL — ABNORMAL LOW (ref 8.4–10.5)
Creatinine, Ser: 5.5 mg/dL — ABNORMAL HIGH (ref 0.50–1.35)
GFR calc non Af Amer: 9 mL/min — ABNORMAL LOW (ref >90)
Glucose, Bld: 72 mg/dL (ref 70–99)
Potassium: 4.1 mEq/L (ref 3.5–5.1)
Sodium: 140 mEq/L (ref 135–145)

## 2013-05-04 LAB — GLUCOSE, CAPILLARY: Glucose-Capillary: 71 mg/dL (ref 70–99)

## 2013-05-04 LAB — CBC
HCT: 24.7 % — ABNORMAL LOW (ref 39.0–52.0)
Hemoglobin: 8.1 g/dL — ABNORMAL LOW (ref 13.0–17.0)
MCH: 30.6 pg (ref 26.0–34.0)
MCHC: 32.8 g/dL (ref 30.0–36.0)
MCV: 93.2 fL (ref 78.0–100.0)
Platelets: 83 10*3/uL — ABNORMAL LOW (ref 150–400)
RBC: 2.65 MIL/uL — ABNORMAL LOW (ref 4.22–5.81)

## 2013-05-04 MED ORDER — DEXTROSE 50 % IV SOLN: 25.0000 mL | Freq: Once | INTRAVENOUS | Status: AC | PRN

## 2013-05-04 MED ORDER — DEXTROSE 50 % IV SOLN
INTRAVENOUS | Status: AC
  Administered 2013-05-04: 25 mL
  Filled 2013-05-04: qty 50

## 2013-05-04 MED ORDER — DARBEPOETIN ALFA-POLYSORBATE 100 MCG/0.5ML IJ SOLN
INTRAMUSCULAR | Status: AC
  Administered 2013-05-04: 100 ug via INTRAVENOUS
  Filled 2013-05-04: qty 0.5

## 2013-05-04 MED ORDER — LANTHANUM CARBONATE 500 MG PO CHEW
1000.0000 mg | CHEWABLE_TABLET | Freq: Two times a day (BID) | ORAL | Status: DC
  Administered 2013-05-05: 1000 mg via ORAL
  Filled 2013-05-04 (×5): qty 2

## 2013-05-04 NOTE — Progress Notes (Signed)
[facility]                                                                                Patient Demographics  [name], is a 79 y.o. male, DOB - 1931-08-15, ZOX:[id number]  Admit date - 05/03/2013   Admitting Physician No admitting provider for patient encounter.  Outpatient Primary MD for the patient is REED, TIFFANY, DO  LOS - 1   Chief Complaint  Patient presents with  . Vascular Access Problem       Assessment & Plan    Active Problems:   HYPERLIPIDEMIA-MIXED   End stage renal disease on dialysis   Depression   GERD (gastroesophageal reflux disease)   Dementia   Anemia   Thrombocytopenia, unspecified   Pulmonary edema  Pulmonary edema -Noted on chest x-ray. Patient's last for hemodialysis it was noted to be on 04/28/2013. -Currently being treated with dialysis and followed by nephrology.  Bleeding from Diatek Catheter -Patient underwent catheter exchange by Dr. Darrick Penna on 05/03/2013  ESRD requiring hemodialysis -Nephrology consulted and following -Patient is a Tuesday Thursday Saturday dialysis patient -Continue PhosLo, Sensipar, Aranesp, multivitamin  Hypertension -Currently stable.  Anemia secondary to chronic kidney disease -Currently stable, continue epo, Aranesp -Will continue to monitor CBC  GERD -Continue Protonix  Thrombocytopenia, chronic  Depression -Continue Lexapro and risperidone  Dementia -Stable  Hyperlipidemia Continue Zocor  Code Status: Full  Family Communication: None at this time.  Disposition Plan: Admitted.  Will likely discharge to SNF once stable.   Procedures  Diatek catheter exchange by Dr. Darrick Penna, 05/03/2013  Consults   Vascular surgery, Dr. Darrick Penna Nephrology  DVT Prophylaxis  SCDs   Lab Results  Component Value Date   PLT 96* 05/03/2013    Medications  Scheduled Meds: . calcium acetate  667 mg Oral TID WC  . cinacalcet  180 mg Oral Q lunch  . darbepoetin (ARANESP) injection -  DIALYSIS  100 mcg Intravenous Q Tue-HD  . docusate sodium  200 mg Oral QHS  . escitalopram  20 mg Oral Daily  . feeding supplement (PRO-STAT SUGAR FREE 64)  30 mL Oral BID  . midodrine  10 mg Oral TID WC  . multivitamin  1 tablet Oral QHS  . pantoprazole  40 mg Oral Daily  . risperiDONE  0.25 mg Oral QHS  . simvastatin  20 mg Oral QHS  . Sucroferric Oxyhydroxide  1,000 mg Oral BID   Continuous Infusions:  PRN Meds:.acetaminophen, bisacodyl, dextrose, LORazepam, morphine injection, ondansetron (ZOFRAN) IV, ondansetron  Antibiotics   Anti-infectives   Start     Dose/Rate Route Frequency Ordered Stop   05/03/13 1615  cefUROXime (ZINACEF) 1.5 g in dextrose 5 % 50 mL IVPB  Status:  Discontinued     1.5 g 100 mL/hr over 30 Minutes Intravenous 3 times per day 05/03/13 1604 05/03/13 1842   05/03/13 1555  dextrose 5 % with cefUROXime (ZINACEF) ADS Med    Comments:  Merril Abbe   : cabinet override      05/03/13 1555 05/04/13 0359      Time Spent in minutes   30 minutes  Nazaret Chea D.O. on 05/04/2013 at 9:29 AM  Between  7am to 7pm - Pager - [phone]  After 7pm go to [email or web address] - password [email or web address]  And look for the night coverage person covering for me after hours  [facility] Office  [phone]    Subjective:   [name] seen and examined today.  Patient had no complaints today.  Was not very verbal, however, did respond to yes/no questions. Patient denies dizziness, chest pain, shortness of breath, abdominal pain, N/V/D/C, new weakness, numbess, tingling.    Objective:   Filed Vitals:   05/04/13 0130 05/04/13 0230 05/04/13 0235 05/04/13 0320  BP: 90/55 96/50 92/54  112/65  Pulse: 71 17 14 74  Temp:  98.3 F (36.8 C)  98.4 F (36.9 C)  TempSrc:  Oral  Oral  Resp: 16 15 16 17   Height:      Weight:  83.7 kg (184 lb 8.4 oz)    SpO2: 100% 100%  98%    Wt Readings from Last 3 Encounters:  05/04/13 83.7 kg (184 lb 8.4 oz)  05/04/13 83.7 kg  (184 lb 8.4 oz)  04/29/13 86.183 kg (190 lb)     Intake/Output Summary (Last 24 hours) at 05/04/13 2956 Last data filed at 05/04/13 0230  Gross per 24 hour  Intake    325 ml  Output   3025 ml  Net  -2700 ml    Exam  General: Well developed, well nourished, NAD, appears stated age  HEENT: NCAT, PERRLA, EOMI, Anicteic Sclera, mucous membranes moist. No pharyngeal erythema or exudates  Neck: Supple, no JVD, no masses  Cardiovascular: S1 S2 auscultated, 2/6 SEM, Regular rate and rhythm.  Respiratory: Coarse breath sounds in all lung fields. Breathing is unlabored  Abdomen: Soft, nontender, nondistended, + bowel sounds  Extremities: warm dry without cyanosis clubbing.  Trace edema in lower extremities bilaterally. Right upper extremity and wrapped. Dressing noted on left.  Neuro: Awake and alert, cranial nerves grossly intact.   Skin: Without rashes exudates or nodules  Data Review   Micro Results Recent Results (from the past 240 hour(s))  MRSA PCR SCREENING     Status: None   Collection Time    04/27/13 12:57 AM      Result Value Range Status   MRSA by PCR NEGATIVE  NEGATIVE Final   Comment:            The GeneXpert MRSA Assay (FDA     approved for NASAL specimens     only), is one component of a     comprehensive MRSA colonization     surveillance program. It is not     intended to diagnose MRSA     infection nor to guide or     monitor treatment for     MRSA infections.    Radiology Reports Dg Chest 2 View  04/30/2013   CLINICAL DATA:  Bleeding from the left-sided hemodialysis catheter.  EXAM: CHEST  2 VIEW  COMPARISON:  04/26/2013  FINDINGS: Cardiac silhouette is enlarged but stable. There are small right and moderate left pleural effusions. Central vascular congestion is noted, but there is no overt pulmonary edema. Dependent lower lobe atelectasis is noted adjacent to the pleural effusions.  Left internal jugular tunneled dual lumen central venous catheter  is stable. No pneumothorax.  IMPRESSION: 1. Tunneled dual lumen left internal jugular central venous catheter is stable and well positioned. 2. Central vascular congestion is noted, but the pulmonary edema noted previously has resolved. 3. Moderate left and small right pleural effusion with associated  dependent atelectasis. This is stable. Stable cardiomegaly.   Electronically Signed   By: Amie Portland M.D.   On: 04/30/2013 10:23   US Abdomen Complete  04/30/2013   CLINICAL DATA:  Abdominal pain.  Elevated LFTs  EXAM: ULTRASOUND ABDOMEN COMPLETE  COMPARISON:  None.  FINDINGS: Gallbladder:  Small bowel gallstones appreciated within the gallbladder. Gallbladder wall thickness 2.4 mm. There is no evidence of pericholecystic fluid nor a sonographic Murphy's sign.  Common bile duct:  Diameter: 4.2 mm  Liver:  No focal lesion identified. Within normal limits in parenchymal echogenicity. Hepatopetal flow is identified within the portal vein.  IVC:  No abnormality visualized.  Pancreas:  Visualized portion unremarkable.  Spleen:  Size and appearance within normal limits.  Right Kidney:  Length: 8.7 cm. Echogenicity within normal limits. No mass or hydronephrosis visualized. 2 x 2 x 1.8 cm cyst posterior midpole. There is decreased corticomedullary differentiation without evidence of hydronephrosis, calculi, or solid masses.  Left Kidney:  Length: The left kidney was not visualized secondary to overlying bowel gas.  Abdominal aorta:  No aneurysm visualized.  Other findings:  None.  IMPRESSION: 1. Cholelithiasis without evidence of cholecystitis 2. Benign appearing cyst right kidney 3. Renal findings consistent with the patient's history of chronic renal disease   Electronically Signed   By: Salome Holmes M.D.   On: 04/30/2013 15:09   Dg Chest Port 1 View  05/04/2013   CLINICAL DATA:  Congestion and edema.  EXAM: PORTABLE CHEST - 1 VIEW  COMPARISON:  05/03/2013  FINDINGS: Enlarged central vascular structures with  diffuse pulmonary edema. Heart size remains enlarged. Persistent opacification at the left lung base may represent atelectasis and cannot exclude pleural fluid. Left jugular dialysis catheter tip at the superior cavoatrial junction. Negative for a pneumothorax. Atherosclerotic calcifications in the thoracic aorta.  IMPRESSION: Minimal change in the pulmonary edema.  Left basilar densities as described.   Electronically Signed   By: Richarda Overlie M.D.   On: 05/04/2013 07:36   Dg Chest Port 1 View  05/03/2013   CLINICAL DATA:  Post diastatic catheter placement  EXAM: PORTABLE CHEST - 1 VIEW  COMPARISON:  Portable exam 1759 hr compared to 0937 hr  FINDINGS: New left jugular dual-lumen central venous catheter with tip projecting over superior right atrium.  No pneumothorax.  Enlargement of cardiac silhouette with pulmonary vascular congestion.  Persistent left lower lobe atelectasis versus consolidation.  Minimal pulmonary edema.  Diffuse osseous demineralization.  IMPRESSION: No pneumothorax following central line placement.  CHF with persistent atelectasis versus consolidation in left lower lobe.   Electronically Signed   By: Ulyses Southward M.D.   On: 05/03/2013 18:17   Dg Chest Portable 1 View  05/03/2013   CLINICAL DATA:  79 year old male with shortness of breath. Recently missed dialysis. Initial encounter.  EXAM: PORTABLE CHEST - 1 VIEW  COMPARISON:  04/30/2013 and earlier.  FINDINGS: Portable AP semi upright view at 0937 hrs. Stable left chest tunneled dual lumen dialysis catheter. Stable cardiomegaly and mediastinal contours. Indistinct perihilar and basilar pulmonary vasculature. Continued confluent retrocardiac opacity. Evidence of small effusions, including fluid in the right minor fissure. No pneumothorax.  IMPRESSION: Basilar predominant pulmonary edema with small pleural effusions and continued confluent retrocardiac opacity - favor atelectasis.   Electronically Signed   By: Augusto Gamble M.D.   On:  05/03/2013 10:01   Dg Chest Portable 1 View  04/26/2013   CLINICAL DATA:  Postoperative radiograph; left Diatek placement.  EXAM: PORTABLE CHEST -  1 VIEW  COMPARISON:  Chest radiograph performed 12/05/2010, and CT of the chest performed 03/16/2013  FINDINGS: The patient's left-sided dual-lumen catheter is noted ending about the distal SVC.  There is an increased moderate left-sided pleural effusion, and a small right pleural effusion. Associated bilateral airspace opacification may reflect pulmonary edema or pneumonia. No pneumothorax is seen, though the lung apices are obscured by the patient's head.  The cardiomediastinal silhouette is borderline enlarged. No acute osseous abnormalities are identified.  IMPRESSION: 1. Left-sided dual-lumen catheter noted ending about the distal SVC. 2. Increased moderate left-sided pleural effusion, and small right pleural effusion. Associated diffuse bilateral airspace opacification may reflect pulmonary edema or pneumonia. 3. Borderline cardiomegaly.   Electronically Signed   By: Roanna Raider M.D.   On: 04/26/2013 22:47   Dg Fluoro Guide Cv Line-no Report  05/03/2013   CLINICAL DATA: diatek placement   FLOURO GUIDE CV LINE  Fluoroscopy was utilized by the requesting physician.  No radiographic  interpretation.    Dg Fluoro Guide Cv Line-no Report  04/26/2013   CLINICAL DATA: Bleeding Arteriovenous Fistula   FLOURO GUIDE CV LINE  Fluoroscopy was utilized by the requesting physician.  No radiographic  interpretation.     CBC  Recent Labs Lab 04/27/13 0945 04/30/13 1007 05/03/13 0917 05/03/13 1044  WBC 7.9 11.4* 6.9  --   HGB 8.3* 9.8* 8.3* 9.9*  HCT 26.1* 30.5* 25.3* 29.0*  PLT 92* 103* 96*  --   MCV 94.6 92.7 91.7  --   MCH 30.1 29.8 30.1  --   MCHC 31.8 32.1 32.8  --   RDW 16.9* 16.7* 17.2*  --   LYMPHSABS  --  1.6 1.3  --   MONOABS  --  1.0 0.9  --   EOSABS  --  0.1 0.1  --   BASOSABS  --  0.0 0.0  --     Chemistries   Recent Labs Lab  04/30/13 1007 05/03/13 0917 05/03/13 1044  NA 141 140 141  K 3.9 4.3 3.9  CL 99 99 100  CO2 30 27  --   GLUCOSE 109* 90 90  BUN 32* 57* 58*  CREATININE 5.88* 9.78* 9.20*  CALCIUM 10.2 8.8  --   AST 269*  --   --   ALT 255*  --   --   ALKPHOS 131*  --   --   BILITOT 0.5  --   --    ------------------------------------------------------------------------------------------------------------------ estimated creatinine clearance is 6.6 ml/min (by C-G formula based on Cr of 9.2). ------------------------------------------------------------------------------------------------------------------ No results found for this basename: HGBA1C,  in the last 72 hours ------------------------------------------------------------------------------------------------------------------ No results found for this basename: CHOL, HDL, LDLCALC, TRIG, CHOLHDL, LDLDIRECT,  in the last 72 hours ------------------------------------------------------------------------------------------------------------------ No results found for this basename: TSH, T4TOTAL, FREET3, T3FREE, THYROIDAB,  in the last 72 hours ------------------------------------------------------------------------------------------------------------------ No results found for this basename: VITAMINB12, FOLATE, FERRITIN, TIBC, IRON, RETICCTPCT,  in the last 72 hours  Coagulation profile  Recent Labs Lab 05/03/13 1005  INR 1.28    No results found for this basename: DDIMER,  in the last 72 hours  Cardiac Enzymes No results found for this basename: CK, CKMB, TROPONINI, MYOGLOBIN,  in the last 168 hours ------------------------------------------------------------------------------------------------------------------ No components found with this basename: POCBNP,

## 2013-05-04 NOTE — Clinical Social Work Psychosocial (Signed)
Clinical Social Work Department BRIEF PSYCHOSOCIAL ASSESSMENT 05/04/2013  Patient:  [name], [name]     Account Number:  [id number]     Admit date:  05/03/2013  Clinical Social Worker:  Delmer Islam  Date/Time:  05/04/2013 12:27 PM  Referred by:  Physician  Date Referred:  05/04/2013 Referred for  SNF Placement   Other Referral:   Interview type:  Other - See comment Other interview type:   Bonita Quin, admissions director at [employer]    PSYCHOSOCIAL DATA Living Status:  FACILITY Admitted from facility:  [facility], [place] Level of care:  Skilled Nursing Facility Primary support name:  [name] Primary support relationship to patient:  CHILD, ADULT Degree of support available:   Son's phone number (h) [phone]    CURRENT CONCERNS Current Concerns  Post-Acute Placement   Other Concerns:    SOCIAL WORK ASSESSMENT / PLAN Patient is a long-term care resident at [facility]. Admissions director from facility visited with patient this morning. CSW attempted to talk with patient, however he was asleep - will return to room later in the day to confirm return to facility.   Assessment/plan status:  Psychosocial Support/Ongoing Assessment of Needs Other assessment/ plan:   Information/referral to community resources:    PATIENT'S/FAMILY'S RESPONSE TO PLAN OF CARE: Patient asleep when CSW visited. Will return to room later in the day.

## 2013-05-04 NOTE — Progress Notes (Signed)
Utilization review completed.  

## 2013-05-04 NOTE — Progress Notes (Signed)
  [name] Progress Note    Subjective: No more SOB, ~3kg off with HD yesterday   Exam  Blood pressure 112/65, pulse 74, temperature 98.4 F (36.9 C), temperature source Oral, resp. rate 17, height 5\' 6"  (1.676 m), weight 83.7 kg (184 lb 8.4 oz), SpO2 98.00%.  gen: alert, no distress  neck: no jvd  chest: dec'd L base, otherwise clear  cor: reg, 2/6 SEM no rub or gallop  abd: firm, nd, nt, +bs  ext: bandage RUA, L IJ TDC no blood on dressing, 1+ ankle edema L > R  neuro: follows simple commands, not sure where he is  Dialysis: [facility] TTS 4hrs   3K/2.5Ca  400/800   86kg    Heparin 2600   L IJ TDC (12/16 replaced) Hectorol 4    EPO  8600    Venofer none Recent lab: Hb down 8.5 from 11.5 on 12/4, tsat 65% phos 5.1 pth 971  Assessment/Plan 1. Bleeding HD cath: replaced yest by [facility], worked well last night, no further bleeding; most likely had a crack 2. Non-cardiac pulm edema: better, further vol removal w HD tomorrow, lower dry wt 3. Chronic hypotension: takes midodrine 10 tid , cont 4. ESRD: HD Thursday 5. Anemia: Hb 9.9, cont esa w darbe 100/wk 6. MBD: cont vitd, sensipar, phoslo (in place of velphoro, not on formulary() 7. Debility: lives at [facility] 8. Dispo: prob home tomorrow after HD; have d/w Dr Edwyna Shell MD  pager [phone]    cell [phone]  05/04/2013, 10:20 AM   Recent Labs Lab 04/30/13 1007 05/03/13 0917 05/03/13 1044  NA 141 140 141  K 3.9 4.3 3.9  CL 99 99 100  CO2 30 27  --   GLUCOSE 109* 90 90  BUN 32* 57* 58*  CREATININE 5.88* 9.78* 9.20*  CALCIUM 10.2 8.8  --     Recent Labs Lab 04/30/13 1007  AST 269*  ALT 255*  ALKPHOS 131*  BILITOT 0.5  PROT 6.6  ALBUMIN 3.1*    Recent Labs Lab 04/30/13 1007 05/03/13 0917 05/03/13 1044  WBC 11.4* 6.9  --   NEUTROABS 8.7* 4.6  --   HGB 9.8* 8.3* 9.9*  HCT 30.5* 25.3* 29.0*  MCV 92.7 91.7  --   PLT 103* 96*  --    . calcium acetate  667 mg Oral TID WC  .  cinacalcet  180 mg Oral Q lunch  . darbepoetin (ARANESP) injection - DIALYSIS  100 mcg Intravenous Q Tue-HD  . docusate sodium  200 mg Oral QHS  . escitalopram  20 mg Oral Daily  . feeding supplement (PRO-STAT SUGAR FREE 64)  30 mL Oral BID  . lanthanum  1,000 mg Oral BID WC  . midodrine  10 mg Oral TID WC  . multivitamin  1 tablet Oral QHS  . pantoprazole  40 mg Oral Daily  . risperiDONE  0.25 mg Oral QHS  . simvastatin  20 mg Oral QHS     acetaminophen, bisacodyl, dextrose, LORazepam, morphine injection, ondansetron (ZOFRAN) IV, ondansetron

## 2013-05-05 DIAGNOSIS — N2581 Secondary hyperparathyroidism of renal origin: Secondary | ICD-10-CM

## 2013-05-05 LAB — BASIC METABOLIC PANEL
BUN: 29 mg/dL — ABNORMAL HIGH (ref 6–23)
Calcium: 7.7 mg/dL — ABNORMAL LOW (ref 8.4–10.5)
Chloride: 102 mEq/L (ref 96–112)
GFR calc non Af Amer: 7 mL/min — ABNORMAL LOW (ref >90)
Glucose, Bld: 65 mg/dL — ABNORMAL LOW (ref 70–99)
Potassium: 4.3 mEq/L (ref 3.5–5.1)

## 2013-05-05 LAB — GLUCOSE, CAPILLARY
Glucose-Capillary: 88 mg/dL (ref 70–99)
Glucose-Capillary: 87 mg/dL (ref 70–99)

## 2013-05-05 MED ORDER — ALTEPLASE 2 MG IJ SOLR: 2.0000 mg | Freq: Once | INTRAMUSCULAR | Status: DC | PRN

## 2013-05-05 MED ORDER — LANTHANUM CARBONATE 1000 MG PO CHEW: 1000.0000 mg | CHEWABLE_TABLET | Freq: Two times a day (BID) | ORAL | Status: DC

## 2013-05-05 MED ORDER — SODIUM CHLORIDE 0.9 % IV SOLN: 100.0000 mL | INTRAVENOUS | Status: DC | PRN

## 2013-05-05 MED ORDER — LIDOCAINE-PRILOCAINE 2.5-2.5 % EX CREA: 1.0000 "application " | TOPICAL_CREAM | CUTANEOUS | Status: DC | PRN

## 2013-05-05 MED ORDER — HEPARIN SODIUM (PORCINE) 1000 UNIT/ML DIALYSIS: 1000.0000 [IU] | INTRAMUSCULAR | Status: DC | PRN

## 2013-05-05 MED ORDER — LIDOCAINE HCL (PF) 1 % IJ SOLN: 5.0000 mL | INTRAMUSCULAR | Status: DC | PRN

## 2013-05-05 MED ORDER — OXYCODONE HCL 5 MG PO TABS: 5.0000 mg | ORAL_TABLET | Freq: Two times a day (BID) | ORAL | Status: DC | PRN

## 2013-05-05 MED ORDER — PENTAFLUOROPROP-TETRAFLUOROETH EX AERO: 1.0000 "application " | INHALATION_SPRAY | CUTANEOUS | Status: DC | PRN

## 2013-05-05 MED ORDER — NEPRO/CARBSTEADY PO LIQD: 237.0000 mL | ORAL | Status: DC | PRN

## 2013-05-05 MED ORDER — HEPARIN SODIUM (PORCINE) 1000 UNIT/ML DIALYSIS: 1300.0000 [IU] | INTRAMUSCULAR | Status: DC | PRN

## 2013-05-05 NOTE — Progress Notes (Signed)
[facility] Progress Note  Subjective:   Pleasantly confused. No c/o's.  Resting comfortably on HD  Objective Filed Vitals:   05/05/13 0726 05/05/13 0731 05/05/13 0800 05/05/13 0830  BP: 93/50 91/50 98/48  105/66  Pulse: 74 74 77 80  Temp:      TempSrc:      Resp:      Height:      Weight:      SpO2:       Physical Exam General: Elderly, NAD Heart: RRR, + murmur Lungs: Mostly clear, decreased at based Abdomen: Soft, NT, non-distended + BS Extremities: Trace edema RUE Dialysis Access: L TDC/ RUA AVG ligated, clean/dry dressing  Dialysis: [facility] TTS  4hrs 3K/2.5Ca 400/800 86kg Heparin 2600 L IJ TDC (12/16 replaced)  Hectorol 4 EPO 8600 Venofer none  Recent lab: Hb down 8.5 from 11.5 on 12/4, tsat 65% phos 5.1 pth 971   Assessment/Plan: 1. Bleeding HD cath: replaced 2/16 by [facility], no further bleeding; 2nd use today, no issue thus far 2. Non-cardiac pulm edema: better, further vol removal w HD today. Lower dry wt at d/c 3. Chronic hypotension: takes midodrine 10 tid , soft BPs but tolerating HD 4. ESRD: HD today 5. Anemia: Hgb 8.5, slight drop due to #1 above. Cont esa w darbe 100/wk. Good Tsat, No Fe for now. 6. MBD: cont vitd, sensipar, phoslo (in place of velphoro, not on formulary 7. Debility: lives at [facility] 8. Dispo: prob home (SNF) today after HD; have d/w Dr Yves Dill E. Broadus John, PA-C [facility] Pager [phone] 05/05/2013,9:18 AM  LOS: 2 days   I have seen and examined patient, discussed with PA and agree with assessment and plan as outlined above. Vinson Moselle MD pager [phone]    cell [phone] 05/05/2013, 1:33 PM     Additional Objective Labs: Basic Metabolic Panel:  Recent Labs Lab 05/03/13 0917 05/03/13 1044 05/04/13 0942 05/05/13 0500  NA 140 141 140 139  K 4.3 3.9 4.1 4.3  CL 99 100 101 102  CO2 27  --  29 26  GLUCOSE 90 90 72 65*  BUN 57* 58* 24* 29*  CREATININE 9.78* 9.20* 5.50* 6.56*   CALCIUM 8.8  --  8.1* 7.7*   Liver Function Tests:  Recent Labs Lab 04/30/13 1007  AST 269*  ALT 255*  ALKPHOS 131*  BILITOT 0.5  PROT 6.6  ALBUMIN 3.1*   CBC:  Recent Labs Lab 04/30/13 1007 05/03/13 0917 05/03/13 1044 05/04/13 0942 05/05/13 0500  WBC 11.4* 6.9  --  6.1  --   NEUTROABS 8.7* 4.6  --   --   --   HGB 9.8* 8.3* 9.9* 8.1* 8.5*  HCT 30.5* 25.3* 29.0* 24.7* 27.1*  MCV 92.7 91.7  --  93.2  --   PLT 103* 96*  --  83*  --    CBG:  Recent Labs Lab 05/04/13 0913 05/04/13 1144 05/04/13 1637 05/04/13 2207 05/05/13 0732  GLUCAP 71 97 90 80 88    Studies/Results: Dg Chest Port 1 View  05/04/2013   CLINICAL DATA:  Congestion and edema.  EXAM: PORTABLE CHEST - 1 VIEW  COMPARISON:  05/03/2013  FINDINGS: Enlarged central vascular structures with diffuse pulmonary edema. Heart size remains enlarged. Persistent opacification at the left lung base may represent atelectasis and cannot exclude pleural fluid. Left jugular dialysis catheter tip at the superior cavoatrial junction. Negative for a pneumothorax. Atherosclerotic calcifications in the thoracic aorta.  IMPRESSION: Minimal change  in the pulmonary edema.  Left basilar densities as described.   Electronically Signed   By: Richarda Overlie M.D.   On: 05/04/2013 07:36   Dg Chest Port 1 View  05/03/2013   CLINICAL DATA:  Post diastatic catheter placement  EXAM: PORTABLE CHEST - 1 VIEW  COMPARISON:  Portable exam 1759 hr compared to 0937 hr  FINDINGS: New left jugular dual-lumen central venous catheter with tip projecting over superior right atrium.  No pneumothorax.  Enlargement of cardiac silhouette with pulmonary vascular congestion.  Persistent left lower lobe atelectasis versus consolidation.  Minimal pulmonary edema.  Diffuse osseous demineralization.  IMPRESSION: No pneumothorax following central line placement.  CHF with persistent atelectasis versus consolidation in left lower lobe.   Electronically Signed   By: Ulyses Southward M.D.   On: 05/03/2013 18:17   Dg Chest Portable 1 View  05/03/2013   CLINICAL DATA:  79 year old male with shortness of breath. Recently missed dialysis. Initial encounter.  EXAM: PORTABLE CHEST - 1 VIEW  COMPARISON:  04/30/2013 and earlier.  FINDINGS: Portable AP semi upright view at 0937 hrs. Stable left chest tunneled dual lumen dialysis catheter. Stable cardiomegaly and mediastinal contours. Indistinct perihilar and basilar pulmonary vasculature. Continued confluent retrocardiac opacity. Evidence of small effusions, including fluid in the right minor fissure. No pneumothorax.  IMPRESSION: Basilar predominant pulmonary edema with small pleural effusions and continued confluent retrocardiac opacity - favor atelectasis.   Electronically Signed   By: Augusto Gamble M.D.   On: 05/03/2013 10:01   Dg Fluoro Guide Cv Line-no Report  05/03/2013   CLINICAL DATA: diatek placement   FLOURO GUIDE CV LINE  Fluoroscopy was utilized by the requesting physician.  No radiographic  interpretation.    Medications:   . calcium acetate  667 mg Oral TID WC  . cinacalcet  180 mg Oral Q lunch  . darbepoetin (ARANESP) injection - DIALYSIS  100 mcg Intravenous Q Tue-HD  . docusate sodium  200 mg Oral QHS  . escitalopram  20 mg Oral Daily  . feeding supplement (PRO-STAT SUGAR FREE 64)  30 mL Oral BID  . lanthanum  1,000 mg Oral BID WC  . midodrine  10 mg Oral TID WC  . multivitamin  1 tablet Oral QHS  . pantoprazole  40 mg Oral Daily  . risperiDONE  0.25 mg Oral QHS  . simvastatin  20 mg Oral QHS

## 2013-05-05 NOTE — Procedures (Signed)
I was present at this dialysis session, have reviewed the session itself and made  appropriate changes  Rob Arnice Vanepps MD (pgr) [phone]    (c) [phone] 05/05/2013, 9:38 AM   

## 2013-05-05 NOTE — Discharge Summary (Signed)
Physician Discharge Summary  [name] YNW:[id number] DOB: September 09, 1931 DOA: 05/03/2013  PCP: Bufford Spikes, DO  Admit date: 05/03/2013 Discharge date: 05/05/2013  Time spent: 35 minutes  Recommendations for Outpatient Follow-up:  Patient will be discharged back to [facility]. He is to followup with her primary care physician at the nursing home. He should also follow up with his own primary care physician within one to 2 weeks. Patient should continue dialysis. He should also continue his medications as prescribed.  Discharge Diagnoses:    Pulmonary edema  Bleeding from Diatek Catheter/ Catheter malfunction   HYPERLIPIDEMIA-MIXED   End stage renal disease on dialysis   Depression   GERD (gastroesophageal reflux disease)   Dementia   Anemia   Thrombocytopenia, unspecified   Discharge Condition: Stable  Diet recommendation: Low sodium/Cardiac/ renal diet  Filed Weights   05/04/13 2100 05/05/13 0700 05/05/13 1109  Weight: 83.8 kg (184 lb 11.9 oz) 83.3 kg (183 lb 10.3 oz) 82.7 kg (182 lb 5.1 oz)    History of present illness:  [name] is a 79 y.o. male who was sent the ED with vascular access problems: bleeding around diatek and poor functionality. Reportedly, has had problems with ultrafiltration at most recent HD sessions. Patient has been seen by vascular and nephrology. He is evaluated in PACU and sedated postop after diatek exchange. Also, reportedly has dementia. All history per EDP and review of notes. CXR shows pulmonary edema and nephrology recommends admission for revision of access and dialysis for fluid overload. Hgb 8.8. Potassium ok.  Hospital Course:  This is a 79 year old male history of end-stage renal disease requiring dialysis, anemia, dementia that presents from the nursing home for malfunction of his dialysis catheter. Patient had been bleeding from around his diatek catheter and had been functioning poorly. Patient was admitted and vascular surgery  was consulted for placement of the catheter. Patient underwent a catheter exchange by Dr. Darrick Penna on 05/03/2013. Nephrology was also consulted for end-stage renal disease with dialysis.  It seems the patient's last full hemodialysis session occurred on 04/28/2013.  Patient restarted dialysis on Tuesday Thursday and Saturday. He is also known to have pulmonary edema noted on chest x-ray this is possibly secondary to missing dialysis. This has since resolved, patient has not complained of any shortness of breath during his hospital course. Patient was noted to have hypertension which remained stable during his stay. Patient has anemia secondary to chronic kidney disease which remained stable. Patient was continued on Epo as well as Aranesp.  Patient was known to have thrombocytopenia upon admission however this is chronic. Patient also was continued on his Lexapro as well as risperidone for his depression. His dementia did remain stable. He was continued on Zocor for his hyperlipidemia.  Patient will be discharged back to: The nursing home. He should resume all his medications as prescribed. He should follow up with his primary care physician within one week of discharge as well as a physician at the nursing home. His tuberculin injection twice per week was discontinued. This should be monitored and managed by his physician at the nursing home. Patient should resume a renal/cardiac diet. He should resume normal physical activity as tolerated.  Procedures: Diatek catheter exchange by Dr. Darrick Penna, 05/03/2013  Consultations: Vascular surgery, Dr. Darrick Penna Nephrology  Discharge Exam: Filed Vitals:   05/05/13 1109  BP: 102/41  Pulse: 74  Temp: 98 F (36.7 C)  Resp:    Exam  General: Well developed, well nourished, NAD, appears  stated age  HEENT: NCAT, PERRLA, EOMI, Anicteic Sclera, mucous membranes moist. No pharyngeal erythema or exudates  Neck: Supple, no JVD, no masses  Cardiovascular: S1 S2  auscultated, 2/6 SEM, Regular rate and rhythm.  Respiratory: Coarse breath sounds in all lung fields. Breathing is unlabored  Abdomen: Soft, nontender, nondistended, + bowel sounds  Extremities: warm dry without cyanosis clubbing. Trace edema in lower extremities bilaterally. Right upper extremity and wrapped. Dressing noted on left.  Neuro: Awake and alert, cranial nerves grossly intact.  Skin: Without rashes exudates or nodules  Discharge Instructions  Discharge Orders   Future Appointments Provider Department Dept Phone   05/26/2013 12:30 PM [facility] [place] [facility] [address] [phone]   05/26/2013 1:00 PM Sherren Kerns, MD [facility] [phone]   06/13/2013 1:45 PM Lenn Sink, DPM [facility] [phone]   Future Orders Complete By Expires   Diet - low sodium heart healthy  As directed    Discharge instructions  As directed    Comments:     Patient will be discharged back to [facility]. He is to followup with her primary care physician at the nursing home. He should also follow up with his own primary care physician within one to 2 weeks. Patient should continue dialysis. He should also continue his medications as prescribed.   Increase activity slowly  As directed        Medication List    STOP taking these medications       APLISOL 5 UNIT/0.1ML injection  Generic drug:  tuberculin     VELPHORO 500 MG Chew  Generic drug:  Sucroferric Oxyhydroxide      TAKE these medications       acetaminophen 650 MG CR tablet  Commonly known as:  TYLENOL  Take 650 mg by mouth every 4 (four) hours as needed for pain or fever (and headache).     b complex-vitamin c-folic acid 0.8 MG Tabs tablet  Take 1 tablet by mouth at bedtime.     bisacodyl 10 MG suppository  Commonly known as:  DULCOLAX  Place 10 mg rectally daily as needed (for no bowel movement).     calcium acetate 667 MG capsule  Commonly known  as:  PHOSLO  Take 667 mg by mouth 3 (three) times daily with meals.     cinacalcet 90 MG tablet  Commonly known as:  SENSIPAR  Take 180 mg by mouth daily at 12 noon.     dextrose 5 G chewable tablet  Chew 37.5 g by mouth as needed for low blood sugar. During dialysis PRN     diphenhydrAMINE 25 MG tablet  Commonly known as:  BENADRYL  Take 25 mg by mouth as needed for itching.     docusate sodium 100 MG capsule  Commonly known as:  COLACE  Take 200 mg by mouth at bedtime.     escitalopram 20 MG tablet  Commonly known as:  LEXAPRO  Take 20 mg by mouth daily.     ethyl chloride spray  Apply 1 application topically See admin instructions. Apply to dialysis site prior to dialysis (tuesday, Thursday, Saturday)     feeding supplement (PRO-STAT SUGAR FREE 64) Liqd  Take 30 mLs by mouth 2 (two) times daily.     lanthanum 1000 MG chewable tablet  Commonly known as:  FOSRENOL  Chew 1 tablet (1,000 mg total) by mouth 2 (two) times daily with a meal.  loperamide 2 MG capsule  Commonly known as:  IMODIUM  Take 2 mg by mouth as needed for diarrhea or loose stools.     LORazepam 0.5 MG tablet  Commonly known as:  ATIVAN  Take 0.5 mg by mouth every 4 (four) hours as needed for anxiety.     midodrine 10 MG tablet  Commonly known as:  PROAMATINE  Take 10 mg by mouth 3 (three) times daily.     MULTIVITAMIN PO  Take 1 tablet by mouth daily.     omeprazole 20 MG capsule  Commonly known as:  PRILOSEC  Take 20 mg by mouth daily.     oxyCODONE 5 MG immediate release tablet  Commonly known as:  Oxy IR/ROXICODONE  Take 1 tablet (5 mg total) by mouth 2 (two) times daily as needed for severe pain.     risperiDONE 0.25 MG tablet  Commonly known as:  RISPERDAL  Take 0.25 mg by mouth at bedtime.     simvastatin 20 MG tablet  Commonly known as:  ZOCOR  Take 20 mg by mouth at bedtime.       No Known Allergies     Follow-up Information   Follow up with REED, TIFFANY, DO. Schedule  an appointment as soon as possible for a visit in 1 week.   Specialty:  Geriatric Medicine   Contact information:   [address] [place] [address] [phone]       Follow up with Physician at the nursing home. Schedule an appointment as soon as possible for a visit in 2 days.       The results of significant diagnostics from this hospitalization (including imaging, microbiology, ancillary and laboratory) are listed below for reference.    Significant Diagnostic Studies: Dg Chest 2 View  04/30/2013   CLINICAL DATA:  Bleeding from the left-sided hemodialysis catheter.  EXAM: CHEST  2 VIEW  COMPARISON:  04/26/2013  FINDINGS: Cardiac silhouette is enlarged but stable. There are small right and moderate left pleural effusions. Central vascular congestion is noted, but there is no overt pulmonary edema. Dependent lower lobe atelectasis is noted adjacent to the pleural effusions.  Left internal jugular tunneled dual lumen central venous catheter is stable. No pneumothorax.  IMPRESSION: 1. Tunneled dual lumen left internal jugular central venous catheter is stable and well positioned. 2. Central vascular congestion is noted, but the pulmonary edema noted previously has resolved. 3. Moderate left and small right pleural effusion with associated dependent atelectasis. This is stable. Stable cardiomegaly.   Electronically Signed   By: Amie Portland M.D.   On: 04/30/2013 10:23   US Abdomen Complete  04/30/2013   CLINICAL DATA:  Abdominal pain.  Elevated LFTs  EXAM: ULTRASOUND ABDOMEN COMPLETE  COMPARISON:  None.  FINDINGS: Gallbladder:  Small bowel gallstones appreciated within the gallbladder. Gallbladder wall thickness 2.4 mm. There is no evidence of pericholecystic fluid nor a sonographic Murphy's sign.  Common bile duct:  Diameter: 4.2 mm  Liver:  No focal lesion identified. Within normal limits in parenchymal echogenicity. Hepatopetal flow is identified within the portal vein.  IVC:  No  abnormality visualized.  Pancreas:  Visualized portion unremarkable.  Spleen:  Size and appearance within normal limits.  Right Kidney:  Length: 8.7 cm. Echogenicity within normal limits. No mass or hydronephrosis visualized. 2 x 2 x 1.8 cm cyst posterior midpole. There is decreased corticomedullary differentiation without evidence of hydronephrosis, calculi, or solid masses.  Left Kidney:  Length: The left kidney was  not visualized secondary to overlying bowel gas.  Abdominal aorta:  No aneurysm visualized.  Other findings:  None.  IMPRESSION: 1. Cholelithiasis without evidence of cholecystitis 2. Benign appearing cyst right kidney 3. Renal findings consistent with the patient's history of chronic renal disease   Electronically Signed   By: Salome Holmes M.D.   On: 04/30/2013 15:09   Dg Chest Port 1 View  05/04/2013   CLINICAL DATA:  Congestion and edema.  EXAM: PORTABLE CHEST - 1 VIEW  COMPARISON:  05/03/2013  FINDINGS: Enlarged central vascular structures with diffuse pulmonary edema. Heart size remains enlarged. Persistent opacification at the left lung base may represent atelectasis and cannot exclude pleural fluid. Left jugular dialysis catheter tip at the superior cavoatrial junction. Negative for a pneumothorax. Atherosclerotic calcifications in the thoracic aorta.  IMPRESSION: Minimal change in the pulmonary edema.  Left basilar densities as described.   Electronically Signed   By: Richarda Overlie M.D.   On: 05/04/2013 07:36   Dg Chest Port 1 View  05/03/2013   CLINICAL DATA:  Post diastatic catheter placement  EXAM: PORTABLE CHEST - 1 VIEW  COMPARISON:  Portable exam 1759 hr compared to 0937 hr  FINDINGS: New left jugular dual-lumen central venous catheter with tip projecting over superior right atrium.  No pneumothorax.  Enlargement of cardiac silhouette with pulmonary vascular congestion.  Persistent left lower lobe atelectasis versus consolidation.  Minimal pulmonary edema.  Diffuse osseous  demineralization.  IMPRESSION: No pneumothorax following central line placement.  CHF with persistent atelectasis versus consolidation in left lower lobe.   Electronically Signed   By: Ulyses Southward M.D.   On: 05/03/2013 18:17   Dg Chest Portable 1 View  05/03/2013   CLINICAL DATA:  79 year old male with shortness of breath. Recently missed dialysis. Initial encounter.  EXAM: PORTABLE CHEST - 1 VIEW  COMPARISON:  04/30/2013 and earlier.  FINDINGS: Portable AP semi upright view at 0937 hrs. Stable left chest tunneled dual lumen dialysis catheter. Stable cardiomegaly and mediastinal contours. Indistinct perihilar and basilar pulmonary vasculature. Continued confluent retrocardiac opacity. Evidence of small effusions, including fluid in the right minor fissure. No pneumothorax.  IMPRESSION: Basilar predominant pulmonary edema with small pleural effusions and continued confluent retrocardiac opacity - favor atelectasis.   Electronically Signed   By: Augusto Gamble M.D.   On: 05/03/2013 10:01   Dg Chest Portable 1 View  04/26/2013   CLINICAL DATA:  Postoperative radiograph; left Diatek placement.  EXAM: PORTABLE CHEST - 1 VIEW  COMPARISON:  Chest radiograph performed 12/05/2010, and CT of the chest performed 03/16/2013  FINDINGS: The patient's left-sided dual-lumen catheter is noted ending about the distal SVC.  There is an increased moderate left-sided pleural effusion, and a small right pleural effusion. Associated bilateral airspace opacification may reflect pulmonary edema or pneumonia. No pneumothorax is seen, though the lung apices are obscured by the patient's head.  The cardiomediastinal silhouette is borderline enlarged. No acute osseous abnormalities are identified.  IMPRESSION: 1. Left-sided dual-lumen catheter noted ending about the distal SVC. 2. Increased moderate left-sided pleural effusion, and small right pleural effusion. Associated diffuse bilateral airspace opacification may reflect pulmonary edema or  pneumonia. 3. Borderline cardiomegaly.   Electronically Signed   By: Roanna Raider M.D.   On: 04/26/2013 22:47   Dg Fluoro Guide Cv Line-no Report  05/03/2013   CLINICAL DATA: diatek placement   FLOURO GUIDE CV LINE  Fluoroscopy was utilized by the requesting physician.  No radiographic  interpretation.  Dg Fluoro Guide Cv Line-no Report  04/26/2013   CLINICAL DATA: Bleeding Arteriovenous Fistula   FLOURO GUIDE CV LINE  Fluoroscopy was utilized by the requesting physician.  No radiographic  interpretation.     Microbiology: Recent Results (from the past 240 hour(s))  MRSA PCR SCREENING     Status: None   Collection Time    04/27/13 12:57 AM      Result Value Range Status   MRSA by PCR NEGATIVE  NEGATIVE Final   Comment:            The GeneXpert MRSA Assay (FDA     approved for NASAL specimens     only), is one component of a     comprehensive MRSA colonization     surveillance program. It is not     intended to diagnose MRSA     infection nor to guide or     monitor treatment for     MRSA infections.     Labs: Basic Metabolic Panel:  Recent Labs Lab 04/30/13 1007 05/03/13 0917 05/03/13 1044 05/04/13 0942 05/05/13 0500  NA 141 140 141 140 139  K 3.9 4.3 3.9 4.1 4.3  CL 99 99 100 101 102  CO2 30 27  --  29 26  GLUCOSE 109* 90 90 72 65*  BUN 32* 57* 58* 24* 29*  CREATININE 5.88* 9.78* 9.20* 5.50* 6.56*  CALCIUM 10.2 8.8  --  8.1* 7.7*   Liver Function Tests:  Recent Labs Lab 04/30/13 1007  AST 269*  ALT 255*  ALKPHOS 131*  BILITOT 0.5  PROT 6.6  ALBUMIN 3.1*   No results found for this basename: LIPASE, AMYLASE,  in the last 168 hours No results found for this basename: AMMONIA,  in the last 168 hours CBC:  Recent Labs Lab 04/30/13 1007 05/03/13 0917 05/03/13 1044 05/04/13 0942 05/05/13 0500  WBC 11.4* 6.9  --  6.1  --   NEUTROABS 8.7* 4.6  --   --   --   HGB 9.8* 8.3* 9.9* 8.1* 8.5*  HCT 30.5* 25.3* 29.0* 24.7* 27.1*  MCV 92.7 91.7  --  93.2   --   PLT 103* 96*  --  83*  --    Cardiac Enzymes: No results found for this basename: CKTOTAL, CKMB, CKMBINDEX, TROPONINI,  in the last 168 hours BNP: BNP (last 3 results) No results found for this basename: PROBNP,  in the last 8760 hours CBG:  Recent Labs Lab 05/04/13 1144 05/04/13 1637 05/04/13 2207 05/05/13 0732 05/05/13 1124  GLUCAP 97 90 80 88 87       Signed:  Braian Tijerina  [facility] 05/05/2013, 12:36 PM

## 2013-05-05 NOTE — Clinical Social Work Note (Signed)
Patient ready for discharge back to [facility]. Facility notified, d/c packet compiled and d/c information transmitted to facility. CSW facilitated transport via ambulance.  Genelle Bal, MSW, LCSW [phone]

## 2013-05-06 ENCOUNTER — Encounter: Payer: Self-pay | Admitting: Internal Medicine

## 2013-05-06 ENCOUNTER — Non-Acute Institutional Stay (SKILLED_NURSING_FACILITY): Payer: Medicare Other | Admitting: Internal Medicine

## 2013-05-06 DIAGNOSIS — K5900 Constipation, unspecified: Secondary | ICD-10-CM | POA: Insufficient documentation

## 2013-05-06 DIAGNOSIS — F329 Major depressive disorder, single episode, unspecified: Secondary | ICD-10-CM

## 2013-05-06 DIAGNOSIS — K219 Gastro-esophageal reflux disease without esophagitis: Secondary | ICD-10-CM

## 2013-05-06 DIAGNOSIS — N186 End stage renal disease: Secondary | ICD-10-CM

## 2013-05-06 DIAGNOSIS — N2581 Secondary hyperparathyroidism of renal origin: Secondary | ICD-10-CM

## 2013-05-06 LAB — GLUCOSE, CAPILLARY: Glucose-Capillary: 73 mg/dL (ref 70–99)

## 2013-05-06 NOTE — Progress Notes (Signed)
Patient ID: [name], male   DOB: Jan 18, 1932, 79 y.o.   MRN: [id number]    [name]  Code Status: full code  No Known Allergies  Chief Complaint: re-admit  HPI:   79 y/o male patient is readmitted to the facility after hospital admission from 05/03/13- 05/05/13 with vascular access problem at dialysis port. He was having some bleeding around the diatek site and they were not able to use the catheter for dialysis. He was seen by nephrology and vascular. His diatek was exchanged. He was also noted to have pulmonary edema with the fluid backing up and underwent hemodialysis. He was then sent back to the facility.  Patient was seen in his room today. He is awake and alert and in no distress He denies any complaints  Review of Systems  Constitutional: Negative for fever, chills, weight loss, malaise/fatigue and diaphoresis.  HENT: Negative for congestion, hearing loss and sore throat.   Eyes: Negative for blurred vision, double vision and discharge.  Respiratory: Negative for cough, sputum production, shortness of breath and wheezing.   Cardiovascular: Negative for chest pain, palpitations, orthopnea and leg swelling.  Gastrointestinal: Negative for heartburn, nausea, vomiting, abdominal pain, diarrhea and constipation.  Genitourinary: Negative for dysuria, urgency, frequency and flank pain.  Musculoskeletal: Negative for back pain, falls, joint pain and myalgias.  Skin: Negative for itching and rash.  Neurological: Positive for weakness. Negative for dizziness, tingling, focal weakness and headaches.  Psychiatric/Behavioral: Negative for depression and memory loss. The patient is not nervous/anxious.   Past Medical History  Diagnosis Date  . Diabetes mellitus   . Hypertension   . Arthritis   . Cataract   . CAD (coronary artery disease)   . Secondary hyperthyroidism   . Dementia   . History of thrombocytopenia   . GERD (gastroesophageal reflux disease)   .  Anxiety     paranoia  . Stroke   . Chronic kidney disease     Tue, Thurs, Sat dialysis, goes to [address]  . Anemia    Past Surgical History  Procedure Laterality Date  . Arteriovenous graft placement  05/01/10    Left upper arm brachial artery to axillary vein AVG  . Thrombectomy / arteriovenous graft revision  11/07/10    Left upper arm AVG  by Dr. Imogene Burn  . Diatek catheter insertion  12/05/10    Left side  . Bone spur      foot surgery  . Av fistula placement  04/14/2011    Procedure: ARTERIOVENOUS (AV) FISTULA CREATION;  Surgeon: Nilda Simmer, MD;  Location: [facility] OR;  Service: Vascular;  Laterality: Right;   Second  stage basilic vein transposition  . Knee surgery Left   . Foot surgery Left   . Bunionectomy    . Ligation of arteriovenous  fistula Right 04/26/2013    Procedure: LIGATION OF ARTERIOVENOUS  FISTULA;  Surgeon: Sherren Kerns, MD;  Location: [facility] OR;  Service: Vascular;  Laterality: Right;  . Insertion of dialysis catheter Left 04/26/2013    Procedure: INSERTION OF DIALYSIS CATHETER;  Surgeon: Sherren Kerns, MD;  Location: [facility] OR;  Service: Vascular;  Laterality: Left;   Current Outpatient Prescriptions on File Prior to Visit  Medication Sig Dispense Refill  . acetaminophen (TYLENOL) 650 MG CR tablet Take 650 mg by mouth every 4 (four) hours as needed for pain or fever (and headache).      . Amino Acids-Protein Hydrolys (FEEDING SUPPLEMENT, PRO-STAT SUGAR FREE 64,) LIQD  Take 30 mLs by mouth 2 (two) times daily.      Marland Kitchen b complex-vitamin c-folic acid (NEPHRO-VITE) 0.8 MG TABS tablet Take 1 tablet by mouth at bedtime.      . bisacodyl (DULCOLAX) 10 MG suppository Place 10 mg rectally daily as needed (for no bowel movement).       . calcium acetate (PHOSLO) 667 MG capsule Take 667 mg by mouth 3 (three) times daily with meals.       . cinacalcet (SENSIPAR) 90 MG tablet Take 180 mg by mouth daily at 12 noon.       Marland Kitchen dextrose 5 G chewable tablet Chew 37.5 g by mouth  as needed for low blood sugar. During dialysis PRN      . diphenhydrAMINE (BENADRYL) 25 MG tablet Take 25 mg by mouth as needed for itching.      . docusate sodium (COLACE) 100 MG capsule Take 200 mg by mouth at bedtime.       Marland Kitchen escitalopram (LEXAPRO) 20 MG tablet Take 20 mg by mouth daily.      Marland Kitchen ethyl chloride spray Apply 1 application topically See admin instructions. Apply to dialysis site prior to dialysis (tuesday, Thursday, Saturday)      . lanthanum (FOSRENOL) 1000 MG chewable tablet Chew 1 tablet (1,000 mg total) by mouth 2 (two) times daily with a meal.      . loperamide (IMODIUM) 2 MG capsule Take 2 mg by mouth as needed for diarrhea or loose stools.      Marland Kitchen LORazepam (ATIVAN) 0.5 MG tablet Take 0.5 mg by mouth every 4 (four) hours as needed for anxiety.      . midodrine (PROAMATINE) 10 MG tablet Take 10 mg by mouth 3 (three) times daily.      . Multiple Vitamins-Minerals (MULTIVITAMIN PO) Take 1 tablet by mouth daily.      Marland Kitchen omeprazole (PRILOSEC) 20 MG capsule Take 20 mg by mouth daily.      Marland Kitchen oxyCODONE (OXY IR/ROXICODONE) 5 MG immediate release tablet Take 1 tablet (5 mg total) by mouth 2 (two) times daily as needed for severe pain.  30 tablet  0  . risperiDONE (RISPERDAL) 0.25 MG tablet Take 0.25 mg by mouth at bedtime.       . simvastatin (ZOCOR) 20 MG tablet Take 20 mg by mouth at bedtime.        No current facility-administered medications on file prior to visit.   Physical exam BP 126/82  Pulse 64  Temp(Src) 97.4 F (36.3 C)  Resp 18  Ht 5\' 6"  (1.676 m)  Wt 190 lb (86.183 kg)  BMI 30.68 kg/m2  SpO2 96%   Constitutional: He appears well-developed and well-nourished.  overweight  Neck: Neck supple. No JVD present. No thyromegaly present.   Cardiovascular: Normal rate and regular rhythm.    Respiratory: Effort normal. decreased basilar air entry, no crackles or rhonchi appreciated. No respiratory distress. He has no wheezes.   GI: Soft. Bowel sounds are normal. He  exhibits no distension. There is no tenderness.  Musculoskeletal: Normal range of motion. Edema in right arm is minimal, sutures in right arm at site of prior fistula. New dialysis catheter site clean on left chest Neurological: He is alert.   Skin: Skin is warm and dry. Sacral wound stage 2 Psychiatric: He has a normal mood and affect.    Labs reviewed CBC    Component Value Date/Time   WBC 6.1 05/04/2013 0942   RBC 2.65* 05/04/2013  0942   HGB 8.5* 05/05/2013 0500   HCT 27.1* 05/05/2013 0500   PLT 83* 05/04/2013 0942   MCV 93.2 05/04/2013 0942   MCH 30.6 05/04/2013 0942   MCHC 32.8 05/04/2013 0942   RDW 17.2* 05/04/2013 0942   LYMPHSABS 1.3 05/03/2013 0917   MONOABS 0.9 05/03/2013 0917   EOSABS 0.1 05/03/2013 0917   BASOSABS 0.0 05/03/2013 0917    CMP     Component Value Date/Time   NA 139 05/05/2013 0500   K 4.3 05/05/2013 0500   CL 102 05/05/2013 0500   CO2 26 05/05/2013 0500   GLUCOSE 65* 05/05/2013 0500   BUN 29* 05/05/2013 0500   CREATININE 6.56* 05/05/2013 0500   CALCIUM 7.7* 05/05/2013 0500   PROT 6.6 04/30/2013 1007   ALBUMIN 3.1* 04/30/2013 1007   AST 269* 04/30/2013 1007   ALT 255* 04/30/2013 1007   ALKPHOS 131* 04/30/2013 1007   BILITOT 0.5 04/30/2013 1007   GFRNONAA 7* 05/05/2013 0500   GFRAA 8* 05/05/2013 0500    imagings reviewed  Dg Chest 2 View  04/30/2013   CLINICAL DATA:  Bleeding from the left-sided hemodialysis catheter.  EXAM: CHEST  2 VIEW  COMPARISON:  04/26/2013  FINDINGS: Cardiac silhouette is enlarged but stable. There are small right and moderate left pleural effusions. Central vascular congestion is noted, but there is no overt pulmonary edema. Dependent lower lobe atelectasis is noted adjacent to the pleural effusions.  Left internal jugular tunneled dual lumen central venous catheter is stable. No pneumothorax.  IMPRESSION: 1. Tunneled dual lumen left internal jugular central venous catheter is stable and well positioned. 2. Central  vascular congestion is noted, but the pulmonary edema noted previously has resolved. 3. Moderate left and small right pleural effusion with associated dependent atelectasis. This is stable. Stable cardiomegaly.   Electronically Signed   By: Amie Portland M.D.   On: 04/30/2013 10:23   US Abdomen Complete  04/30/2013   CLINICAL DATA:  Abdominal pain.  Elevated LFTs  EXAM: ULTRASOUND ABDOMEN COMPLETE  COMPARISON:  None.  FINDINGS: Gallbladder:  Small bowel gallstones appreciated within the gallbladder. Gallbladder wall thickness 2.4 mm. There is no evidence of pericholecystic fluid nor a sonographic Murphy's sign.  Common bile duct:  Diameter: 4.2 mm  Liver:  No focal lesion identified. Within normal limits in parenchymal echogenicity. Hepatopetal flow is identified within the portal vein.  IVC:  No abnormality visualized.  Pancreas:  Visualized portion unremarkable.  Spleen:  Size and appearance within normal limits.  Right Kidney:  Length: 8.7 cm. Echogenicity within normal limits. No mass or hydronephrosis visualized. 2 x 2 x 1.8 cm cyst posterior midpole. There is decreased corticomedullary differentiation without evidence of hydronephrosis, calculi, or solid masses.  Left Kidney:  Length: The left kidney was not visualized secondary to overlying bowel gas.  Abdominal aorta:  No aneurysm visualized. Other findings:  None.  IMPRESSION: 1. Cholelithiasis without evidence of cholecystitis 2. Benign appearing cyst right kidney 3. Renal findings consistent with the patient's history of chronic renal disease   Electronically Signed   By: Salome Holmes M.D.   On: 04/30/2013 15:09   Dg Chest Port 1 View  05/04/2013   CLINICAL DATA:  Congestion and edema.  EXAM: PORTABLE CHEST - 1 VIEW  COMPARISON:  05/03/2013  FINDINGS: Enlarged central vascular structures with diffuse pulmonary edema. Heart size remains enlarged. Persistent opacification at the left lung base may represent atelectasis and cannot exclude pleural  fluid. Left jugular dialysis catheter tip  at the superior cavoatrial junction. Negative for a pneumothorax. Atherosclerotic calcifications in the thoracic aorta.  IMPRESSION: Minimal change in the pulmonary edema.  Left basilar densities as described.   Electronically Signed   By: Richarda Overlie M.D.   On: 05/04/2013 07:36   Dg Chest Port 1 View  05/03/2013   CLINICAL DATA:  Post diastatic catheter placement  EXAM: PORTABLE CHEST - 1 VIEW  COMPARISON:  Portable exam 1759 hr compared to 0937 hr  FINDINGS: New left jugular dual-lumen central venous catheter with tip projecting over superior right atrium.  No pneumothorax.  Enlargement of cardiac silhouette with pulmonary vascular congestion.  Persistent left lower lobe atelectasis versus consolidation.  Minimal pulmonary edema.  Diffuse osseous demineralization.  IMPRESSION: No pneumothorax following central line placement.  CHF with persistent atelectasis versus consolidation in left lower lobe.   Electronically Signed   By: Ulyses Southward M.D.   On: 05/03/2013 18:17   Dg Chest Portable 1 View  05/03/2013   CLINICAL DATA:  79 year old male with shortness of breath. Recently missed dialysis. Initial encounter.  EXAM: PORTABLE CHEST - 1 VIEW  COMPARISON:  04/30/2013 and earlier.  FINDINGS: Portable AP semi upright view at 0937 hrs. Stable left chest tunneled dual lumen dialysis catheter. Stable cardiomegaly and mediastinal contours. Indistinct perihilar and basilar pulmonary vasculature. Continued confluent retrocardiac opacity. Evidence of small effusions, including fluid in the right minor fissure. No pneumothorax.  IMPRESSION: Basilar predominant pulmonary edema with small pleural effusions and continued confluent retrocardiac opacity - favor atelectasis.   Electronically Signed   By: Augusto Gamble M.D.   On: 05/03/2013 10:01   Dg Chest Portable 1 View  04/26/2013   CLINICAL DATA:  Postoperative radiograph; left Diatek placement.  EXAM: PORTABLE CHEST - 1 VIEW   COMPARISON:  Chest radiograph performed 12/05/2010, and CT of the chest performed 03/16/2013  FINDINGS: The patient's left-sided dual-lumen catheter is noted ending about the distal SVC.  There is an increased moderate left-sided pleural effusion, and a small right pleural effusion. Associated bilateral airspace opacification may reflect pulmonary edema or pneumonia. No pneumothorax is seen, though the lung apices are obscured by the patient's head.  The cardiomediastinal silhouette is borderline enlarged. No acute osseous abnormalities are identified.  IMPRESSION: 1. Left-sided dual-lumen catheter noted ending about the distal SVC. 2. Increased moderate left-sided pleural effusion, and small right pleural effusion. Associated diffuse bilateral airspace opacification may reflect pulmonary edema or pneumonia. 3. Borderline cardiomegaly.   Electronically Signed   By: Roanna Raider M.D.   On: 04/26/2013 22:47   Dg Fluoro Guide Cv Line-no Report  05/03/2013   CLINICAL DATA: diatek placement   FLOURO GUIDE CV LINE  Fluoroscopy was utilized by the requesting physician.  No radiographic  interpretation.    Dg Fluoro Guide Cv Line-no Report  04/26/2013   CLINICAL DATA: Bleeding Arteriovenous Fistula   FLOURO GUIDE CV LINE  Fluoroscopy was utilized by the requesting physician.  No radiographic  interpretation.     Assessment/plan  End stage renal disease on dialysis The remains on dialysis three days per week. continue 1200 cc fluid restriction with phoslo 667 mg three times daily and with snacks, sensipar, fosrenol  HYPERLIPIDEMIA-MIXED Will continue zocor 20 mg daily   GERD (gastroesophageal reflux disease) Will continue prilosec 20 mg daily   Constipation Will continue colace 200 mg nightly   Depression continue lexapro 20 mg daily and ativan 0.5 mg every 4 hours as needed for anxiety. Also continue his risperdal 0.25  nightly and 0.25 mg on dialysis and will monitor his status    Hyperparathyroidism, secondary renal Is followed by nephrology is taking sensipar 180 mg daily

## 2013-05-10 ENCOUNTER — Encounter (HOSPITAL_COMMUNITY): Payer: Self-pay | Admitting: Vascular Surgery

## 2013-05-11 ENCOUNTER — Encounter (HOSPITAL_COMMUNITY): Payer: Medicare Other

## 2013-05-11 ENCOUNTER — Ambulatory Visit: Payer: Medicare Other | Admitting: Vascular Surgery

## 2013-05-25 ENCOUNTER — Encounter: Payer: Self-pay | Admitting: Vascular Surgery

## 2013-05-26 ENCOUNTER — Encounter: Payer: Self-pay | Admitting: Vascular Surgery

## 2013-05-26 ENCOUNTER — Ambulatory Visit (HOSPITAL_COMMUNITY)
Admit: 2013-05-26 | Discharge: 2013-05-26 | Disposition: A | Discharge status: Home / Self Care | Payer: Medicare Other | Attending: Vascular Surgery | Admitting: Vascular Surgery

## 2013-05-26 ENCOUNTER — Ambulatory Visit (INDEPENDENT_AMBULATORY_CARE_PROVIDER_SITE_OTHER): Payer: Medicare Other | Admitting: Vascular Surgery

## 2013-05-26 VITALS — BP 97/53 | HR 77 | Ht 66.0 in | Wt 190.0 lb

## 2013-05-26 DIAGNOSIS — E1159 Type 2 diabetes mellitus with other circulatory complications: Secondary | ICD-10-CM

## 2013-05-26 DIAGNOSIS — N186 End stage renal disease: Secondary | ICD-10-CM

## 2013-05-26 DIAGNOSIS — I739 Peripheral vascular disease, unspecified: Secondary | ICD-10-CM | POA: Insufficient documentation

## 2013-05-26 NOTE — Progress Notes (Signed)
Patient is a 80 year old male who returns today for followup and removal of sutures from recent ligation of a bleeding right upper arm fistula. He also presents today for evaluation of ischemia of his toes per Dr. Darrick Pennaeterding. The patient is very deconditioned and wheelchair-bound. He was unable to have a noninvasive arterial vascular exam today because he is to deconditioned to get out of the wheelchair. He did not have any family members present for his office visit today. He is demented. He denies pain in his feet. He states he does have some numbness and tingling. He has no open wounds on the foot. He is currently dialyzing via a catheter.  Physical exam:  Filed Vitals:   05/26/13 1335  BP: 97/53  Pulse: 77  Height: 5\' 6"  (1.676 m)  Weight: 190 lb (86.183 kg)  SpO2: 99%     Right upper extremity: Hand well-perfused all sutures were removed from the right upper extremity today.   Lower extremities: No palpable pedal or popliteal pulses. 2+ femoral pulses.  Skin: Dry scaly skin the feet bilaterally no open ulcers no cracks no sores no fluctuance no tenderness on palpation  Assessment: #1 healing incisions right upper extremity status post ligation of bleeding fistula #2 evidence of physical exam of peripheral arterial disease; however, the patient is not a candidate for any type of revascularization procedure due to his overall deconditioning and dementia. He was unable to tolerate even a noninvasive arterial exam today. He currently has no indication for intervention as he does not have any imminent risk of limb loss. However, the only operation he would be a candidate for in the event of a nonhealing wound or gangrene of his feet would be an amputation due to his overall deconditioned state.  Plan: Followup when necessary  Fabienne Brunsharles Shrey Boike, MD [facility] Office: [phone] Pager: [phone]

## 2013-05-27 ENCOUNTER — Encounter: Payer: Self-pay | Admitting: Internal Medicine

## 2013-05-27 ENCOUNTER — Non-Acute Institutional Stay (SKILLED_NURSING_FACILITY): Payer: Medicare Other | Admitting: Internal Medicine

## 2013-05-27 DIAGNOSIS — F3289 Other specified depressive episodes: Secondary | ICD-10-CM

## 2013-05-27 DIAGNOSIS — N186 End stage renal disease: Secondary | ICD-10-CM

## 2013-05-27 DIAGNOSIS — Z992 Dependence on renal dialysis: Secondary | ICD-10-CM

## 2013-05-27 DIAGNOSIS — E785 Hyperlipidemia, unspecified: Secondary | ICD-10-CM

## 2013-05-27 DIAGNOSIS — I959 Hypotension, unspecified: Secondary | ICD-10-CM

## 2013-05-27 DIAGNOSIS — K219 Gastro-esophageal reflux disease without esophagitis: Secondary | ICD-10-CM

## 2013-05-27 DIAGNOSIS — F32A Depression, unspecified: Secondary | ICD-10-CM

## 2013-05-27 DIAGNOSIS — F329 Major depressive disorder, single episode, unspecified: Secondary | ICD-10-CM

## 2013-05-27 NOTE — Progress Notes (Signed)
Patient ID: Jerome BeechJoseph N Digman, male   DOB: 1932/04/01, 80 y.o.   MRN: [id number]    [facility] [place]  No Known Allergies  Chief Complaint  Patient presents with  . Medical Managment of Chronic Issues    HPI:   80 y/o male patient is seen for routine follow up. He is in no distress, oriented to person and place and denies any complaints. No new concern from staff. He has improved his strength by working with therapy ad tolerating dialysis well. No new catheter complications. Swelling in his arm has subsided  Review of Systems   Constitutional: Negative for fever, chills, weight loss, malaise/fatigue and diaphoresis.   HENT: Negative for congestion, hearing loss and sore throat.    Eyes: Negative for blurred vision, double vision and discharge.   Respiratory: Negative for cough, sputum production, shortness of breath and wheezing.    Cardiovascular: Negative for chest pain, palpitations, orthopnea and leg swelling.   Gastrointestinal: Negative for heartburn, nausea, vomiting, abdominal pain, diarrhea and constipation.   Genitourinary: Negative for dysuria, urgency, frequency and flank pain.   Musculoskeletal: Negative for back pain, falls, joint pain and myalgias.   Skin: Negative for itching and rash.   Neurological: Negative for dizziness, tingling, focal weakness and headaches.   Psychiatric/Behavioral: Negative for depression and memory loss. The patient is not nervous/anxious.     Past Medical History  Diagnosis Date  . Diabetes mellitus   . Hypertension   . Arthritis   . Cataract   . CAD (coronary artery disease)   . Secondary hyperthyroidism   . Dementia   . History of thrombocytopenia   . GERD (gastroesophageal reflux disease)   . Anxiety     paranoia  . Stroke   . Chronic kidney disease     Tue, Thurs, Sat dialysis[facility] [address]  . Anemia    Medication reviewed. [facility]  Physical exam Vital signs and nursing notes reviewed  Constitutional: He  appears well-developed and well-nourished.  overweight  Neck: Neck supple. No JVD present. No thyromegaly present.   Cardiovascular: Normal rate and regular rhythm.    Respiratory: Effort normal. decreased basilar air entry, no crackles or rhonchi appreciated. No respiratory distress. He has no wheezes.   GI: Soft. Bowel sounds are normal. He exhibits no distension. There is no tenderness.  Musculoskeletal: Normal range of motion. New dialysis catheter site clean on left chest. Edema in arm resolved. Edema trace present in legs Neurological: He is alert.   Skin: Skin is warm and dry Psychiatric: He has a normal mood and affect.    Assessment/plan  Depression continue lexapro 20 mg daily and ativan 0.5 mg every 4 hours as needed for anxiety. Also continue his risperdal 0.25 nightly and 0.25 mg on dialysis and will monitor his status  End stage renal disease on dialysis Continue dialysis 3/week and renal supplement  hyperlipidemia Will continue zocor 20 mg daily   GERD  Will continue prilosec 20 mg daily   Hypotension bp well controlled. On midodrine for now, monitor clinically

## 2013-05-30 ENCOUNTER — Other Ambulatory Visit: Payer: Self-pay | Admitting: Vascular Surgery

## 2013-05-30 DIAGNOSIS — Z01810 Encounter for preprocedural cardiovascular examination: Secondary | ICD-10-CM

## 2013-05-30 DIAGNOSIS — N186 End stage renal disease: Secondary | ICD-10-CM

## 2013-06-07 ENCOUNTER — Encounter (HOSPITAL_COMMUNITY): Payer: Self-pay | Admitting: Vascular Surgery

## 2013-06-07 NOTE — OR Nursing (Signed)
LATE ENTRY: Procedure end time entered 

## 2013-06-13 ENCOUNTER — Ambulatory Visit: Payer: Medicare Other | Admitting: Podiatry

## 2013-06-14 ENCOUNTER — Encounter: Payer: Self-pay | Admitting: Vascular Surgery

## 2013-06-15 ENCOUNTER — Ambulatory Visit (INDEPENDENT_AMBULATORY_CARE_PROVIDER_SITE_OTHER): Payer: Medicare Other | Admitting: Vascular Surgery

## 2013-06-15 ENCOUNTER — Ambulatory Visit (INDEPENDENT_AMBULATORY_CARE_PROVIDER_SITE_OTHER)
Admission: RE | Admit: 2013-06-15 | Discharge: 2013-06-15 | Disposition: A | Discharge status: Home / Self Care | Payer: Medicare Other | Source: Ambulatory Visit | Attending: Vascular Surgery | Admitting: Vascular Surgery

## 2013-06-15 ENCOUNTER — Encounter: Payer: Self-pay | Admitting: Vascular Surgery

## 2013-06-15 ENCOUNTER — Ambulatory Visit (HOSPITAL_COMMUNITY)
Admission: RE | Admit: 2013-06-15 | Discharge: 2013-06-15 | Disposition: A | Discharge status: Home / Self Care | Payer: Medicare Other | Source: Ambulatory Visit | Attending: Vascular Surgery | Admitting: Vascular Surgery

## 2013-06-15 VITALS — BP 87/51 | HR 78 | Ht 66.0 in | Wt 190.0 lb

## 2013-06-15 DIAGNOSIS — Z01810 Encounter for preprocedural cardiovascular examination: Secondary | ICD-10-CM

## 2013-06-15 DIAGNOSIS — Z992 Dependence on renal dialysis: Secondary | ICD-10-CM

## 2013-06-15 DIAGNOSIS — N186 End stage renal disease: Secondary | ICD-10-CM

## 2013-06-15 NOTE — Progress Notes (Signed)
[facility] [place]  Patient name: [name] MRN: [id number] DOB: 07-23-1931 Sex: male  REASON FOR VISIT: Evaluate for new hemodialysis access.  HPI: [name] is a 80 y.o. male Who dialyzes on Tuesdays Thursdays and Saturdays. He has had forearm and upper arm grafts in both upper extremities. He currently dialyzes with a left IJ tunneled dialysis catheter. We have been asked to evaluate him for new access.   He denies any recent uremic symptoms. Specifically he denies nausea, vomiting, out the patient's, anorexia, or fatigue.   Past Medical History  Diagnosis Date  . Diabetes mellitus   . Hypertension   . Arthritis   . Cataract   . CAD (coronary artery disease)   . Secondary hyperthyroidism   . Dementia   . History of thrombocytopenia   . GERD (gastroesophageal reflux disease)   . Anxiety     paranoia  . Stroke   . Chronic kidney disease     Tue, Thurs, Sat dialysis, [facility] [address]  . Anemia    No family history on file. SOCIAL HISTORY: History  Substance Use Topics  . Smoking status: Never Smoker   . Smokeless tobacco: Never Used  . Alcohol Use: Yes     Comment: approx 30 years ago   No Known Allergies Current Outpatient Prescriptions  Medication Sig Dispense Refill  . acetaminophen (TYLENOL) 650 MG CR tablet Take 650 mg by mouth every 4 (four) hours as needed for pain or fever (and headache).      . Amino Acids-Protein Hydrolys (FEEDING SUPPLEMENT, PRO-STAT SUGAR FREE 64,) LIQD Take 30 mLs by mouth 2 (two) times daily.      Marland Kitchen. b complex-vitamin c-folic acid (NEPHRO-VITE) 0.8 MG TABS tablet Take 1 tablet by mouth at bedtime.      . bisacodyl (DULCOLAX) 10 MG suppository Place 10 mg rectally daily as needed (for no bowel movement).       . calcium acetate (PHOSLO) 667 MG capsule Take 667 mg by mouth 3 (three) times daily with meals.       . cinacalcet (SENSIPAR) 90 MG tablet Take 180 mg by mouth daily at 12 noon.       Marland Kitchen.  dextrose 5 G chewable tablet Chew 37.5 g by mouth as needed for low blood sugar. During dialysis PRN      . diphenhydrAMINE (BENADRYL) 25 MG tablet Take 25 mg by mouth as needed for itching.      . docusate sodium (COLACE) 100 MG capsule Take 200 mg by mouth at bedtime.       Marland Kitchen. escitalopram (LEXAPRO) 20 MG tablet Take 20 mg by mouth daily.      Marland Kitchen. ethyl chloride spray Apply 1 application topically See admin instructions. Apply to dialysis site prior to dialysis (tuesday, Thursday, Saturday)      . lanthanum (FOSRENOL) 1000 MG chewable tablet Chew 1 tablet (1,000 mg total) by mouth 2 (two) times daily with a meal.      . loperamide (IMODIUM) 2 MG capsule Take 2 mg by mouth as needed for diarrhea or loose stools.      Marland Kitchen. LORazepam (ATIVAN) 0.5 MG tablet Take 0.5 mg by mouth every 4 (four) hours as needed for anxiety.      . midodrine (PROAMATINE) 10 MG tablet Take 10 mg by mouth 3 (three) times daily.      . Multiple Vitamins-Minerals (MULTIVITAMIN PO) Take 1 tablet by mouth daily.      .Marland Kitchen  omeprazole (PRILOSEC) 20 MG capsule Take 20 mg by mouth daily.      Marland Kitchen. oxyCODONE (OXY IR/ROXICODONE) 5 MG immediate release tablet Take 1 tablet (5 mg total) by mouth 2 (two) times daily as needed for severe pain.  30 tablet  0  . risperiDONE (RISPERDAL) 0.25 MG tablet Take 0.25 mg by mouth at bedtime.       . simvastatin (ZOCOR) 20 MG tablet Take 20 mg by mouth at bedtime.        No current facility-administered medications for this visit.   REVIEW OF SYSTEMS: Arly.Keller[X ] denotes positive finding; [  ] denotes negative finding  CARDIOVASCULAR:  [ ]  chest pain   [ ]  chest pressure   [ ]  palpitations   [ ]  orthopnea   [ ]  dyspnea on exertion   [ ]  claudication   [ ]  rest pain   [ ]  DVT   [ ]  phlebitis PULMONARY:   [ ]  productive cough   [ ]  asthma   [ ]  wheezing NEUROLOGIC:   [ ]  weakness  [ ]  paresthesias  [ ]  aphasia  [ ]  amaurosis  [ ]  dizziness HEMATOLOGIC:   [ ]  bleeding problems   [ ]  clotting  disorders MUSCULOSKELETAL:  [ ]  joint pain   [ ]  joint swelling [ ]  leg swelling GASTROINTESTINAL: [ ]   blood in stool  [ ]   hematemesis GENITOURINARY:  [ ]   dysuria  [ ]   hematuria PSYCHIATRIC:  [ ]  history of major depression INTEGUMENTARY:  [ ]  rashes  [ ]  ulcers CONSTITUTIONAL:  [ ]  fever   [ ]  chills  PHYSICAL EXAM: Filed Vitals:   06/15/13 1549  BP: 87/51  Pulse: 78  Height: 5\' 6"  (1.676 m)  Weight: 190 lb (86.183 kg)  SpO2: 100%   Body mass index is 30.68 kg/(m^2). GENERAL: The patient is a well-nourished male, in no acute distress. The vital signs are documented above. CARDIOVASCULAR: There is a regular rate and rhythm. He has palpable radial pulses bilaterally. He has palpable femoral pulses. I cannot palpate pedal pulses. He has mild bilateral lower extremity swelling. PULMONARY: There is good air exchange bilaterally without wheezing or rales. ABDOMEN: Soft and non-tender with normal pitched bowel sounds.  MUSCULOSKELETAL: There are no major deformities or cyanosis. NEUROLOGIC: No focal weakness or paresthesias are detected. SKIN: There are no ulcers or rashes noted. PSYCHIATRIC: The patient has a normal affect.  DATA:  I have independently interpreted his vein mapping. He does not appear to have adequate cephalic vein or basilic vein on either side.  I've also independently interpreted his arterial Doppler study was upper cherry which showed biphasic Doppler signals in both upper extremities.  MEDICAL ISSUES:  End stage renal disease on dialysis I think the next best option for hemodialysis access would be a right thigh AV graft. I have discussed indications for the procedure and the potential complications including but not limited to the risk of graft thrombosis, graft infection, and steal symptoms. We would probably keep him overnight and have him dialyzed in the hospital the following day. We'll have to watch closely for steal as he does have evidence of some  infrainguinal arterial occlusive disease.   DICKSON,CHRISTOPHER S [facility] [place] Beeper: [phone]

## 2013-06-15 NOTE — Assessment & Plan Note (Signed)
I think the next best option for hemodialysis access would be a right thigh AV graft. I have discussed indications for the procedure and the potential complications including but not limited to the risk of graft thrombosis, graft infection, and steal symptoms. We would probably keep him overnight and have him dialyzed in the hospital the following day. We'll have to watch closely for steal as he does have evidence of some infrainguinal arterial occlusive disease.

## 2013-06-16 ENCOUNTER — Other Ambulatory Visit: Payer: Self-pay | Admitting: *Deleted

## 2013-06-20 ENCOUNTER — Encounter (HOSPITAL_COMMUNITY): Payer: Self-pay | Admitting: Pharmacy Technician

## 2013-06-20 ENCOUNTER — Ambulatory Visit (INDEPENDENT_AMBULATORY_CARE_PROVIDER_SITE_OTHER): Payer: Medicare Other | Admitting: Podiatry

## 2013-06-20 ENCOUNTER — Encounter: Payer: Self-pay | Admitting: Podiatry

## 2013-06-20 VITALS — BP 96/46 | HR 91 | Resp 18

## 2013-06-20 DIAGNOSIS — M79609 Pain in unspecified limb: Secondary | ICD-10-CM

## 2013-06-20 DIAGNOSIS — B351 Tinea unguium: Secondary | ICD-10-CM

## 2013-06-20 NOTE — Progress Notes (Signed)
° °  Subjective:    Patient ID: [name], male    DOB: 13-Nov-1931, 80 y.o.   MRN: [id number]  HPI I came to get my toenails cut. He was last seen on 04/20/2012 for dispensing of diabetic shoes.    Review of Systems     Objective:   Physical Exam Patient is seated in a wheelchair unable to transfer to treatment chair.  Dermatological: Nonpitting edema noted ankles bilaterally. Elongated, hypertrophic, discolored toenails x10       Assessment & Plan:  Assessment: Symptomatic onychomycoses x10  Plan nails x10 are debrided back without a bleeding. Reappoint at three-month intervals.

## 2013-06-24 ENCOUNTER — Non-Acute Institutional Stay (SKILLED_NURSING_FACILITY): Payer: Medicare Other | Admitting: Internal Medicine

## 2013-06-24 DIAGNOSIS — N186 End stage renal disease: Secondary | ICD-10-CM

## 2013-06-24 DIAGNOSIS — F0393 Unspecified dementia, unspecified severity, with mood disturbance: Secondary | ICD-10-CM | POA: Insufficient documentation

## 2013-06-24 DIAGNOSIS — F0280 Dementia in other diseases classified elsewhere without behavioral disturbance: Secondary | ICD-10-CM

## 2013-06-24 DIAGNOSIS — K219 Gastro-esophageal reflux disease without esophagitis: Secondary | ICD-10-CM

## 2013-06-24 DIAGNOSIS — F3289 Other specified depressive episodes: Secondary | ICD-10-CM

## 2013-06-24 DIAGNOSIS — Z992 Dependence on renal dialysis: Principal | ICD-10-CM

## 2013-06-24 DIAGNOSIS — I959 Hypotension, unspecified: Secondary | ICD-10-CM

## 2013-06-24 DIAGNOSIS — F329 Major depressive disorder, single episode, unspecified: Secondary | ICD-10-CM

## 2013-06-24 NOTE — Progress Notes (Signed)
Patient ID: [name], male   DOB: 18-Jun-1931, 80 y.o.   MRN: [id number]     [name] living [employer]  Chief Complaint  Patient presents with  . Medical Managment of Chronic Issues   No Known Allergies  HPI   80 y/o male patient is seen for routine follow up. He is in no distress, oriented to person and place and denies any complaints. No new concern from staff. Continues with dialysis. Has been feeling tired  Review of Systems   Constitutional: Negative for fever, chills, weight loss and diaphoresis.   HENT: Negative for congestion, hearing loss and sore throat.    Eyes: Negative for blurred vision, double vision and discharge.   Respiratory: Negative for cough, sputum production, shortness of breath and wheezing.    Cardiovascular: Negative for chest pain, palpitations, orthopnea and leg swelling.   Gastrointestinal: Negative for heartburn, nausea, vomiting, abdominal pain, diarrhea and constipation.   Genitourinary: Negative for dysuria, urgency, frequency and flank pain.   Musculoskeletal: Negative for back pain, falls, joint pain and myalgias.   Skin: Negative for itching and rash.   Neurological: Negative for dizziness, tingling, focal weakness and headaches.   Psychiatric/Behavioral: Negative for depression. Has memory loss. The patient is not nervous/anxious  Past Medical History  Diagnosis Date  . Diabetes mellitus   . Hypertension   . Arthritis   . Cataract   . CAD (coronary artery disease)   . Secondary hyperthyroidism   . Dementia   . History of thrombocytopenia   . GERD (gastroesophageal reflux disease)   . Anxiety     paranoia  . Stroke   . Chronic kidney disease     Tue, Thurs, Sat dialysis, goes to [address]  . Anemia    Past Surgical History  Procedure Laterality Date  . Arteriovenous graft placement  05/01/10    Left upper arm brachial artery to axillary vein AVG  . Thrombectomy / arteriovenous graft revision  11/07/10    Left upper arm AVG   by Dr. Imogene Burn  . Diatek catheter insertion  12/05/10    Left side  . Bone spur      foot surgery  . Av fistula placement  04/14/2011    Procedure: ARTERIOVENOUS (AV) FISTULA CREATION;  Surgeon: Nilda Simmer, MD;  Location: [facility] OR;  Service: Vascular;  Laterality: Right;   Second  stage basilic vein transposition  . Knee surgery Left   . Foot surgery Left   . Bunionectomy    . Ligation of arteriovenous  fistula Right 04/26/2013    Procedure: LIGATION OF ARTERIOVENOUS  FISTULA;  Surgeon: Sherren Kerns, MD;  Location: [facility] OR;  Service: Vascular;  Laterality: Right;  . Insertion of dialysis catheter Left 04/26/2013    Procedure: INSERTION OF DIALYSIS CATHETER;  Surgeon: Sherren Kerns, MD;  Location: [facility] OR;  Service: Vascular;  Laterality: Left;  . Exchange of a dialysis catheter Left 05/03/2013    Procedure: EXCHANGE OF A DIALYSIS CATHETER;  Surgeon: Sherren Kerns, MD;  Location: [facility] OR;  Service: Vascular;  Laterality: Left;   Medication reviewed. See [facility]  Physical exam BP 110/63  Pulse 78  Temp(Src) 98 F (36.7 C)  Resp 18  Wt 182 lb (82.555 kg)  SpO2 94%  Constitutional: He appears well-developed and well-nourished.  overweight  Neck: Neck supple. No JVD present. No thyromegaly present.   Cardiovascular: Normal rate and regular rhythm.    Respiratory: Effort normal. decreased basilar air entry, no crackles  or rhonchi appreciated. No respiratory distress. He has no wheezes.   GI: Soft. Bowel sounds are normal. He exhibits no distension. There is no tenderness.  Musculoskeletal: Normal range of motion. New dialysis catheter site clean on left chest. Edema in arm resolved. Edema trace present in legs Neurological: He is alert.   Skin: Skin is warm and dry Psychiatric: He has a normal mood and affect.    Assessment/plan  End stage renal disease on dialysis Continue dialysis 3/week and renal supplement. Plan is for vascular to have a right thigh AV graft. Continue  statin  Hypotension bp well controlled. On midodrine for now, monitor clinically  Depression continue lexapro 20 mg daily and ativan 0.5 mg every 4 hours as needed for anxiety. Also continue his risperdal 0.25 nightly and 0.25 mg on dialysis and will monitor his status  GERD   Will continue prilosec 20 mg daily

## 2013-06-30 MED ORDER — CEFUROXIME SODIUM 1.5 G IJ SOLR
1.5000 g | INTRAMUSCULAR | Status: AC
  Administered 2013-07-01: 1.5 g via INTRAVENOUS
  Filled 2013-06-30: qty 1.5

## 2013-06-30 NOTE — Progress Notes (Signed)
[name] , son signs consents for patient, phone consent was obtained.

## 2013-06-30 NOTE — Progress Notes (Signed)
Mr [name] is a resident of [facility].  I spoke to patient nurse who said patient is alert, but confused most of the time, she reported that patient's son usually sign's patient's consents. I called son, [name], but unable to reach him at this time will try again.

## 2013-07-01 ENCOUNTER — Observation Stay (HOSPITAL_COMMUNITY)
Admission: RE | Admit: 2013-07-01 | Discharge: 2013-07-02 | Disposition: A | Discharge status: Skilled Nursing Facility | Payer: Medicare Other | Source: Ambulatory Visit | Attending: Vascular Surgery | Admitting: Vascular Surgery

## 2013-07-01 ENCOUNTER — Encounter (HOSPITAL_COMMUNITY): Payer: Self-pay | Admitting: *Deleted

## 2013-07-01 ENCOUNTER — Inpatient Hospital Stay (HOSPITAL_COMMUNITY): Payer: Medicare Other | Admitting: Anesthesiology

## 2013-07-01 ENCOUNTER — Encounter (HOSPITAL_COMMUNITY)
Admission: RE | Disposition: A | Discharge status: Skilled Nursing Facility | Payer: Self-pay | Source: Ambulatory Visit | Attending: Vascular Surgery

## 2013-07-01 ENCOUNTER — Encounter (HOSPITAL_COMMUNITY): Payer: Medicare Other | Admitting: Anesthesiology

## 2013-07-01 DIAGNOSIS — I739 Peripheral vascular disease, unspecified: Secondary | ICD-10-CM | POA: Insufficient documentation

## 2013-07-01 DIAGNOSIS — E782 Mixed hyperlipidemia: Secondary | ICD-10-CM | POA: Insufficient documentation

## 2013-07-01 DIAGNOSIS — E119 Type 2 diabetes mellitus without complications: Secondary | ICD-10-CM | POA: Insufficient documentation

## 2013-07-01 DIAGNOSIS — F22 Delusional disorders: Secondary | ICD-10-CM | POA: Insufficient documentation

## 2013-07-01 DIAGNOSIS — M899 Disorder of bone, unspecified: Secondary | ICD-10-CM | POA: Insufficient documentation

## 2013-07-01 DIAGNOSIS — Z992 Dependence on renal dialysis: Secondary | ICD-10-CM | POA: Insufficient documentation

## 2013-07-01 DIAGNOSIS — N186 End stage renal disease: Secondary | ICD-10-CM | POA: Diagnosis present

## 2013-07-01 DIAGNOSIS — I120 Hypertensive chronic kidney disease with stage 5 chronic kidney disease or end stage renal disease: Principal | ICD-10-CM | POA: Insufficient documentation

## 2013-07-01 DIAGNOSIS — K219 Gastro-esophageal reflux disease without esophagitis: Secondary | ICD-10-CM | POA: Insufficient documentation

## 2013-07-01 DIAGNOSIS — I2510 Atherosclerotic heart disease of native coronary artery without angina pectoris: Secondary | ICD-10-CM | POA: Insufficient documentation

## 2013-07-01 DIAGNOSIS — I9589 Other hypotension: Secondary | ICD-10-CM | POA: Insufficient documentation

## 2013-07-01 DIAGNOSIS — F329 Major depressive disorder, single episode, unspecified: Secondary | ICD-10-CM | POA: Insufficient documentation

## 2013-07-01 DIAGNOSIS — M949 Disorder of cartilage, unspecified: Secondary | ICD-10-CM

## 2013-07-01 DIAGNOSIS — Z8673 Personal history of transient ischemic attack (TIA), and cerebral infarction without residual deficits: Secondary | ICD-10-CM | POA: Insufficient documentation

## 2013-07-01 DIAGNOSIS — F0390 Unspecified dementia without behavioral disturbance: Secondary | ICD-10-CM | POA: Insufficient documentation

## 2013-07-01 DIAGNOSIS — N2581 Secondary hyperparathyroidism of renal origin: Secondary | ICD-10-CM | POA: Insufficient documentation

## 2013-07-01 DIAGNOSIS — F411 Generalized anxiety disorder: Secondary | ICD-10-CM | POA: Insufficient documentation

## 2013-07-01 DIAGNOSIS — F3289 Other specified depressive episodes: Secondary | ICD-10-CM | POA: Insufficient documentation

## 2013-07-01 DIAGNOSIS — T82898A Other specified complication of vascular prosthetic devices, implants and grafts, initial encounter: Secondary | ICD-10-CM

## 2013-07-01 DIAGNOSIS — D649 Anemia, unspecified: Secondary | ICD-10-CM | POA: Insufficient documentation

## 2013-07-01 DIAGNOSIS — M129 Arthropathy, unspecified: Secondary | ICD-10-CM | POA: Insufficient documentation

## 2013-07-01 HISTORY — DX: Unspecified cataract: H26.9

## 2013-07-01 HISTORY — DX: Delusional disorders: F22

## 2013-07-01 HISTORY — DX: Hypotension, unspecified: I95.9

## 2013-07-01 HISTORY — DX: Unspecified psychosis not due to a substance or known physiological condition: F29

## 2013-07-01 HISTORY — DX: Mixed hyperlipidemia: E78.2

## 2013-07-01 HISTORY — DX: End stage renal disease: N18.6

## 2013-07-01 HISTORY — PX: AV FISTULA PLACEMENT: SHX1204

## 2013-07-01 LAB — COMPREHENSIVE METABOLIC PANEL
ALBUMIN: 2.7 g/dL — AB (ref 3.5–5.2)
ALK PHOS: 90 U/L (ref 39–117)
ALT: 7 U/L (ref 0–53)
AST: 14 U/L (ref 0–37)
BILIRUBIN TOTAL: 0.4 mg/dL (ref 0.3–1.2)
BUN: 22 mg/dL (ref 6–23)
CHLORIDE: 101 meq/L (ref 96–112)
CO2: 26 mEq/L (ref 19–32)
Calcium: 11.3 mg/dL — ABNORMAL HIGH (ref 8.4–10.5)
Creatinine, Ser: 5.12 mg/dL — ABNORMAL HIGH (ref 0.50–1.35)
GFR calc Af Amer: 11 mL/min — ABNORMAL LOW (ref >90)
GFR calc non Af Amer: 10 mL/min — ABNORMAL LOW (ref >90)
Glucose, Bld: 86 mg/dL (ref 70–99)
Potassium: 3.8 mEq/L (ref 3.7–5.3)
SODIUM: 143 meq/L (ref 137–147)
Total Protein: 6.2 g/dL (ref 6.0–8.3)

## 2013-07-01 LAB — GLUCOSE, CAPILLARY
GLUCOSE-CAPILLARY: 78 mg/dL (ref 70–99)
GLUCOSE-CAPILLARY: 83 mg/dL (ref 70–99)
Glucose-Capillary: 99 mg/dL (ref 70–99)
Glucose-Capillary: 88 mg/dL (ref 70–99)

## 2013-07-01 LAB — CBC
HEMATOCRIT: 40.2 % (ref 39.0–52.0)
Hemoglobin: 12.7 g/dL — ABNORMAL LOW (ref 13.0–17.0)
MCH: 29.1 pg (ref 26.0–34.0)
MCHC: 31.6 g/dL (ref 30.0–36.0)
MCV: 92 fL (ref 78.0–100.0)
PLATELETS: 69 10*3/uL — AB (ref 150–400)
RBC: 4.37 MIL/uL (ref 4.22–5.81)
RDW: 15.5 % (ref 11.5–15.5)
WBC: 7.3 10*3/uL (ref 4.0–10.5)

## 2013-07-01 LAB — PROTIME-INR
INR: 1.09 (ref 0.00–1.49)
Prothrombin Time: 13.9 seconds (ref 11.6–15.2)

## 2013-07-01 LAB — POCT I-STAT 4, (NA,K, GLUC, HGB,HCT)
GLUCOSE: 82 mg/dL (ref 70–99)
HCT: 44 % (ref 39.0–52.0)
Hemoglobin: 15 g/dL (ref 13.0–17.0)
POTASSIUM: 4.5 meq/L (ref 3.7–5.3)
SODIUM: 140 meq/L (ref 137–147)

## 2013-07-01 LAB — MRSA PCR SCREENING: MRSA by PCR: NEGATIVE

## 2013-07-01 SURGERY — INSERTION OF ARTERIOVENOUS (AV) GORE-TEX GRAFT THIGH
Anesthesia: General | Laterality: Right

## 2013-07-01 MED ORDER — RISPERIDONE 0.5 MG PO TABS
0.5000 mg | ORAL_TABLET | Freq: Every day | ORAL | Status: DC
  Filled 2013-07-01 (×2): qty 1

## 2013-07-01 MED ORDER — DOCUSATE SODIUM 100 MG PO CAPS
200.0000 mg | ORAL_CAPSULE | Freq: Every day | ORAL | Status: DC
  Filled 2013-07-01 (×2): qty 2

## 2013-07-01 MED ORDER — ENOXAPARIN SODIUM 30 MG/0.3ML ~~LOC~~ SOLN
30.0000 mg | SUBCUTANEOUS | Status: DC
  Filled 2013-07-01 (×2): qty 0.3

## 2013-07-01 MED ORDER — LORAZEPAM 0.5 MG PO TABS: 0.5000 mg | ORAL_TABLET | ORAL | Status: DC | PRN

## 2013-07-01 MED ORDER — MIDAZOLAM HCL 2 MG/2ML IJ SOLN
INTRAMUSCULAR | Status: AC
  Filled 2013-07-01: qty 2

## 2013-07-01 MED ORDER — PHENYLEPHRINE HCL 10 MG/ML IJ SOLN
INTRAMUSCULAR | Status: DC | PRN
  Administered 2013-07-01: 80 ug via INTRAVENOUS
  Administered 2013-07-01 (×3): 40 ug via INTRAVENOUS

## 2013-07-01 MED ORDER — LIDOCAINE HCL (CARDIAC) 20 MG/ML IV SOLN
INTRAVENOUS | Status: AC
  Filled 2013-07-01: qty 5

## 2013-07-01 MED ORDER — PROMETHAZINE HCL 25 MG/ML IJ SOLN: 6.2500 mg | INTRAMUSCULAR | Status: DC | PRN

## 2013-07-01 MED ORDER — LOPERAMIDE HCL 2 MG PO CAPS
2.0000 mg | ORAL_CAPSULE | ORAL | Status: DC | PRN
  Filled 2013-07-01: qty 1

## 2013-07-01 MED ORDER — GUAIFENESIN-DM 100-10 MG/5ML PO SYRP
15.0000 mL | ORAL_SOLUTION | ORAL | Status: DC | PRN
  Filled 2013-07-01: qty 15

## 2013-07-01 MED ORDER — OXYCODONE HCL 5 MG PO TABS: 5.0000 mg | ORAL_TABLET | Freq: Two times a day (BID) | ORAL | Status: DC | PRN

## 2013-07-01 MED ORDER — FENTANYL CITRATE 0.05 MG/ML IJ SOLN
INTRAMUSCULAR | Status: DC | PRN
  Administered 2013-07-01: 25 ug via INTRAVENOUS
  Administered 2013-07-01: 50 ug via INTRAVENOUS
  Administered 2013-07-01: 25 ug via INTRAVENOUS

## 2013-07-01 MED ORDER — ACETAMINOPHEN ER 650 MG PO TBCR: 650.0000 mg | EXTENDED_RELEASE_TABLET | ORAL | Status: DC | PRN

## 2013-07-01 MED ORDER — PROTAMINE SULFATE 10 MG/ML IV SOLN
INTRAVENOUS | Status: AC
  Filled 2013-07-01: qty 5

## 2013-07-01 MED ORDER — ACETAMINOPHEN 325 MG PO TABS: 650.0000 mg | ORAL_TABLET | ORAL | Status: DC | PRN

## 2013-07-01 MED ORDER — HEPARIN SODIUM (PORCINE) 1000 UNIT/ML IJ SOLN
INTRAMUSCULAR | Status: DC | PRN
  Administered 2013-07-01: 7000 [IU] via INTRAVENOUS

## 2013-07-01 MED ORDER — OXYCODONE-ACETAMINOPHEN 5-325 MG PO TABS: 1.0000 | ORAL_TABLET | ORAL | Status: DC | PRN

## 2013-07-01 MED ORDER — PROPOFOL 10 MG/ML IV BOLUS
INTRAVENOUS | Status: DC | PRN
  Administered 2013-07-01: 140 mg via INTRAVENOUS

## 2013-07-01 MED ORDER — OXYCODONE HCL 5 MG PO TABS: 5.0000 mg | ORAL_TABLET | Freq: Once | ORAL | Status: DC | PRN

## 2013-07-01 MED ORDER — SIMVASTATIN 20 MG PO TABS
20.0000 mg | ORAL_TABLET | Freq: Every day | ORAL | Status: DC
  Filled 2013-07-01 (×2): qty 1

## 2013-07-01 MED ORDER — LABETALOL HCL 5 MG/ML IV SOLN
10.0000 mg | INTRAVENOUS | Status: DC | PRN
  Filled 2013-07-01: qty 4

## 2013-07-01 MED ORDER — HYDRALAZINE HCL 20 MG/ML IJ SOLN: 10.0000 mg | INTRAMUSCULAR | Status: DC | PRN

## 2013-07-01 MED ORDER — THROMBIN 20000 UNITS EX SOLR
CUTANEOUS | Status: DC | PRN
  Administered 2013-07-01: 09:00:00 via TOPICAL

## 2013-07-01 MED ORDER — PANTOPRAZOLE SODIUM 40 MG PO TBEC
40.0000 mg | DELAYED_RELEASE_TABLET | Freq: Every day | ORAL | Status: DC
  Administered 2013-07-02: 40 mg via ORAL
  Filled 2013-07-01: qty 1

## 2013-07-01 MED ORDER — PHENOL 1.4 % MT LIQD
1.0000 | OROMUCOSAL | Status: DC | PRN
  Filled 2013-07-01: qty 177

## 2013-07-01 MED ORDER — SODIUM CHLORIDE 0.9 % IV SOLN
INTRAVENOUS | Status: DC
  Administered 2013-07-01: 08:00:00 via INTRAVENOUS

## 2013-07-01 MED ORDER — POTASSIUM CHLORIDE CRYS ER 20 MEQ PO TBCR
20.0000 meq | EXTENDED_RELEASE_TABLET | Freq: Once | ORAL | Status: DC
  Filled 2013-07-01: qty 1

## 2013-07-01 MED ORDER — PHENYLEPHRINE HCL 10 MG/ML IJ SOLN
INTRAMUSCULAR | Status: AC
  Filled 2013-07-01: qty 1

## 2013-07-01 MED ORDER — HYDROMORPHONE HCL PF 1 MG/ML IJ SOLN: 0.2500 mg | INTRAMUSCULAR | Status: DC | PRN

## 2013-07-01 MED ORDER — FENTANYL CITRATE 0.05 MG/ML IJ SOLN
INTRAMUSCULAR | Status: AC
  Filled 2013-07-01: qty 5

## 2013-07-01 MED ORDER — ONDANSETRON HCL 4 MG/2ML IJ SOLN: 4.0000 mg | Freq: Four times a day (QID) | INTRAMUSCULAR | Status: DC | PRN

## 2013-07-01 MED ORDER — CALCIUM ACETATE 667 MG PO CAPS
667.0000 mg | ORAL_CAPSULE | Freq: Three times a day (TID) | ORAL | Status: DC
  Filled 2013-07-01 (×6): qty 1

## 2013-07-01 MED ORDER — ESCITALOPRAM OXALATE 20 MG PO TABS
20.0000 mg | ORAL_TABLET | Freq: Every day | ORAL | Status: DC
  Administered 2013-07-02: 20 mg via ORAL
  Filled 2013-07-01 (×3): qty 1

## 2013-07-01 MED ORDER — OXYCODONE HCL 5 MG PO TABS: 5.0000 mg | ORAL_TABLET | Freq: Four times a day (QID) | ORAL | Status: DC | PRN

## 2013-07-01 MED ORDER — MIDODRINE HCL 5 MG PO TABS
10.0000 mg | ORAL_TABLET | Freq: Three times a day (TID) | ORAL | Status: DC
  Administered 2013-07-02: 5 mg via ORAL
  Filled 2013-07-01 (×5): qty 2

## 2013-07-01 MED ORDER — METOPROLOL TARTRATE 1 MG/ML IV SOLN: 2.0000 mg | INTRAVENOUS | Status: DC | PRN

## 2013-07-01 MED ORDER — OXYCODONE HCL 5 MG/5ML PO SOLN
5.0000 mg | Freq: Once | ORAL | Status: DC | PRN
Start: 2013-07-01 — End: 2013-07-01

## 2013-07-01 MED ORDER — PROPOFOL 10 MG/ML IV BOLUS
INTRAVENOUS | Status: AC
  Filled 2013-07-01: qty 20

## 2013-07-01 MED ORDER — THROMBIN 20000 UNITS EX SOLR
CUTANEOUS | Status: AC
  Filled 2013-07-01: qty 20000

## 2013-07-01 MED ORDER — SODIUM CHLORIDE 0.9 % IR SOLN
Status: DC | PRN
  Administered 2013-07-01: 08:00:00

## 2013-07-01 MED ORDER — PHENYLEPHRINE HCL 10 MG/ML IJ SOLN
10.0000 mg | INTRAVENOUS | Status: DC | PRN
  Administered 2013-07-01: 10 ug/min via INTRAVENOUS

## 2013-07-01 MED ORDER — PRO-STAT SUGAR FREE PO LIQD
30.0000 mL | Freq: Two times a day (BID) | ORAL | Status: DC
  Filled 2013-07-01 (×3): qty 30

## 2013-07-01 MED ORDER — EPHEDRINE SULFATE 50 MG/ML IJ SOLN
INTRAMUSCULAR | Status: DC | PRN
  Administered 2013-07-01: 10 mg via INTRAVENOUS

## 2013-07-01 MED ORDER — BIOTENE DRY MOUTH MT LIQD: 15.0000 mL | Freq: Two times a day (BID) | OROMUCOSAL | Status: DC

## 2013-07-01 MED ORDER — LIDOCAINE HCL (CARDIAC) 20 MG/ML IV SOLN
INTRAVENOUS | Status: DC | PRN
  Administered 2013-07-01: 40 mg via INTRAVENOUS

## 2013-07-01 MED ORDER — PROTAMINE SULFATE 10 MG/ML IV SOLN
INTRAVENOUS | Status: DC | PRN
  Administered 2013-07-01: 30 mg via INTRAVENOUS

## 2013-07-01 MED ORDER — CINACALCET HCL 30 MG PO TABS
180.0000 mg | ORAL_TABLET | Freq: Every day | ORAL | Status: DC
  Filled 2013-07-01 (×2): qty 6

## 2013-07-01 MED ORDER — BISACODYL 10 MG RE SUPP: 10.0000 mg | Freq: Every day | RECTAL | Status: DC | PRN

## 2013-07-01 MED ORDER — HYDROMORPHONE HCL PF 1 MG/ML IJ SOLN
0.5000 mg | INTRAMUSCULAR | Status: DC | PRN
Start: 2013-07-01 — End: 2013-07-02

## 2013-07-01 MED ORDER — LANTHANUM CARBONATE 500 MG PO CHEW
1000.0000 mg | CHEWABLE_TABLET | Freq: Two times a day (BID) | ORAL | Status: DC
  Administered 2013-07-02: 1000 mg via ORAL
  Filled 2013-07-01 (×4): qty 2

## 2013-07-01 MED ORDER — DIPHENHYDRAMINE HCL 25 MG PO TABS
25.0000 mg | ORAL_TABLET | Freq: Three times a day (TID) | ORAL | Status: DC | PRN
  Filled 2013-07-01: qty 1

## 2013-07-01 MED ORDER — ALUM & MAG HYDROXIDE-SIMETH 200-200-20 MG/5ML PO SUSP: 15.0000 mL | ORAL | Status: DC | PRN

## 2013-07-01 MED ORDER — RENA-VITE PO TABS
1.0000 | ORAL_TABLET | Freq: Every day | ORAL | Status: DC
  Filled 2013-07-01 (×2): qty 1

## 2013-07-01 MED ORDER — 0.9 % SODIUM CHLORIDE (POUR BTL) OPTIME
TOPICAL | Status: DC | PRN
  Administered 2013-07-01: 1000 mL

## 2013-07-01 SURGICAL SUPPLY — 40 items
ADH SKN CLS APL DERMABOND .7 (GAUZE/BANDAGES/DRESSINGS) ×1
CANISTER SUCTION 2500CC (MISCELLANEOUS) ×3 IMPLANT
CLIP TI MEDIUM 6 (CLIP) ×3 IMPLANT
CLIP TI WIDE RED SMALL 24 (CLIP) ×2 IMPLANT
CLIP TI WIDE RED SMALL 6 (CLIP) ×3 IMPLANT
COVER SURGICAL LIGHT HANDLE (MISCELLANEOUS) ×3 IMPLANT
DERMABOND ADVANCED (GAUZE/BANDAGES/DRESSINGS) ×2
DERMABOND ADVANCED .7 DNX12 (GAUZE/BANDAGES/DRESSINGS) ×1 IMPLANT
DRAPE INCISE IOBAN 66X45 STRL (DRAPES) ×4 IMPLANT
ELECT REM PT RETURN 9FT ADLT (ELECTROSURGICAL) ×3
ELECTRODE REM PT RTRN 9FT ADLT (ELECTROSURGICAL) ×1 IMPLANT
GLOVE BIO SURGEON STRL SZ 6.5 (GLOVE) ×6 IMPLANT
GLOVE BIO SURGEON STRL SZ7.5 (GLOVE) ×3 IMPLANT
GLOVE BIO SURGEONS STRL SZ 6.5 (GLOVE) ×3
GLOVE BIOGEL PI IND STRL 6.5 (GLOVE) IMPLANT
GLOVE BIOGEL PI IND STRL 7.0 (GLOVE) IMPLANT
GLOVE BIOGEL PI IND STRL 8 (GLOVE) ×1 IMPLANT
GLOVE BIOGEL PI INDICATOR 6.5 (GLOVE) ×8
GLOVE BIOGEL PI INDICATOR 7.0 (GLOVE) ×2
GLOVE BIOGEL PI INDICATOR 8 (GLOVE) ×2
GLOVE ECLIPSE 6.5 STRL STRAW (GLOVE) ×2 IMPLANT
GOWN STRL REUS W/ TWL LRG LVL3 (GOWN DISPOSABLE) ×5 IMPLANT
GOWN STRL REUS W/TWL LRG LVL3 (GOWN DISPOSABLE) ×15
GRAFT GORETEX STRT 4-7X45 (Vascular Products) ×2 IMPLANT
KIT BASIN OR (CUSTOM PROCEDURE TRAY) ×3 IMPLANT
KIT ROOM TURNOVER OR (KITS) ×3 IMPLANT
LOOP VESSEL MAXI BLUE (MISCELLANEOUS) ×2 IMPLANT
NS IRRIG 1000ML POUR BTL (IV SOLUTION) ×3 IMPLANT
PACK CV ACCESS (CUSTOM PROCEDURE TRAY) ×3 IMPLANT
PAD ARMBOARD 7.5X6 YLW CONV (MISCELLANEOUS) ×6 IMPLANT
SPONGE SURGIFOAM ABS GEL 100 (HEMOSTASIS) ×3 IMPLANT
SUT PROLENE 6 0 BV (SUTURE) ×6 IMPLANT
SUT VIC AB 2-0 CTB1 (SUTURE) ×3 IMPLANT
SUT VIC AB 3-0 SH 27 (SUTURE) ×6
SUT VIC AB 3-0 SH 27X BRD (SUTURE) ×2 IMPLANT
SUT VICRYL 4-0 PS2 18IN ABS (SUTURE) ×6 IMPLANT
TOWEL OR 17X24 6PK STRL BLUE (TOWEL DISPOSABLE) ×5 IMPLANT
TOWEL OR 17X26 10 PK STRL BLUE (TOWEL DISPOSABLE) ×3 IMPLANT
UNDERPAD 30X30 INCONTINENT (UNDERPADS AND DIAPERS) ×3 IMPLANT
WATER STERILE IRR 1000ML POUR (IV SOLUTION) ×3 IMPLANT

## 2013-07-01 NOTE — Progress Notes (Signed)
Admission note:  Arrival Method: from PACU via stretcher Mental Orientation: Arousable and oriented to self only Telemetry: Yes, box 25 applied Assessment: See docflowsheets Skin: Two incisions to right thigh, scabs to right lower leg, excoriated bottom IV: L hand SL Pain: No C/O pain Tubes: N/A Safety Measures: Bed alarm on. Pt too lethargic to be educated about safety at this time. Admission: Completed and admission orders have been written  6E Orientation: Patient has been oriented to the unit, staff and to the room.  Family: None at the bedside   Unable to do admission history at this time, as no family is at bedside, and pt is not alert and oriented.

## 2013-07-01 NOTE — Progress Notes (Signed)
   VASCULAR PROGRESS NOTE  SUBJECTIVE: Denies pain  PHYSICAL EXAM: Filed Vitals:   07/01/13 1030 07/01/13 1045 07/01/13 1100 07/01/13 1153  BP: 103/69 106/42 113/46 122/50  Pulse: 78 78 85 83  Temp:   98.1 F (36.7 C) 98.1 F (36.7 C)  TempSrc:      Resp: 16 16 15 12   SpO2: 99% 100% 100% 100%   Good thrill in right thigh AVG Right foot pink and appears adequately perfused. I was able to obtain a DP signal post op in the PACU.  The doppler on 6E is currently not working, but foot looks adequately perfused and he denies pain.   LABS: Lab Results  Component Value Date   WBC 6.1 05/04/2013   HGB 15.0 07/01/2013   HCT 44.0 07/01/2013   MCV 93.2 05/04/2013   PLT 83* 05/04/2013   Lab Results  Component Value Date   CREATININE 6.56* 05/05/2013   Lab Results  Component Value Date   INR 1.28 05/03/2013   CBG (last 3)   Recent Labs  07/01/13 1003 07/01/13 1151  GLUCAP 99 88    Active Problems:   ESRD (end stage renal disease) on dialysis   ASSESSMENT AND PLAN:  * Doing well post op  * D/C after HD tomorrow. I have spoken with renal.    Cari CarawayChris Ardit Danh Beeper: [phone]: [phone] 07/01/2013

## 2013-07-01 NOTE — Anesthesia Procedure Notes (Signed)
Procedure Name: LMA Insertion Date/Time: 07/01/2013 8:01 AM Performed by: Brien MatesMAHONY, Christoph Copelan D Pre-anesthesia Checklist: Patient identified, Emergency Drugs available, Suction available, Patient being monitored and Timeout performed Patient Re-evaluated:Patient Re-evaluated prior to inductionOxygen Delivery Method: Circle system utilized Intubation Type: IV induction Ventilation: Mask ventilation without difficulty LMA: LMA inserted LMA Size: 4.0 Number of attempts: 1 Placement Confirmation: ETT inserted through vocal cords under direct vision,  positive ETCO2 and breath sounds checked- equal and bilateral Tube secured with: Tape Dental Injury: Teeth and Oropharynx as per pre-operative assessment

## 2013-07-01 NOTE — Progress Notes (Signed)
Patient confused as to last name able to give correct birth day. Patient does not have NH armband on. [facility] [place] [facility] [facility] [address] who states they will come and verify patient ID. Requested that they bring current MAR.

## 2013-07-01 NOTE — Consult Note (Signed)
[facility] Renal Consultation Note  Indication for Consultation:  Management of ESRD/hemodialysis; anemia, hypertension/volume and secondary hyperparathyroidism  HPI: [name] is a 80 y.o. male resident of [facility] facility with a history of dementia, chronic hypotension on Midodrine, and ESRD on [facility] who had scheduled placement of a right thigh AV graft today by Dr. Edilia Boickson of [facility].  He was seen post-surgery and remains very somnolent secondary to sedation, but will stay overnight and require his scheduled dialysis tomorrow morning.  Dialysis Orders:  TTS @ [facility] 4 hrs     400/800      81.5 kg       2K/2Ca       Heparin 2600 U      L IJ catheter Calcitriol 1 mcg PO at dialysis on TTS          Epogen 0        Venofer 0  Past Medical History  Diagnosis Date  . Diabetes mellitus   . Hypertension   . Arthritis   . Cataract   . CAD (coronary artery disease)   . Secondary hyperthyroidism   . Dementia   . History of thrombocytopenia   . GERD (gastroesophageal reflux disease)   . Anxiety     paranoia  . Stroke   . Chronic kidney disease     Tue, Thurs, Sat dialysis[facility]  . Anemia   . Psychosis   . Hypotension   . Cataract   . Delusional disorder   . Hyperlipidemia, mixed   . ESRD (end stage renal disease)    Past Surgical History  Procedure Laterality Date  . Arteriovenous graft placement  05/01/10    Left upper arm brachial artery to axillary vein AVG  . Thrombectomy / arteriovenous graft revision  11/07/10    Left upper arm AVG  by Dr. Imogene Burnhen  . Diatek catheter insertion  12/05/10    Left side  . Bone spur      foot surgery  . Av fistula placement  04/14/2011    Procedure: ARTERIOVENOUS (AV) FISTULA CREATION;  Surgeon: Nilda SimmerBrian Liang-Yu Chen, MD;  [facility];  Service: Vascular;  Laterality: Right;   Second  stage basilic vein transposition  . Knee surgery Left   . Foot  surgery Left   . Bunionectomy    . Ligation of arteriovenous  fistula Right 04/26/2013    Procedure: LIGATION OF ARTERIOVENOUS  FISTULA;  Surgeon: Sherren Kernsharles E Fields, MD;  [facility];  Service: Vascular;  Laterality: Right;  . Insertion of dialysis catheter Left 04/26/2013    Procedure: INSERTION OF DIALYSIS CATHETER;  Surgeon: Sherren Kernsharles E Fields, MD;  [facility];  Service: Vascular;  Laterality: Left;  . Exchange of a dialysis catheter Left 05/03/2013    Procedure: EXCHANGE OF A DIALYSIS CATHETER;  Surgeon: Sherren Kernsharles E Fields, MD;  [facility];  Service: Vascular;  Laterality: Left;   History reviewed. No pertinent family history.  Social History He denies any history of tobacco, alcohol, or illicit drug use.  No Known Allergies Prior to Admission medications   Medication Sig Start Date End Date Taking? Authorizing Provider  acetaminophen (TYLENOL) 650 MG CR tablet Take 650 mg by mouth every 4 (four) hours as needed for pain or fever (and headache).   Yes Historical Provider, MD  Amino Acids-Protein Hydrolys (FEEDING SUPPLEMENT, PRO-STAT SUGAR FREE 64,) LIQD Take 30 mLs by mouth 2 (  two) times daily.   Yes Historical Provider, MD  b complex-vitamin c-folic acid (NEPHRO-VITE) 0.8 MG TABS tablet Take 1 tablet by mouth at bedtime.   Yes Historical Provider, MD  bisacodyl (DULCOLAX) 10 MG suppository Place 10 mg rectally daily as needed (for no bowel movement).    Yes Historical Provider, MD  calcium acetate (PHOSLO) 667 MG capsule Take 667 mg by mouth 3 (three) times daily with meals.    Yes Historical Provider, MD  cinacalcet (SENSIPAR) 90 MG tablet Take 180 mg by mouth daily at 12 noon.    Yes Historical Provider, MD  diphenhydrAMINE (BENADRYL) 25 MG tablet Take 25 mg by mouth every 8 (eight) hours as needed for itching.    Yes Historical Provider, MD  docusate sodium (COLACE) 100 MG capsule Take 200 mg by mouth at bedtime.    Yes Historical Provider, MD  escitalopram (LEXAPRO) 20 MG  tablet Take 20 mg by mouth daily.   Yes Historical Provider, MD  lanthanum (FOSRENOL) 1000 MG chewable tablet Chew 1 tablet (1,000 mg total) by mouth 2 (two) times daily with a meal. 05/05/13  Yes Maryann Mikhail, DO  loperamide (IMODIUM) 2 MG capsule Take 2 mg by mouth as needed for diarrhea or loose stools.   Yes Historical Provider, MD  LORazepam (ATIVAN) 0.5 MG tablet Take 0.5 mg by mouth every 4 (four) hours as needed for anxiety.   Yes Historical Provider, MD  midodrine (PROAMATINE) 10 MG tablet Take 10 mg by mouth 3 (three) times daily.   Yes Historical Provider, MD  omeprazole (PRILOSEC) 20 MG capsule Take 20 mg by mouth daily.   Yes Historical Provider, MD  risperiDONE (RISPERDAL) 0.5 MG tablet Take 0.5 mg by mouth at bedtime.   Yes Historical Provider, MD  simvastatin (ZOCOR) 20 MG tablet Take 20 mg by mouth at bedtime.    Yes Historical Provider, MD  oxyCODONE (OXY IR/ROXICODONE) 5 MG immediate release tablet Take 1 tablet (5 mg total) by mouth every 6 (six) hours as needed for severe pain. 07/01/13   Dara Lords, PA-C   Labs:  Results for orders placed during the hospital encounter of 07/01/13 (from the past 48 hour(s))  POCT I-STAT 4, (NA,K, GLUC, HGB,HCT)     Status: None   Collection Time    07/01/13  6:53 AM      Result Value Ref Range   Sodium 140  137 - 147 mEq/L   Potassium 4.5  3.7 - 5.3 mEq/L   Glucose, Bld 82  70 - 99 mg/dL   HCT 16.1  09.6 - 04.5 %   Hemoglobin 15.0  13.0 - 17.0 g/dL  GLUCOSE, CAPILLARY     Status: None   Collection Time    07/01/13 10:03 AM      Result Value Ref Range   Glucose-Capillary 99  70 - 99 mg/dL   Comment 1 Documented in Chart     Comment 2 Notify RN     Review of systems not obtained due to patient factors.  Physical Exam: Filed Vitals:   07/01/13 1100  BP: 113/46  Pulse: 85  Temp: 98.1 F (36.7 C)  Resp: 15     General appearance: somnolent post-surgery, no distress Head: Normocephalic, without obvious abnormality,  atraumatic Neck: no adenopathy, no carotid bruit, no JVD and supple, symmetrical, trachea midline Resp: clear to auscultation bilaterally Cardio: regular rate and rhythm, S1, S2 normal, no murmur, click, rub or gallop GI: soft, non-tender; bowel sounds normal; no  masses,  no organomegaly Extremities:  no cyanosis or edema, new left thigh graft Neurologic: Unable to examine Dialysis Access: L IJ catheter, new R thigh AVG with + bruit   Assessment/Plan: 1. Dialysis access - new R thigh AVG placed today per Dr. Edilia Bo, stable s/p surgery. 2. ESRD - HD on TTS @ [facility], using L IJ catheter, K 4.5.  Next HD tomorrow morning. 3. Hypertension/volume - BP 113/46, stable, on Midodrine 10 mg tid, wt 82.6 kg with EDW 81.5. 4. Anemia - Hgb 15, last T-sat 33%, ferritin 1579; no Epogen or Fe. 5. Metabolic bone disease - Last Ca 9.2, P 4, iPTH 426; Calcitriol 1 mg PO TTS, Sensipar 180 mg qd, Phoslo & Fosrenol with meals. 6. Nutrition - renal diet & vitamin. 7. Depression - on Lexapro 8. Hx CVA / dementia  LYLES,CHARLES 07/01/2013, 11:31 AM   Attending Nephrologist: Delano Metz, MD  I have seen and examined patient, discussed with PA and agree with assessment and plan as outlined above with additions as indicated. Vinson Moselle MD pager [phone]    cell [phone] 07/01/2013, 5:04 PM

## 2013-07-01 NOTE — Transfer of Care (Signed)
Immediate Anesthesia Transfer of Care Note  Patient: [name]  Procedure(s) Performed: Procedure(s): INSERTION OF ARTERIOVENOUS (AV) GORE-TEX GRAFT THIGH (Right)  Patient Location: PACU  Anesthesia Type:General  Level of Consciousness: sedated  Airway & Oxygen Therapy: Patient Spontanous Breathing and Patient connected to face mask oxygen  Post-op Assessment: Report given to PACU RN and Post -op Vital signs reviewed and stable  Post vital signs: Reviewed and stable  Complications: No apparent anesthesia complications

## 2013-07-01 NOTE — Anesthesia Preprocedure Evaluation (Addendum)
Anesthesia Evaluation  Patient identified by MRN, date of birth, ID band Patient confused  General Assessment Comment:Anesthesia phone consent obtained from POA, Harvest Foresteginald Suriano. Al questions answered.  Reviewed: Allergy & Precautions, H&P , NPO status , Patient's Chart, lab work & pertinent test results  History of Anesthesia Complications Negative for: history of anesthetic complications  Airway Mallampati: II TM Distance: >3 FB Neck ROM: Limited    Dental  (+) Edentulous Upper, Edentulous Lower   Pulmonary neg pulmonary ROS,    Pulmonary exam normal       Cardiovascular hypertension, + CAD and + Peripheral Vascular Disease Rhythm:Regular + Systolic murmurs    Neuro/Psych PSYCHIATRIC DISORDERS Anxiety Depression Dementia CVA    GI/Hepatic GERD-  ,  Endo/Other  diabetesHyperthyroidism   Renal/GU ESRF and DialysisRenal disease     Musculoskeletal   Abdominal   Peds  Hematology   Anesthesia Other Findings   Reproductive/Obstetrics                         Anesthesia Physical Anesthesia Plan  ASA: III  Anesthesia Plan: General   Post-op Pain Management:    Induction: Intravenous  Airway Management Planned: LMA  Additional Equipment:   Intra-op Plan:   Post-operative Plan: Extubation in OR  Informed Consent:   Dental advisory given  Plan Discussed with: CRNA, Anesthesiologist and Surgeon  Anesthesia Plan Comments:        Anesthesia Quick Evaluation

## 2013-07-01 NOTE — Progress Notes (Signed)
Pt arrived from the PACU post graft placement. I attempted to locate pt's right dorsalis pedal pulse with the doppler and was unsuccessful. I had two other RN's attempt to find it as well, and we were all unsuccessful. MD Edilia Boickson notified. According to him, the pt's pulse has been faint, and it comes and goes, and this is what he expected. Informed RN staff to monitor for pain. If he starts to have pain in the right leg distal to graft, we are to notify MD. Otherwise, VVS will re-assess tomorrow morning. Will continue to monitor right leg for pain, change in color, change in temperature, since pulses are unable to be assessed at this time.

## 2013-07-01 NOTE — Anesthesia Postprocedure Evaluation (Signed)
Anesthesia Post Note  Patient: [name]  Procedure(s) Performed: Procedure(s) (LRB): INSERTION OF ARTERIOVENOUS (AV) GORE-TEX GRAFT THIGH (Right)  Anesthesia type: general  Patient location: PACU  Post pain: Pain level controlled  Post assessment: Patient's Cardiovascular Status Stable  Last Vitals:  Filed Vitals:   07/01/13 1100  BP: 113/46  Pulse: 85  Temp: 36.7 C  Resp: 15    Post vital signs: Reviewed and stable  Level of consciousness: sedated  Complications: No apparent anesthesia complications

## 2013-07-01 NOTE — Interval H&P Note (Signed)
History and Physical Interval Note:  07/01/2013 7:17 AM  [name]  has presented today for surgery, with the diagnosis of End stage renal disease  The various methods of treatment have been discussed with the patient and family. After consideration of risks, benefits and other options for treatment, the patient has consented to  Procedure(s): INSERTION OF ARTERIOVENOUS (AV) GORE-TEX GRAFT THIGH (Right) as a surgical intervention .  The patient's history has been reviewed, patient examined, no change in status, stable for surgery.  I have reviewed the patient's chart and labs.  Questions were answered to the patient's satisfaction.     DICKSON,CHRISTOPHER S

## 2013-07-01 NOTE — H&P (View-Only) (Signed)
[facility]ist of [place]  Patient name: Jerome BeechJoseph N [name] MRN: [id number] DOB: 07-23-1931 Sex: male  REASON FOR VISIT: Evaluate for new hemodialysis access.  HPI: [name] is a 80 y.o. male Who dialyzes on Tuesdays Thursdays and Saturdays. He has had forearm and upper arm grafts in both upper extremities. He currently dialyzes with a left IJ tunneled dialysis catheter. We have been asked to evaluate him for new access.   He denies any recent uremic symptoms. Specifically he denies nausea, vomiting, out the patient's, anorexia, or fatigue.   Past Medical History  Diagnosis Date  . Diabetes mellitus   . Hypertension   . Arthritis   . Cataract   . CAD (coronary artery disease)   . Secondary hyperthyroidism   . Dementia   . History of thrombocytopenia   . GERD (gastroesophageal reflux disease)   . Anxiety     paranoia  . Stroke   . Chronic kidney disease     Tue, Thurs, Sat [facility] [address] [facility]  . Anemia    No family history on file. SOCIAL HISTORY: History  Substance Use Topics  . Smoking status: Never Smoker   . Smokeless tobacco: Never Used  . Alcohol Use: Yes     Comment: approx 30 years ago   No Known Allergies Current Outpatient Prescriptions  Medication Sig Dispense Refill  . acetaminophen (TYLENOL) 650 MG CR tablet Take 650 mg by mouth every 4 (four) hours as needed for pain or fever (and headache).      . Amino Acids-Protein Hydrolys (FEEDING SUPPLEMENT, PRO-STAT SUGAR FREE 64,) LIQD Take 30 mLs by mouth 2 (two) times daily.      Marland Kitchen. b complex-vitamin c-folic acid (NEPHRO-VITE) 0.8 MG TABS tablet Take 1 tablet by mouth at bedtime.      . bisacodyl (DULCOLAX) 10 MG suppository Place 10 mg rectally daily as needed (for no bowel movement).       . calcium acetate (PHOSLO) 667 MG capsule Take 667 mg by mouth 3 (three) times daily with meals.       . cinacalcet (SENSIPAR) 90 MG tablet Take 180 mg by mouth daily at 12 noon.       Marland Kitchen.  dextrose 5 G chewable tablet Chew 37.5 g by mouth as needed for low blood sugar. During dialysis PRN      . diphenhydrAMINE (BENADRYL) 25 MG tablet Take 25 mg by mouth as needed for itching.      . docusate sodium (COLACE) 100 MG capsule Take 200 mg by mouth at bedtime.       Marland Kitchen. escitalopram (LEXAPRO) 20 MG tablet Take 20 mg by mouth daily.      Marland Kitchen. ethyl chloride spray Apply 1 application topically See admin instructions. Apply to dialysis site prior to dialysis (tuesday, Thursday, Saturday)      . lanthanum (FOSRENOL) 1000 MG chewable tablet Chew 1 tablet (1,000 mg total) by mouth 2 (two) times daily with a meal.      . loperamide (IMODIUM) 2 MG capsule Take 2 mg by mouth as needed for diarrhea or loose stools.      Marland Kitchen. LORazepam (ATIVAN) 0.5 MG tablet Take 0.5 mg by mouth every 4 (four) hours as needed for anxiety.      . midodrine (PROAMATINE) 10 MG tablet Take 10 mg by mouth 3 (three) times daily.      . Multiple Vitamins-Minerals (MULTIVITAMIN PO) Take 1 tablet by mouth daily.      .Marland Kitchen  omeprazole (PRILOSEC) 20 MG capsule Take 20 mg by mouth daily.      Marland Kitchen. oxyCODONE (OXY IR/ROXICODONE) 5 MG immediate release tablet Take 1 tablet (5 mg total) by mouth 2 (two) times daily as needed for severe pain.  30 tablet  0  . risperiDONE (RISPERDAL) 0.25 MG tablet Take 0.25 mg by mouth at bedtime.       . simvastatin (ZOCOR) 20 MG tablet Take 20 mg by mouth at bedtime.        No current facility-administered medications for this visit.   REVIEW OF SYSTEMS: Arly.Keller[X ] denotes positive finding; [  ] denotes negative finding  CARDIOVASCULAR:  [ ]  chest pain   [ ]  chest pressure   [ ]  palpitations   [ ]  orthopnea   [ ]  dyspnea on exertion   [ ]  claudication   [ ]  rest pain   [ ]  DVT   [ ]  phlebitis PULMONARY:   [ ]  productive cough   [ ]  asthma   [ ]  wheezing NEUROLOGIC:   [ ]  weakness  [ ]  paresthesias  [ ]  aphasia  [ ]  amaurosis  [ ]  dizziness HEMATOLOGIC:   [ ]  bleeding problems   [ ]  clotting  disorders MUSCULOSKELETAL:  [ ]  joint pain   [ ]  joint swelling [ ]  leg swelling GASTROINTESTINAL: [ ]   blood in stool  [ ]   hematemesis GENITOURINARY:  [ ]   dysuria  [ ]   hematuria PSYCHIATRIC:  [ ]  history of major depression INTEGUMENTARY:  [ ]  rashes  [ ]  ulcers CONSTITUTIONAL:  [ ]  fever   [ ]  chills  PHYSICAL EXAM: Filed Vitals:   06/15/13 1549  BP: 87/51  Pulse: 78  Height: 5\' 6"  (1.676 m)  Weight: 190 lb (86.183 kg)  SpO2: 100%   Body mass index is 30.68 kg/(m^2). GENERAL: The patient is a well-nourished male, in no acute distress. The vital signs are documented above. CARDIOVASCULAR: There is a regular rate and rhythm. He has palpable radial pulses bilaterally. He has palpable femoral pulses. I cannot palpate pedal pulses. He has mild bilateral lower extremity swelling. PULMONARY: There is good air exchange bilaterally without wheezing or rales. ABDOMEN: Soft and non-tender with normal pitched bowel sounds.  MUSCULOSKELETAL: There are no major deformities or cyanosis. NEUROLOGIC: No focal weakness or paresthesias are detected. SKIN: There are no ulcers or rashes noted. PSYCHIATRIC: The patient has a normal affect.  DATA:  I have independently interpreted his vein mapping. He does not appear to have adequate cephalic vein or basilic vein on either side.  I've also independently interpreted his arterial Doppler study was upper cherry which showed biphasic Doppler signals in both upper extremities.  MEDICAL ISSUES:  End stage renal disease on dialysis I think the next best option for hemodialysis access would be a right thigh AV graft. I have discussed indications for the procedure and the potential complications including but not limited to the risk of graft thrombosis, graft infection, and steal symptoms. We would probably keep him overnight and have him dialyzed in the hospital the following day. We'll have to watch closely for steal as he does have evidence of some  infrainguinal arterial occlusive disease.   DICKSON,CHRISTOPHER S [facility] [place] Beeper: [phone]

## 2013-07-01 NOTE — Op Note (Signed)
    NAME: Jerome BeechJoseph N [name]    MRN: [id number] DOB: 1931-06-04    DATE OF OPERATION: 07/01/2013  PREOP DIAGNOSIS: Stage V chronic kidney disease  POSTOP DIAGNOSIS: same  PROCEDURE: new right thigh AV graft (4-7 mm PTFE graft)  SURGEON: Di Kindlehristopher S. Edilia Boickson, MD, FACS  ASSIST: Karsten RoKim Trinh, Student  ANESTHESIA: Gen.   EBL: minimal  INDICATIONS: Jerome BeechJoseph N Kelley is a 80 y.o. male who presents for new access. He has exhausted his upper extremity options. He has a functioning tunnel dialysis catheter.  FINDINGS: the arterial anastomosis was at the distal common femoral artery. The venous anastomosis was at the saphenofemoral junction.  TECHNIQUE: Patient was brought to the operating room and received a general anesthetic. The right thigh and right groin were prepped and draped in usual sterile fashion. An oblique incision was made in the right groin and dissection carried down to the common femoral artery which was dissected free and controlled. The superficial femoral artery and deep femoral arteries were also controlled. The saphenous vein was dissected free and was somewhat small. I dissected up to the saphenofemoral junction. Using one distal counterincision, a 4-7 mm tapered PTFE graft was tunneled in a loop fashion in the thigh with the arterial aspect of the graft on the lateral aspect of thigh. The patient was heparinized. The common femoral artery was clamped proximally and the superficial femoral artery and deep femoral arteries were controlled. A short segment of the 4 mm end of the graft was excised the graft slightly spatulated and sewn end to side to the distal common femoral artery using continuous 6-0 Prolene suture. The graft then pulled the appropriate length for anastomosis the saphenofemoral junction. A Cooley clamp was placed around the femoral vein at the saphenofemoral junction. The saphenous vein was ligated and divided and then spatulated onto the femoral vein. The 7 mm end of the  graft was slightly spatulated and sewn end-to-side to the femoral vein using continuous 6-0 Prolene suture. At completion was a good thrill in the graft. Hemostasis was obtained in the wound. The distal counterincision was closed with deep layer 3-0 Vicryl. The skin was closed with 4-0 Vicryl. The groin incision was closed with a deep layer of 3-0 Vicryl in the subcutaneous layer with 3-0 Vicryl. The skin was closed with 4-0 subcuticular stitch. Dermabond was applied. The patient tolerated the procedure well and was transferred to the recovery room in stable condition. All needle and sponge counts were correct.  Waverly Ferrarihristopher Hewitt Garner, MD, FACS [facility]  DATE OF DICTATION:   07/01/2013

## 2013-07-01 NOTE — Discharge Instructions (Signed)
° ° °  07/01/2013 Jerome BeechJoseph N Vought [id number] 02/19/1932  Surgeon(s): Chuck Hinthristopher S Dickson, MD  Procedure(s): INSERTION OF ARTERIOVENOUS (AV) GORE-TEX GRAFT THIGH  x Do not stick graft for 4 weeks

## 2013-07-02 LAB — CBC
HCT: 36.7 % — ABNORMAL LOW (ref 39.0–52.0)
HEMOGLOBIN: 11.6 g/dL — AB (ref 13.0–17.0)
MCH: 29 pg (ref 26.0–34.0)
MCHC: 31.6 g/dL (ref 30.0–36.0)
MCV: 91.8 fL (ref 78.0–100.0)
Platelets: 81 10*3/uL — ABNORMAL LOW (ref 150–400)
RBC: 4 MIL/uL — AB (ref 4.22–5.81)
RDW: 15.5 % (ref 11.5–15.5)
WBC: 6.5 10*3/uL (ref 4.0–10.5)

## 2013-07-02 LAB — RENAL FUNCTION PANEL
Albumin: 2.5 g/dL — ABNORMAL LOW (ref 3.5–5.2)
BUN: 28 mg/dL — ABNORMAL HIGH (ref 6–23)
CHLORIDE: 100 meq/L (ref 96–112)
CO2: 29 meq/L (ref 19–32)
CREATININE: 6.25 mg/dL — AB (ref 0.50–1.35)
Calcium: 11.4 mg/dL — ABNORMAL HIGH (ref 8.4–10.5)
GFR calc non Af Amer: 8 mL/min — ABNORMAL LOW (ref >90)
GFR, EST AFRICAN AMERICAN: 9 mL/min — AB (ref >90)
GLUCOSE: 92 mg/dL (ref 70–99)
Phosphorus: 7 mg/dL — ABNORMAL HIGH (ref 2.3–4.6)
Potassium: 4.4 mEq/L (ref 3.7–5.3)
Sodium: 142 mEq/L (ref 137–147)

## 2013-07-02 MED ORDER — LIDOCAINE-PRILOCAINE 2.5-2.5 % EX CREA: 1.0000 "application " | TOPICAL_CREAM | CUTANEOUS | Status: DC | PRN

## 2013-07-02 MED ORDER — SODIUM CHLORIDE 0.9 % IV SOLN: 100.0000 mL | INTRAVENOUS | Status: DC | PRN

## 2013-07-02 MED ORDER — ALTEPLASE 2 MG IJ SOLR: 2.0000 mg | Freq: Once | INTRAMUSCULAR | Status: DC | PRN

## 2013-07-02 MED ORDER — HEPARIN SODIUM (PORCINE) 1000 UNIT/ML DIALYSIS: 1000.0000 [IU] | INTRAMUSCULAR | Status: DC | PRN

## 2013-07-02 MED ORDER — LIDOCAINE HCL (PF) 1 % IJ SOLN: 5.0000 mL | INTRAMUSCULAR | Status: DC | PRN

## 2013-07-02 MED ORDER — OXYCODONE HCL 5 MG PO TABS: 5.0000 mg | ORAL_TABLET | Freq: Two times a day (BID) | ORAL | Status: DC | PRN

## 2013-07-02 MED ORDER — MIDODRINE HCL 5 MG PO TABS
ORAL_TABLET | ORAL | Status: AC
  Filled 2013-07-02: qty 2

## 2013-07-02 MED ORDER — NEPRO/CARBSTEADY PO LIQD: 237.0000 mL | ORAL | Status: DC | PRN

## 2013-07-02 MED ORDER — PENTAFLUOROPROP-TETRAFLUOROETH EX AERO: 1.0000 "application " | INHALATION_SPRAY | CUTANEOUS | Status: DC | PRN

## 2013-07-02 NOTE — Discharge Summary (Signed)
Vascular and Vein Specialists Discharge Summary   Patient ID:  [name] MRN: [id number] DOB/AGE: February 23, 1932 80 y.o.  Admit date: 07/01/2013 Discharge date: 07/02/2013 Date of Surgery: 07/01/2013 Surgeon: Surgeon(s): Chuck Hint, MD  Admission Diagnosis: End stage renal disease  Discharge Diagnoses:  End stage renal disease  Secondary Diagnoses: Past Medical History  Diagnosis Date  . Diabetes mellitus   . Hypertension   . Arthritis   . Cataract   . CAD (coronary artery disease)   . Secondary hyperthyroidism   . Dementia   . History of thrombocytopenia   . GERD (gastroesophageal reflux disease)   . Anxiety     paranoia  . Stroke   . Chronic kidney disease     Tue, Thurs, Sat dialysis, goes [address]  . Anemia   . Psychosis   . Hypotension   . Cataract   . Delusional disorder   . Hyperlipidemia, mixed   . ESRD (end stage renal disease)     Procedure(s): INSERTION OF ARTERIOVENOUS (AV) GORE-TEX GRAFT THIGH  Discharged Condition: good  HPI: [name] is a 80 y.o. male Who dialyzes on Tuesdays Thursdays and Saturdays. He has had forearm and upper arm grafts in both upper extremities. He currently dialyzes with a left IJ tunneled dialysis catheter. We have been asked to evaluate him for new access.  He under went surgery for a right thigh AV graft (4-7 mm PTFE graft).  He was admitted over night received dialysis via the IJ and is being discharged to the skilled nursing facility today.  His stay was uneventful and he is in stable condition.  No follow up with Dr. Edilia Bo is needed.  He will be seen on an as needed basis.      Hospital Course:  [name] is a 80 y.o. male is S/P Right Procedure(s): INSERTION OF ARTERIOVENOUS (AV) GORE-TEX GRAFT THIGH Extubated: POD # 0 Physical exam: Good pulse and palpable thrill. Incisions healing nicely. Post-op wounds healing well Pt. Ambulating, voiding and taking PO diet without  difficulty. Pt pain controlled with PO pain meds. Labs as below Complications:none  Consults:  Treatment Team:  Maree Krabbe, MD  Significant Diagnostic Studies: CBC Lab Results  Component Value Date   WBC 6.5 07/02/2013   HGB 11.6* 07/02/2013   HCT 36.7* 07/02/2013   MCV 91.8 07/02/2013   PLT 81* 07/02/2013    BMET    Component Value Date/Time   NA 142 07/02/2013 0852   K 4.4 07/02/2013 0852   CL 100 07/02/2013 0852   CO2 29 07/02/2013 0852   GLUCOSE 92 07/02/2013 0852   BUN 28* 07/02/2013 0852   CREATININE 6.25* 07/02/2013 0852   CALCIUM 11.4* 07/02/2013 0852   GFRNONAA 8* 07/02/2013 0852   GFRAA 9* 07/02/2013 0852   COAG Lab Results  Component Value Date   INR 1.09 07/01/2013   INR 1.28 05/03/2013   INR 1.09 04/26/2013     Disposition:  Discharge to :Skilled nursing facility Discharge Orders   Future Appointments Provider Department Dept Phone   09/19/2013 1:30 PM Lenn Sink, DPM [facility] [phone]   Future Orders Complete By Expires   Call MD for:  redness, tenderness, or signs of infection (pain, swelling, bleeding, redness, odor or green/yellow discharge around incision site)  As directed    Call MD for:  severe or increased pain, loss or decreased feeling  in affected limb(s)  As directed  Call MD for:  temperature >100.5  As directed    Discharge patient  As directed    Comments:     Discharge pt to home   Discharge patient  As directed    Comments:     Discharge pt to home   may wash over wound with mild soap and water  As directed    Resume previous diet  As directed        Medication List         acetaminophen 650 MG CR tablet  Commonly known as:  TYLENOL  Take 650 mg by mouth every 4 (four) hours as needed for pain or fever (and headache).     b complex-vitamin c-folic acid 0.8 MG Tabs tablet  Take 1 tablet by mouth at bedtime.     bisacodyl 10 MG suppository  Commonly known as:  DULCOLAX  Place 10 mg rectally  daily as needed (for no bowel movement).     calcium acetate 667 MG capsule  Commonly known as:  PHOSLO  Take 667 mg by mouth 3 (three) times daily with meals.     cinacalcet 90 MG tablet  Commonly known as:  SENSIPAR  Take 180 mg by mouth daily at 12 noon.     diphenhydrAMINE 25 MG tablet  Commonly known as:  BENADRYL  Take 25 mg by mouth every 8 (eight) hours as needed for itching.     docusate sodium 100 MG capsule  Commonly known as:  COLACE  Take 200 mg by mouth at bedtime.     escitalopram 20 MG tablet  Commonly known as:  LEXAPRO  Take 20 mg by mouth daily.     feeding supplement (PRO-STAT SUGAR FREE 64) Liqd  Take 30 mLs by mouth 2 (two) times daily.     lanthanum 1000 MG chewable tablet  Commonly known as:  FOSRENOL  Chew 1 tablet (1,000 mg total) by mouth 2 (two) times daily with a meal.     loperamide 2 MG capsule  Commonly known as:  IMODIUM  Take 2 mg by mouth as needed for diarrhea or loose stools.     LORazepam 0.5 MG tablet  Commonly known as:  ATIVAN  Take 0.5 mg by mouth every 4 (four) hours as needed for anxiety.     midodrine 10 MG tablet  Commonly known as:  PROAMATINE  Take 10 mg by mouth 3 (three) times daily.     omeprazole 20 MG capsule  Commonly known as:  PRILOSEC  Take 20 mg by mouth daily.     oxyCODONE 5 MG immediate release tablet  Commonly known as:  Oxy IR/ROXICODONE  Take 1 tablet (5 mg total) by mouth every 6 (six) hours as needed for severe pain.     risperiDONE 0.5 MG tablet  Commonly known as:  RISPERDAL  Take 0.5 mg by mouth at bedtime.     simvastatin 20 MG tablet  Commonly known as:  ZOCOR  Take 20 mg by mouth at bedtime.       Verbal and written Discharge instructions given to the patient. Wound care per Discharge AVS     Follow-up Information   Follow up with DICKSON,CHRISTOPHER S, MD In 2 weeks. (office will call you to arrange an appointment (sent))    Specialty:  Vascular Surgery   Contact information:    [address] [place] [address] [phone]       Signed: Clinton GallantCOLLINS, Yarixa [facility] 07/02/2013, 2:12 PM

## 2013-07-02 NOTE — Progress Notes (Signed)
Subjective:  No complaints, denies pain  Objective: Vital signs in last 24 hours: Temp:  [97.5 F (36.4 C)-99.3 F (37.4 C)] 98.3 F (36.8 C) (02/14 0513) Pulse Rate:  [78-94] 85 (02/14 0513) Resp:  [12-27] 16 (02/14 0513) BP: (93-122)/(40-69) 103/47 mmHg (02/14 0513) SpO2:  [99 %-100 %] 100 % (02/14 0513) FiO2 (%):  [3 %] 3 % (02/13 1153) Weight:  [79.606 kg (175 lb 8 oz)] 79.606 kg (175 lb 8 oz) (02/13 2032) Weight change:   Intake/Output from previous day: 02/13 0701 - 02/14 0700 In: 640 [P.O.:240; I.V.:400] Out: 50 [Blood:50] Intake/Output this shift: Total I/O In: 240 [P.O.:240] Out: -   Lab Results:  Recent Labs  07/01/13 0653 07/01/13 1258  WBC  --  7.3  HGB 15.0 12.7*  HCT 44.0 40.2  PLT  --  69*   BMET:  Recent Labs  07/01/13 0653 07/01/13 1258  NA 140 143  K 4.5 3.8  CL  --  101  CO2  --  26  GLUCOSE 82 86  BUN  --  22  CREATININE  --  5.12*  CALCIUM  --  11.3*  ALBUMIN  --  2.7*   No results found for this basename: PTH,  in the last 72 hours Iron Studies: No results found for this basename: IRON, TIBC, TRANSFERRIN, FERRITIN,  in the last 72 hours  EXAM: General appearance:  Sleepy, in no apparent distress Resp:  CTA without rales, rhonchi, or wheezes Cardio:  RRR with Gr II/VI systolic murmur, no rub GI: + BS, soft and nontender Extremities:  No edema Access:  L IJ catheter, new R thigh AVG with + bruit, no redness  Dialysis Orders: TTS @ [facility]  4 hrs 400/800 81.5 kg 2K/2Ca Heparin 2600 U L IJ catheter  Calcitriol 1 mcg PO at dialysis on TTS Epogen 0 Venofer 0  Assessment/Plan: 1. Dialysis access - new R thigh AVG placed 2/13 per Dr. Edilia Boickson, stable.  2. ESRD - HD on TTS @ [facility], using L IJ catheter, K 3.8. HD pending today.  3. Hypertension/volume - BP 103/47, stable, on Midodrine 10 mg tid, wt 79.6 kg with EDW 81.5.  4. Anemia - Hgb 12.7, last T-sat 33%, ferritin 1579; no Epogen or Fe.  5. Metabolic bone disease - Ca 11.3 (12.3  corrected), last P 4, iPTH 426; Calcitriol 1 mg PO TTS, Sensipar 180 mg qd, Phoslo & Fosrenol with meals. Use low-Ca bath, hold Calcitriol & Phoslo. 6. Nutrition - renal diet & vitamin.  7. Depression - on Lexapro  8. Hx CVA / dementia   LOS: 1 day   LYLES,[name] 07/02/2013,6:43 AM  I have seen and examined patient, discussed with PA and agree with assessment and plan as outlined above.  On HD, BP low, below dry wt, keep even. For d/c after HD today. Vinson Moselleob Avram Danielson MD pager [phone]    cell [phone] 07/02/2013, 11:21 AM

## 2013-07-02 NOTE — Progress Notes (Signed)
Patient ID: Jerome BeechJoseph N [name], male   DOB: December 17, 1931, 80 y.o.   MRN: [id number] Right thigh AV graft examined. Good pulse and palpable thrill. Incisions healing nicely. Patient currently on hemodialysis.  DC home following dialysis today

## 2013-07-02 NOTE — Progress Notes (Signed)
07/02/13 1704 [facility]sg [facility]. Report given to receiving RN. Updated that meds are given for am shift.

## 2013-07-02 NOTE — Clinical Social Work Psychosocial (Signed)
Clinical Social Work Department BRIEF PSYCHOSOCIAL ASSESSMENT 07/02/2013  Patient:  [name],[name]     Account Number:  [id number]     Admit date:  07/01/2013  Clinical Social Worker:  Lavell LusterAMPBELL,Atreyu BRYANT, LCSWA  Date/Time:  07/02/2013 02:56 PM  Referred by:  Physician  Date Referred:  07/02/2013 Referred for  SNF Placement   Other Referral:   Interview type:  Family Other interview type:   Patient not oriented at time of assessment. CSW interviewed son by phone.    PSYCHOSOCIAL DATA Living Status:  FACILITY Admitted from facility:  [facility] [place] Level of care:  Skilled Nursing Facility Primary support name:  [name] Primary support relationship to patient:  CHILD, ADULT Degree of support available:   Support is good.    CURRENT CONCERNS Current Concerns  Post-Acute Placement   Other Concerns:    SOCIAL WORK ASSESSMENT / PLAN CSW spoke with patient's son over phone to confirm [facility] . Patient's son [name] [facility] and [facility] been a resident there for about 2 years. CSW asked how patient's family is feeling about patient going back to [facility]. Patient's son states that they are happy with the care he receives there and want him to go back. CSW has contacted facility and spoke with "Thayer Ohmhris". Thayer OhmChris states that she can accept patient back today and that there will be no need for an FL2 on patient as he has been gone less than 24 hrs. Patient's son states that patient will need to be transported by ambulance.   Assessment/plan status:  No Further Intervention Required Other assessment/ plan:   CSW has faxed DC summary to facility per Chris's request.   Information/referral to community resources:   CSW contact information given to son.    PATIENT'S/FAMILY'S RESPONSE TO PLAN OF CARE: Patient's son is agreeable to patient returning to [facility]. [name] was pleasant, appropriate,  and appreciative for CSW contact. CSW will assist with DC back to facility.       Roddie McBryant Yuri Flener, [place], [place], [phone]

## 2013-07-02 NOTE — Clinical Social Work Note (Signed)
Weekend CSW received call from unit about patient DC'ing to SNF today. [facility]. CSW contacted facility and spoke with a "Chris". She states that patient does not need an FL2 since he has been gone less that 24hrs. And they will be able to accept patient back today. Psychosocial assessment will be completed soon. CSW is now preparing patient's DC packet and will request EMS transport and give RN number for report.  Roddie McBryant Jonisha Kindig, [place] Shore[place]CSWA, [place] Hall[place]ASA, [phone]

## 2013-07-04 ENCOUNTER — Telehealth: Payer: Self-pay | Admitting: Vascular Surgery

## 2013-07-04 NOTE — Telephone Encounter (Addendum)
Message copied by Fredrich BirksMILLIKAN, DANA P on Mon Jul 04, 2013  2:51 PM ------      Message from: Phillips OdorPULLINS, CAROL S      Created: Fri Jul 01, 2013  1:09 PM      Regarding: Tiburcio BashKay log                   ----- Message -----         From: Chuck Hinthristopher S Dickson, MD         Sent: 07/01/2013  10:08 AM           To: [facility] Charge Pool      Subject: charge and f/u                                           PROCEDURE: new right thigh AV graft (4-7 mm PTFE graft)            SURGEON: Di Kindlehristopher S. Edilia Boickson, MD, FACS            ASSIST: Karsten RoKim Trinh, Student            He'll need a follow up visit in 2-3 weeks. Thank you. CD ------  07/04/13: [facility], dpm

## 2013-07-05 ENCOUNTER — Encounter (HOSPITAL_COMMUNITY): Payer: Self-pay | Admitting: Vascular Surgery

## 2013-07-08 ENCOUNTER — Non-Acute Institutional Stay (SKILLED_NURSING_FACILITY): Payer: Medicare Other | Admitting: Internal Medicine

## 2013-07-08 DIAGNOSIS — K219 Gastro-esophageal reflux disease without esophagitis: Secondary | ICD-10-CM

## 2013-07-08 DIAGNOSIS — Z7189 Other specified counseling: Secondary | ICD-10-CM

## 2013-07-08 DIAGNOSIS — F0153 Vascular dementia, unspecified severity, with mood disturbance: Secondary | ICD-10-CM

## 2013-07-08 DIAGNOSIS — N186 End stage renal disease: Secondary | ICD-10-CM

## 2013-07-08 DIAGNOSIS — F329 Major depressive disorder, single episode, unspecified: Principal | ICD-10-CM

## 2013-07-08 DIAGNOSIS — Z992 Dependence on renal dialysis: Secondary | ICD-10-CM

## 2013-07-08 DIAGNOSIS — F0151 Vascular dementia with behavioral disturbance: Secondary | ICD-10-CM

## 2013-07-08 DIAGNOSIS — I9589 Other hypotension: Secondary | ICD-10-CM

## 2013-07-08 DIAGNOSIS — F0150 Vascular dementia without behavioral disturbance: Secondary | ICD-10-CM

## 2013-07-08 DIAGNOSIS — R627 Adult failure to thrive: Secondary | ICD-10-CM

## 2013-07-08 NOTE — Progress Notes (Signed)
Patient ID: [name], male   DOB: [birth year], 80 y.o.   MRN: [id number]    [facility] [place]  Chief Complaint  Patient presents with  . Acute Visit    lethargy, excessive sleepiness   No Known Allergies  HPI   80 y/o male patient is a long term resident here. He has ESRD on HD, PVD, hyperlipidemia, dementia among others. He has been slowly declining.he has ran out of all vascular access for dialysis in his upper extremities. He had a new vascular access placed in his right thigh and currently has a functioning tunnel dialysis catheter on left chest. His dementia has worsened. His po intake has decreased, has increased sleepiness and is mostly in his bed. He also has pressure sore.  Not participating much in conversation today thus making ROS difficult  Review of Systems   Constitutional: Negative for fever, chills, diaphoresis.   HENT: Negative for congestion, hearing loss and sore throat.    Eyes: Negative for blurred vision, double vision and discharge.   Respiratory: Negative for cough, sputum production, shortness of breath and wheezing.    Cardiovascular: Negative for chest pain, palpitations, orthopnea. Has leg edema   Gastrointestinal: Negative for heartburn, nausea, vomiting, abdominal pain, diarrhea and constipation.   Genitourinary: Negative for dysuria, urgency, frequency and flank pain.   Neurological: Negative for dizziness Psychiatric/Behavioral: has memory loss    Past Medical History  Diagnosis Date  . Diabetes mellitus   . Hypertension   . Arthritis   . Cataract   . CAD (coronary artery disease)   . Secondary hyperthyroidism   . Dementia   . History of thrombocytopenia   . GERD (gastroesophageal reflux disease)   . Anxiety     paranoia  . Stroke   . Chronic kidney disease     Tue, Thurs, Sat [place] [address]  . Anemia   . Psychosis   . Hypotension   . Cataract   . Delusional disorder   . Hyperlipidemia, mixed   . ESRD (end  stage renal disease)    Med[facility]. [facility]  Physical exam 104/50, 90, 18, 94% room air, 182 lb, 98.4  Constitutional: lethargic,overweight  Neck: Neck supple. No JVD present. No thyromegaly present.   Cardiovascular: Normal rate and regular rhythm.    Respiratory: Effort normal. decreased basilar air entry, no crackles or rhonchi appreciated. No respiratory distress. He has no wheezes.   GI: Soft. Bowel sounds are normal. He exhibits no distension. There is no tenderness.  Musculoskeletal: edema in both lower extremities, has dialysis catheter in left chest area Neurological: unable to assess orientation with pt not participating in conversation Skin: Skin is warm and dry Psychiatric: Unable to assess at present   Assessment/plan  80 y/o male pt with ESRD, PVD with vascular complications and dementia is declining with his overall health condition. Spoke with his sons- one face to face and another over the phone. Explained about his poor prognosis and they understand this. Reviewed goals of care. Both son want him to be DNR but another son wants NG tube placed if pt refuses to eat. They agree with having palliative care team involved. Main goal will be comfort care but NG tube to be placed if po intake declines. They want to continue with dialysis for now  End stage renal disease on dialysis Continue dialysis 3/week and renal supplement for now. Weight stable  Vascular dementia bp stable. Monitor clinically. Has been having slow decline. Talked  about his poor prognosis with his family today. Skin care, pressure ulcer prophylaxis and aspiration precautions. Assistance with feed for now  FTT Continue remeron for now. Palliative care consult. Continue po supplement as tolerated  Hypotension bp well controlled. On midodrine for now, monitor clinically  Depression continue lexapro 20 mg daily and ativan 0.5 mg every 4 hours as needed for anxiety. Also continue his risperdal  0.25 nightly and 0.25 mg on dialysis and will monitor his status  GERD   Will continue prilosec 20 mg daily for stress prophylaxis as well      Oneal Grout, MD  [facility] [phone] (Monday-Friday 8 am - 5 pm) [phone] (afterhours)

## 2013-07-09 DIAGNOSIS — F329 Major depressive disorder, single episode, unspecified: Principal | ICD-10-CM

## 2013-07-09 DIAGNOSIS — F0150 Vascular dementia without behavioral disturbance: Secondary | ICD-10-CM | POA: Insufficient documentation

## 2013-07-09 DIAGNOSIS — R627 Adult failure to thrive: Secondary | ICD-10-CM | POA: Insufficient documentation

## 2013-07-09 DIAGNOSIS — I9589 Other hypotension: Secondary | ICD-10-CM | POA: Insufficient documentation

## 2013-07-09 DIAGNOSIS — Z7189 Other specified counseling: Secondary | ICD-10-CM | POA: Insufficient documentation

## 2013-07-09 DIAGNOSIS — F0153 Vascular dementia, unspecified severity, with mood disturbance: Secondary | ICD-10-CM | POA: Insufficient documentation

## 2013-07-11 ENCOUNTER — Encounter (HOSPITAL_COMMUNITY): Payer: Self-pay | Admitting: Emergency Medicine

## 2013-07-11 ENCOUNTER — Emergency Department (HOSPITAL_COMMUNITY): Payer: Medicare Other

## 2013-07-11 ENCOUNTER — Inpatient Hospital Stay (HOSPITAL_COMMUNITY)
Admission: EM | Admit: 2013-07-11 | Discharge: 2013-07-15 | DRG: 871 | Disposition: A | Discharge status: Hospice | Payer: Medicare Other | Source: Skilled Nursing Facility | Attending: Internal Medicine | Admitting: Internal Medicine

## 2013-07-11 DIAGNOSIS — K5900 Constipation, unspecified: Secondary | ICD-10-CM

## 2013-07-11 DIAGNOSIS — R627 Adult failure to thrive: Secondary | ICD-10-CM

## 2013-07-11 DIAGNOSIS — Z515 Encounter for palliative care: Secondary | ICD-10-CM

## 2013-07-11 DIAGNOSIS — I959 Hypotension, unspecified: Secondary | ICD-10-CM | POA: Diagnosis present

## 2013-07-11 DIAGNOSIS — T82590A Other mechanical complication of surgically created arteriovenous fistula, initial encounter: Secondary | ICD-10-CM

## 2013-07-11 DIAGNOSIS — E876 Hypokalemia: Secondary | ICD-10-CM | POA: Diagnosis not present

## 2013-07-11 DIAGNOSIS — E785 Hyperlipidemia, unspecified: Secondary | ICD-10-CM

## 2013-07-11 DIAGNOSIS — I2510 Atherosclerotic heart disease of native coronary artery without angina pectoris: Secondary | ICD-10-CM | POA: Diagnosis present

## 2013-07-11 DIAGNOSIS — F0150 Vascular dementia without behavioral disturbance: Secondary | ICD-10-CM | POA: Diagnosis present

## 2013-07-11 DIAGNOSIS — A419 Sepsis, unspecified organism: Principal | ICD-10-CM

## 2013-07-11 DIAGNOSIS — L8992 Pressure ulcer of unspecified site, stage 2: Secondary | ICD-10-CM

## 2013-07-11 DIAGNOSIS — R4182 Altered mental status, unspecified: Secondary | ICD-10-CM | POA: Diagnosis present

## 2013-07-11 DIAGNOSIS — F329 Major depressive disorder, single episode, unspecified: Secondary | ICD-10-CM

## 2013-07-11 DIAGNOSIS — I509 Heart failure, unspecified: Secondary | ICD-10-CM | POA: Diagnosis present

## 2013-07-11 DIAGNOSIS — N058 Unspecified nephritic syndrome with other morphologic changes: Secondary | ICD-10-CM | POA: Diagnosis present

## 2013-07-11 DIAGNOSIS — R6521 Severe sepsis with septic shock: Secondary | ICD-10-CM

## 2013-07-11 DIAGNOSIS — F411 Generalized anxiety disorder: Secondary | ICD-10-CM | POA: Diagnosis present

## 2013-07-11 DIAGNOSIS — L89152 Pressure ulcer of sacral region, stage 2: Secondary | ICD-10-CM | POA: Diagnosis present

## 2013-07-11 DIAGNOSIS — Z992 Dependence on renal dialysis: Secondary | ICD-10-CM

## 2013-07-11 DIAGNOSIS — Z8673 Personal history of transient ischemic attack (TIA), and cerebral infarction without residual deficits: Secondary | ICD-10-CM

## 2013-07-11 DIAGNOSIS — D649 Anemia, unspecified: Secondary | ICD-10-CM

## 2013-07-11 DIAGNOSIS — R531 Weakness: Secondary | ICD-10-CM

## 2013-07-11 DIAGNOSIS — N039 Chronic nephritic syndrome with unspecified morphologic changes: Secondary | ICD-10-CM

## 2013-07-11 DIAGNOSIS — F0393 Unspecified dementia, unspecified severity, with mood disturbance: Secondary | ICD-10-CM

## 2013-07-11 DIAGNOSIS — F0280 Dementia in other diseases classified elsewhere without behavioral disturbance: Secondary | ICD-10-CM

## 2013-07-11 DIAGNOSIS — D696 Thrombocytopenia, unspecified: Secondary | ICD-10-CM

## 2013-07-11 DIAGNOSIS — E870 Hyperosmolality and hypernatremia: Secondary | ICD-10-CM | POA: Diagnosis present

## 2013-07-11 DIAGNOSIS — N186 End stage renal disease: Secondary | ICD-10-CM | POA: Diagnosis present

## 2013-07-11 DIAGNOSIS — M899 Disorder of bone, unspecified: Secondary | ICD-10-CM | POA: Diagnosis present

## 2013-07-11 DIAGNOSIS — I495 Sick sinus syndrome: Secondary | ICD-10-CM

## 2013-07-11 DIAGNOSIS — Z993 Dependence on wheelchair: Secondary | ICD-10-CM

## 2013-07-11 DIAGNOSIS — R7989 Other specified abnormal findings of blood chemistry: Secondary | ICD-10-CM | POA: Diagnosis present

## 2013-07-11 DIAGNOSIS — G92 Toxic encephalopathy: Secondary | ICD-10-CM | POA: Diagnosis present

## 2013-07-11 DIAGNOSIS — F0390 Unspecified dementia without behavioral disturbance: Secondary | ICD-10-CM

## 2013-07-11 DIAGNOSIS — I9589 Other hypotension: Secondary | ICD-10-CM

## 2013-07-11 DIAGNOSIS — R778 Other specified abnormalities of plasma proteins: Secondary | ICD-10-CM

## 2013-07-11 DIAGNOSIS — K219 Gastro-esophageal reflux disease without esophagitis: Secondary | ICD-10-CM

## 2013-07-11 DIAGNOSIS — IMO0002 Reserved for concepts with insufficient information to code with codable children: Secondary | ICD-10-CM | POA: Diagnosis present

## 2013-07-11 DIAGNOSIS — N2581 Secondary hyperparathyroidism of renal origin: Secondary | ICD-10-CM

## 2013-07-11 DIAGNOSIS — J811 Chronic pulmonary edema: Secondary | ICD-10-CM | POA: Diagnosis present

## 2013-07-11 DIAGNOSIS — R159 Full incontinence of feces: Secondary | ICD-10-CM | POA: Diagnosis present

## 2013-07-11 DIAGNOSIS — F32A Depression, unspecified: Secondary | ICD-10-CM

## 2013-07-11 DIAGNOSIS — F0151 Vascular dementia with behavioral disturbance: Secondary | ICD-10-CM | POA: Diagnosis present

## 2013-07-11 DIAGNOSIS — G929 Unspecified toxic encephalopathy: Secondary | ICD-10-CM | POA: Diagnosis present

## 2013-07-11 DIAGNOSIS — M129 Arthropathy, unspecified: Secondary | ICD-10-CM | POA: Diagnosis present

## 2013-07-11 DIAGNOSIS — R32 Unspecified urinary incontinence: Secondary | ICD-10-CM | POA: Diagnosis present

## 2013-07-11 DIAGNOSIS — G928 Other toxic encephalopathy: Secondary | ICD-10-CM | POA: Diagnosis present

## 2013-07-11 DIAGNOSIS — M949 Disorder of cartilage, unspecified: Secondary | ICD-10-CM

## 2013-07-11 DIAGNOSIS — I2489 Other forms of acute ischemic heart disease: Secondary | ICD-10-CM | POA: Diagnosis present

## 2013-07-11 DIAGNOSIS — Z66 Do not resuscitate: Secondary | ICD-10-CM

## 2013-07-11 DIAGNOSIS — F29 Unspecified psychosis not due to a substance or known physiological condition: Secondary | ICD-10-CM | POA: Diagnosis present

## 2013-07-11 DIAGNOSIS — R079 Chest pain, unspecified: Secondary | ICD-10-CM

## 2013-07-11 DIAGNOSIS — F0153 Vascular dementia, unspecified severity, with mood disturbance: Secondary | ICD-10-CM | POA: Diagnosis present

## 2013-07-11 DIAGNOSIS — E1165 Type 2 diabetes mellitus with hyperglycemia: Secondary | ICD-10-CM | POA: Diagnosis present

## 2013-07-11 DIAGNOSIS — I739 Peripheral vascular disease, unspecified: Secondary | ICD-10-CM

## 2013-07-11 DIAGNOSIS — I248 Other forms of acute ischemic heart disease: Secondary | ICD-10-CM | POA: Diagnosis present

## 2013-07-11 DIAGNOSIS — R6520 Severe sepsis without septic shock: Secondary | ICD-10-CM | POA: Diagnosis present

## 2013-07-11 DIAGNOSIS — D631 Anemia in chronic kidney disease: Secondary | ICD-10-CM | POA: Diagnosis present

## 2013-07-11 DIAGNOSIS — L89159 Pressure ulcer of sacral region, unspecified stage: Secondary | ICD-10-CM

## 2013-07-11 DIAGNOSIS — I672 Cerebral atherosclerosis: Secondary | ICD-10-CM | POA: Diagnosis present

## 2013-07-11 DIAGNOSIS — E0590 Thyrotoxicosis, unspecified without thyrotoxic crisis or storm: Secondary | ICD-10-CM | POA: Diagnosis present

## 2013-07-11 DIAGNOSIS — E1129 Type 2 diabetes mellitus with other diabetic kidney complication: Secondary | ICD-10-CM

## 2013-07-11 DIAGNOSIS — I120 Hypertensive chronic kidney disease with stage 5 chronic kidney disease or end stage renal disease: Secondary | ICD-10-CM | POA: Diagnosis present

## 2013-07-11 DIAGNOSIS — L89109 Pressure ulcer of unspecified part of back, unspecified stage: Secondary | ICD-10-CM

## 2013-07-11 DIAGNOSIS — E782 Mixed hyperlipidemia: Secondary | ICD-10-CM | POA: Diagnosis present

## 2013-07-11 DIAGNOSIS — Z7189 Other specified counseling: Secondary | ICD-10-CM

## 2013-07-11 LAB — COMPREHENSIVE METABOLIC PANEL
ALT: 11 U/L (ref 0–53)
AST: 17 U/L (ref 0–37)
Albumin: 2.2 g/dL — ABNORMAL LOW (ref 3.5–5.2)
Alkaline Phosphatase: 77 U/L (ref 39–117)
BUN: 52 mg/dL — AB (ref 6–23)
CO2: 31 mEq/L (ref 19–32)
Calcium: 10.9 mg/dL — ABNORMAL HIGH (ref 8.4–10.5)
Chloride: 106 mEq/L (ref 96–112)
Creatinine, Ser: 5.81 mg/dL — ABNORMAL HIGH (ref 0.50–1.35)
GFR calc non Af Amer: 8 mL/min — ABNORMAL LOW (ref >90)
GFR, EST AFRICAN AMERICAN: 10 mL/min — AB (ref >90)
GLUCOSE: 97 mg/dL (ref 70–99)
Potassium: 3.3 mEq/L — ABNORMAL LOW (ref 3.7–5.3)
Sodium: 150 mEq/L — ABNORMAL HIGH (ref 137–147)
TOTAL PROTEIN: 6 g/dL (ref 6.0–8.3)
Total Bilirubin: 0.9 mg/dL (ref 0.3–1.2)

## 2013-07-11 LAB — GLUCOSE, CAPILLARY: Glucose-Capillary: 90 mg/dL (ref 70–99)

## 2013-07-11 LAB — CBC WITH DIFFERENTIAL/PLATELET
BASOS ABS: 0 10*3/uL (ref 0.0–0.1)
Basophils Relative: 0 % (ref 0–1)
EOS PCT: 0 % (ref 0–5)
Eosinophils Absolute: 0 10*3/uL (ref 0.0–0.7)
HCT: 38.4 % — ABNORMAL LOW (ref 39.0–52.0)
Hemoglobin: 12.6 g/dL — ABNORMAL LOW (ref 13.0–17.0)
Lymphocytes Relative: 5 % — ABNORMAL LOW (ref 12–46)
Lymphs Abs: 0.7 10*3/uL (ref 0.7–4.0)
MCH: 28.6 pg (ref 26.0–34.0)
MCHC: 32.8 g/dL (ref 30.0–36.0)
MCV: 87.1 fL (ref 78.0–100.0)
Monocytes Absolute: 1.1 10*3/uL — ABNORMAL HIGH (ref 0.1–1.0)
Monocytes Relative: 8 % (ref 3–12)
NEUTROS ABS: 11.8 10*3/uL — AB (ref 1.7–7.7)
Neutrophils Relative %: 86 % — ABNORMAL HIGH (ref 43–77)
PLATELETS: 45 10*3/uL — AB (ref 150–400)
RBC: 4.41 MIL/uL (ref 4.22–5.81)
RDW: 16.2 % — AB (ref 11.5–15.5)
WBC: 13.6 10*3/uL — AB (ref 4.0–10.5)

## 2013-07-11 LAB — TROPONIN I: Troponin I: 0.62 ng/mL (ref <0.30)

## 2013-07-11 LAB — APTT: aPTT: 26 seconds (ref 24–37)

## 2013-07-11 LAB — CBG MONITORING, ED: Glucose-Capillary: 92 mg/dL (ref 70–99)

## 2013-07-11 LAB — LACTIC ACID, PLASMA: LACTIC ACID, VENOUS: 1.2 mmol/L (ref 0.5–2.2)

## 2013-07-11 LAB — PRO B NATRIURETIC PEPTIDE

## 2013-07-11 LAB — AMMONIA: AMMONIA: 47 umol/L (ref 11–60)

## 2013-07-11 LAB — TSH: TSH: 2.204 u[IU]/mL (ref 0.350–4.500)

## 2013-07-11 LAB — ETHANOL

## 2013-07-11 LAB — PROTIME-INR
INR: 1.23 (ref 0.00–1.49)
Prothrombin Time: 15.2 seconds (ref 11.6–15.2)

## 2013-07-11 LAB — PROCALCITONIN: PROCALCITONIN: 19.23 ng/mL

## 2013-07-11 MED ORDER — VANCOMYCIN HCL 10 G IV SOLR
1750.0000 mg | Freq: Once | INTRAVENOUS | Status: AC
  Administered 2013-07-11: 1750 mg via INTRAVENOUS
  Filled 2013-07-11 (×2): qty 1750

## 2013-07-11 MED ORDER — SODIUM CHLORIDE 0.9 % IV BOLUS (SEPSIS): 1000.0000 mL | Freq: Once | INTRAVENOUS | Status: DC

## 2013-07-11 MED ORDER — SODIUM CHLORIDE 0.45 % IV SOLN
INTRAVENOUS | Status: DC
  Administered 2013-07-11: 100 mL/h via INTRAVENOUS

## 2013-07-11 MED ORDER — CEFEPIME HCL 2 G IJ SOLR
2.0000 g | Freq: Once | INTRAMUSCULAR | Status: AC
  Administered 2013-07-11: 2 g via INTRAVENOUS
  Filled 2013-07-11 (×2): qty 2

## 2013-07-11 MED ORDER — INSULIN ASPART 100 UNIT/ML ~~LOC~~ SOLN: 0.0000 [IU] | SUBCUTANEOUS | Status: DC

## 2013-07-11 MED ORDER — ONDANSETRON HCL 4 MG/2ML IJ SOLN: 4.0000 mg | Freq: Four times a day (QID) | INTRAMUSCULAR | Status: DC | PRN

## 2013-07-11 MED ORDER — SODIUM CHLORIDE 0.9 % IV SOLN
INTRAVENOUS | Status: DC
Start: 2013-07-11 — End: 2013-07-11

## 2013-07-11 MED ORDER — ACETAMINOPHEN 650 MG RE SUPP: 650.0000 mg | Freq: Four times a day (QID) | RECTAL | Status: DC | PRN

## 2013-07-11 MED ORDER — SODIUM CHLORIDE 0.9 % IV BOLUS (SEPSIS)
500.0000 mL | Freq: Once | INTRAVENOUS | Status: AC
  Administered 2013-07-11: 500 mL via INTRAVENOUS

## 2013-07-11 MED ORDER — ONDANSETRON HCL 4 MG PO TABS: 4.0000 mg | ORAL_TABLET | Freq: Four times a day (QID) | ORAL | Status: DC | PRN

## 2013-07-11 MED ORDER — ACETAMINOPHEN 325 MG PO TABS: 650.0000 mg | ORAL_TABLET | Freq: Four times a day (QID) | ORAL | Status: DC | PRN

## 2013-07-11 MED ORDER — MORPHINE SULFATE 2 MG/ML IJ SOLN: 1.0000 mg | INTRAMUSCULAR | Status: DC | PRN

## 2013-07-11 MED ORDER — SODIUM CHLORIDE 0.9 % IV SOLN: INTRAVENOUS | Status: DC

## 2013-07-11 NOTE — Consult Note (Addendum)
ANTIBIOTIC CONSULT NOTE - INITIAL  Pharmacy Consult for Vancomycin and Cefepime Indication: rule out sepsis  No Known Allergies  Patient Measurements: Height: 5\' 6"  (167.6 cm) Weight: 158 lb 8.2 oz (71.9 kg) IBW/kg (Calculated) : 63.8  Vital Signs: Temp: 98.3 F (36.8 C) (02/23 1951) Temp src: Axillary (02/23 1951) BP: 90/33 mmHg (02/23 1951) Pulse Rate: 77 (02/23 1951) Intake/Output from previous day:   Intake/Output from this shift:    Labs:  Recent Labs  07/11/13 1300  WBC 13.6*  HGB 12.6*  PLT 45*  CREATININE 5.81*   Estimated Creatinine Clearance: 9.3 ml/min (by C-G formula based on Cr of 5.81).  Microbiology: Recent Results (from the past 720 hour(s))  MRSA PCR SCREENING     Status: None   Collection Time    07/01/13  1:57 PM      Result Value Ref Range Status   MRSA by PCR NEGATIVE  NEGATIVE Final   Comment:            The GeneXpert MRSA Assay (FDA     approved for NASAL specimens     only), is one component of a     comprehensive MRSA colonization     surveillance program. It is not     intended to diagnose MRSA     infection nor to guide or     monitor treatment for     MRSA infections.    Medical History: Past Medical History  Diagnosis Date  . Diabetes mellitus   . Hypertension   . Arthritis   . Cataract   . CAD (coronary artery disease)   . Secondary hyperthyroidism   . Dementia   . History of thrombocytopenia   . GERD (gastroesophageal reflux disease)   . Anxiety     paranoia  . Stroke   . Chronic kidney disease     Tue, Thurs, Sat [place] [place] [address]  . Anemia   . Psychosis   . Hypotension   . Cataract   . Delusional disorder   . Hyperlipidemia, mixed   . ESRD (end stage renal disease)    Assessment: 81yom from SNF presents to the ED with altered mental status and hypotension. He is ESRD on HD TTS and recently had a new AV fistula placed (07/01/13). He will begin vancomycin and cefepime for possible sepsis -  source new fistula versus decubitus wound.  Last HD session was Saturday 2/21. Family may stop HD altogether if patient continues to decline.  Goal of Therapy:  Pre-HD vancomycin level 15-25  Plan:  1) Vancomycin 1750mg  IV x 1 2) Cefepime 2g IV x 1 3) Follow up renal plans for further maintenance dosing  Fredrik RiggerMarkle, Jennifer Sue 07/11/2013,8:00 PM    07/12/2013 3:09 PM Patient is currently undergoing hemodialysis.  Will order antibiotics to continue post-procedure.  Plan:  1) Vancomycin 750mg  IV TTS with HD 2) Cefepime 2g IV TTS after each HD  Dartanian Knaggs, Pharm.D., BCPS Clinical Pharmacist Pager [phone] 07/12/2013 3:10 PM

## 2013-07-11 NOTE — ED Notes (Signed)
Family refusing to allow phlebotomy or RN to stick patient for second line or blood cultures.

## 2013-07-11 NOTE — ED Notes (Signed)
Son would like to be called when patient goes to floor: Reggie (son and POA)- [phone]

## 2013-07-11 NOTE — ED Notes (Signed)
[facility] and per family pt has had decreased LOc since Thursday had new dialysis graft in left thigh then last dialysis on sat ( cath In chest) feels warm to touch

## 2013-07-11 NOTE — H&P (Signed)
[facility] History and Physical  [name] XBM:[id number] DOB: 07/02/1931 DOA: 07/11/2013  Referring physician: Raymon Mutton, PA-C    PCP: Bufford Spikes, DO   Chief Complaint: Altered mental status  HPI: [name] is a 80 y.o. male  Past medical history of CAD, diabetes mellitus and end-stage renal disease who just had a fistula placed in the right thigh and dialysis catheter placed a few days ago who normally lives in a nursing facility and is able to ambulate, interactive and has a generally good quality life who for the last few days has been having decreased by mouth intake and less communication and then today was minimally responsive. He did indeed receive dialysis on Saturday. The patient was brought into the emergency room for further evaluation.  In the emergency room, patient was noted by blood work a sodium of 150, renal function consistent with end-stage renal disease, mild elevated troponin at 0.62 and a white count of 13.6. Chest x-ray no pulmonary edema but no signs of pneumonia. Patient is unable to make urine. He was noted to have a moderate sacral decubitus ulcer which had some whitish drainage. Patient was also noted to be hypotensive with a systolic blood pressure in the 80s. He was given IV fluids and blood .cultures were drawn. Hospitals were called for further evaluation  after critical care evaluated the patient and but deferred to the hospitalist service given DO NOT RESUSCITATE status   Review of Systems:  Patient not able to give me any kind of review of systems. He is minimal responsive and only grimaces to pain.   Past Medical History  Diagnosis Date  . Diabetes mellitus   . Hypertension   . Arthritis   . Cataract   . CAD (coronary artery disease)   . Secondary hyperthyroidism   . Dementia   . History of thrombocytopenia   . GERD (gastroesophageal reflux disease)   . Anxiety     paranoia  . Stroke   . Chronic kidney disease    Tue, Thurs, Sat dialysis, goes [address]  . Anemia   . Psychosis   . Hypotension   . Cataract   . Delusional disorder   . Hyperlipidemia, mixed   . ESRD (end stage renal disease)    Past Surgical History  Procedure Laterality Date  . Arteriovenous graft placement  05/01/10    Left upper arm brachial artery to axillary vein AVG  . Thrombectomy / arteriovenous graft revision  11/07/10    Left upper arm AVG  by Dr. Imogene Burn  . Diatek catheter insertion  12/05/10    Left side  . Bone spur      foot surgery  . Av fistula placement  04/14/2011    Procedure: ARTERIOVENOUS (AV) FISTULA CREATION;  Surgeon: Nilda Simmer, MD;  Location[facility] OR;  Service: Vascular;  Laterality: Right;   Second  stage basilic vein transposition  . Knee surgery Left   . Foot surgery Left   . Bunionectomy    . Ligation of arteriovenous  fistula Right 04/26/2013    Procedure: LIGATION OF ARTERIOVENOUS  FISTULA;  Surgeon: Sherren Kerns, MD;  Location: [facility] OR;  Service: Vascular;  Laterality: Right;  . Insertion of dialysis catheter Left 04/26/2013    Procedure: INSERTION OF DIALYSIS CATHETER;  Surgeon: Sherren Kerns, MD;  Location: [facility] OR;  Service: Vascular;  Laterality: Left;  . Exchange of a dialysis catheter Left 05/03/2013    Procedure: EXCHANGE OF A  DIALYSIS CATHETER;  Surgeon: Sherren Kerns, MD;  [facility];  Service: Vascular;  Laterality: Left;  . Av fistula placement Right 07/01/2013    Procedure: INSERTION OF ARTERIOVENOUS (AV) GORE-TEX GRAFT THIGH;  Surgeon: Chuck Hint, MD;  Location: [facility] OR;  Service: Vascular;  Laterality: Right;   Social History:  reports that he has never smoked. He has never used smokeless tobacco. He reports that he drinks alcohol. He reports that he does not use illicit drugs. patient lives in a nursing facility. He is normally able to ambulate using a walker. He is quite interactive and has a good appetite normally  No Known Allergies  Family  history: Patient cannot give me any family history: According to his son, diabetes runs in the family   Prior to Admission medications   Medication Sig Start Date End Date Taking? Authorizing Provider  acetaminophen (TYLENOL) 650 MG CR tablet Take 650 mg by mouth every 4 (four) hours as needed for pain or fever (and headache).   Yes Historical Provider, MD  Amino Acids-Protein Hydrolys (FEEDING SUPPLEMENT, PRO-STAT SUGAR FREE 64,) LIQD Take 30 mLs by mouth 2 (two) times daily.   Yes Historical Provider, MD  bisacodyl (DULCOLAX) 10 MG suppository Place 10 mg rectally daily as needed (for no bowel movement).    Yes Historical Provider, MD  calamine lotion Apply 1 application topically as needed for itching.   Yes Historical Provider, MD  calcium acetate (PHOSLO) 667 MG capsule Take 667 mg by mouth 3 (three) times daily with meals.    Yes Historical Provider, MD  cinacalcet (SENSIPAR) 90 MG tablet Take 180 mg by mouth daily at 12 noon.    Yes Historical Provider, MD  diphenhydrAMINE (BENADRYL) 25 MG tablet Take 25 mg by mouth every 8 (eight) hours as needed for itching.    Yes Historical Provider, MD  docusate sodium (COLACE) 100 MG capsule Take 200 mg by mouth at bedtime.    Yes Historical Provider, MD  escitalopram (LEXAPRO) 20 MG tablet Take 20 mg by mouth daily.   Yes Historical Provider, MD  lanthanum (FOSRENOL) 1000 MG chewable tablet Chew 1 tablet (1,000 mg total) by mouth 2 (two) times daily with a meal. 05/05/13  Yes Maryann Mikhail, DO  loperamide (IMODIUM) 2 MG capsule Take 2 mg by mouth as needed for diarrhea or loose stools.   Yes Historical Provider, MD  LORazepam (ATIVAN) 0.5 MG tablet Take 0.5 mg by mouth every 4 (four) hours as needed for anxiety.   Yes Historical Provider, MD  midodrine (PROAMATINE) 10 MG tablet Take 10 mg by mouth 3 (three) times daily.   Yes Historical Provider, MD  mirtazapine (REMERON) 7.5 MG tablet Take 7.5 mg by mouth at bedtime.   Yes Historical Provider, MD    multivitamin (RENA-VIT) TABS tablet Take 1 tablet by mouth daily.   Yes Historical Provider, MD  omeprazole (PRILOSEC) 20 MG capsule Take 20 mg by mouth daily.   Yes Historical Provider, MD  oxyCODONE (OXY IR/ROXICODONE) 5 MG immediate release tablet Take 1 tablet (5 mg total) by mouth 2 (two) times daily as needed for severe pain. 07/02/13  Yes Lars Mage, PA-C  risperiDONE (RISPERDAL) 0.5 MG tablet Take 0.5 mg by mouth at bedtime.   Yes Historical Provider, MD  simvastatin (ZOCOR) 20 MG tablet Take 20 mg by mouth at bedtime.    Yes Historical Provider, MD   Physical Exam: Filed Vitals:   07/11/13 1730  BP: 92/40  Pulse:  87  Temp: 98.5 F (36.9 C)  Resp: 26    BP 92/40  Pulse 87  Temp(Src) 98.5 F (36.9 C) (Oral)  Resp 26  SpO2 99%  Gen.: Minimally responsive, responds to pain, I stay closed HEENT: Normocephalic range maximum extremities are quite dry, left IJ catheter Cardiovascular: Regular rate and rhythm, borderline tachycardia Lungs: Decreased breath sounds at the bases, patient not follow commands for deep inspiratory effort Abdomen: Soft, nontender?, Nondistended, hypoactive bowel sounds Extremities: No clubbing or cyanosis, trace edema, right thigh graft noted Backside: Sacral decubitus ulcer with white drainage noticed           Labs on Admission:  Basic Metabolic Panel:  Recent Labs Lab 07/11/13 1300  NA 150*  K 3.3*  CL 106  CO2 31  GLUCOSE 97  BUN 52*  CREATININE 5.81*  CALCIUM 10.9*   Liver Function Tests:  Recent Labs Lab 07/11/13 1300  AST 17  ALT 11  ALKPHOS 77  BILITOT 0.9  PROT 6.0  ALBUMIN 2.2*   No results found for this basename: LIPASE, AMYLASE,  in the last 168 hours  Recent Labs Lab 07/11/13 1300  AMMONIA 47   CBC:  Recent Labs Lab 07/11/13 1300  WBC 13.6*  NEUTROABS 11.8*  HGB 12.6*  HCT 38.4*  MCV 87.1  PLT 45*   Cardiac Enzymes:  Recent Labs Lab 07/11/13 1300  TROPONINI 0.62*    BNP (last 3  results)  Recent Labs  07/11/13 1627  PROBNP >70000.0*   CBG:  Recent Labs Lab 07/11/13 1321  GLUCAP 92    Radiological Exams on Admission: Ct Head Wo Contrast  07/11/2013   CLINICAL DATA:  Decreased level of consciousness.  Weakness.  EXAM: CT HEAD WITHOUT CONTRAST  TECHNIQUE: Contiguous axial images were obtained from the base of the skull through the vertex without intravenous contrast.  COMPARISON:  CT head without contrast 03/26/2011  FINDINGS: Moderate generalized atrophy and white matter disease is similar to the prior study. A remote infarct of the left frontal lobe is again seen. Remote lacunar infarcts in the basal ganglia bilaterally are similar to the prior study.  No acute cortical infarct, hemorrhage, or mass lesion is present. The ventricles are proportionate to the degree of atrophy. No significant extra-axial fluid collection is present.  The paranasal sinuses are clear. There is some fluid in the dependent left mastoid air cells. The remaining paranasal sinuses are clear. Atherosclerotic calcifications are present within the cavernous carotid arteries bilaterally.  IMPRESSION: 1. Stable atrophy and diffuse white matter disease, advanced for age. This likely reflects the sequela of chronic microvascular ischemia. 2. Remote left frontal lobe infarct. 3. No acute intracranial abnormality. 4. Atherosclerosis.   Electronically Signed   By: Gennette Pac M.D.   On: 07/11/2013 13:44   Dg Chest Port 1 View  07/11/2013   CLINICAL DATA:  Lethargy.  EXAM: PORTABLE CHEST - 1 VIEW  COMPARISON:  05/04/2013  FINDINGS: Left central line tips in the region of mid to distal superior vena cava without evidence of gross pneumothorax.  Vascular stent mid to distal humerus level.  Pulmonary vascular congestion/pulmonary edema.  Consolidation left base unchanged. This may represent crowding of vessels/atelectasis secondary to cardiomegaly however, pleural effusion, infiltrate or mass not excluded.   IMPRESSION: Pulmonary vascular congestion/pulmonary edema.  Consolidation left base unchanged. This may represent crowding of vessels/atelectasis secondary to cardiomegaly however, pleural effusion, infiltrate or mass not excluded.   Electronically Signed   By: Kandice Hams.D.  On: 07/11/2013 12:47    EKG: Independently reviewed. Normal sinus rhythm with PVCs  Assessment/Plan Principal Problem:   Septic shock: Suspect source is decubitus wound, although does not look too bad. No evidence of pneumonia on chest x-ray. No reported diarrhea. Patient does not make urine. More worrisome source would be fistula or dialysis catheter graft, although he each was just placed a few days ago and do not look to show signs of inflammation or infection. Status post blood cultures. Have started vancomycin and Zosyn.  Place in the step down unit. We'll try to volume resuscitate. Active Problems:   Pulmonary edema: Have notified nephrology who can hopefully dialyze patient. He does not look to be to respiratory compromise, he may not be able to tolerate dialysis because of hypotension.   Hypotension, unspecified: Secondary sepsis   ESRD (end stage renal disease) on dialysis: The notified nephrology, will need dialysis in the next day or so. Hopefully he can tolerate.   Vascular dementia with depression: See below. Far from baseline.   Altered mental status: Although patient has dementia, normally he is ambulatory, talking and interactive and only occasionally forgetful.   Sacral decubitus ulcer, stage II: Have asked wound care to evaluate.    Hypernatremia: Secondary to dehydration Concentration. Has started half-normal saline   DM type 2, uncontrolled, with renal complications: N.p.o. while altered mental status. Sliding scale only    Code Status: DO NOT RESUSCITATE as confirmed by family.  Family Communication: Spoke with patient's son who is power of attorney and his wife at the bedside.  Disposition  Plan: Likely her for several days, hopefully patient will survive this event  Time spent: 45 minutes  Hollice EspyKRISHNAN,Trentin Knappenberger K [facility] Pager [phone]

## 2013-07-11 NOTE — ED Provider Notes (Signed)
CSN: [id number]     Arrival date & time 07/11/13  1058 History   First MD Initiated Contact with Patient 07/11/13 1107     Chief Complaint  Patient presents with  . Weakness     (Consider location/radiation/quality/duration/timing/severity/associated sxs/prior Treatment) The history is provided by a relative. No language interpreter was used.  [name] is a 80 y/o M with PMHx of CAD, stroke, arthritis, DM, HTN, dementia, anxiety, ESRD on dialysis (Tuesday, Thursday, and Saturday) presenting to the ED from [facility] - with sons - with AMS. Sons reported that the patient has been having decreased eating, drinking, has not been communicating with the staff or family since Thursday. Sons reported that the patient has gotten worse over the weekend. Sons reported that patient recently had surgery on Friday July 01, 2013 for placed of AV graft secondary to dialysis. Reported that patient has not been the same since then. Reported that he did go for dialysis on Saturday. Level 5 caveat.  PCP Dr. Renato Gails  Past Medical History  Diagnosis Date  . Diabetes mellitus   . Hypertension   . Arthritis   . Cataract   . CAD (coronary artery disease)   . Secondary hyperthyroidism   . Dementia   . History of thrombocytopenia   . GERD (gastroesophageal reflux disease)   . Anxiety     paranoia  . Stroke   . Chronic kidney disease     Tue, Thurs, Sat dialysis, goes to [address]  . Anemia   . Psychosis   . Hypotension   . Cataract   . Delusional disorder   . Hyperlipidemia, mixed   . ESRD (end stage renal disease)    Past Surgical History  Procedure Laterality Date  . Arteriovenous graft placement  05/01/10    Left upper arm brachial artery to axillary vein AVG  . Thrombectomy / arteriovenous graft revision  11/07/10    Left upper arm AVG  by Dr. Imogene Burn  . Diatek catheter insertion  12/05/10    Left side  . Bone spur      foot surgery  . Av fistula placement   04/14/2011    Procedure: ARTERIOVENOUS (AV) FISTULA CREATION;  Surgeon: Nilda Simmer, MD;  Location: [facility] OR;  Service: Vascular;  Laterality: Right;   Second  stage basilic vein transposition  . Knee surgery Left   . Foot surgery Left   . Bunionectomy    . Ligation of arteriovenous  fistula Right 04/26/2013    Procedure: LIGATION OF ARTERIOVENOUS  FISTULA;  Surgeon: Sherren Kerns, MD;  Location: [facility] OR;  Service: Vascular;  Laterality: Right;  . Insertion of dialysis catheter Left 04/26/2013    Procedure: INSERTION OF DIALYSIS CATHETER;  Surgeon: Sherren Kerns, MD;  Location: [facility] OR;  Service: Vascular;  Laterality: Left;  . Exchange of a dialysis catheter Left 05/03/2013    Procedure: EXCHANGE OF A DIALYSIS CATHETER;  Surgeon: Sherren Kerns, MD;  Location: [facility] OR;  Service: Vascular;  Laterality: Left;  . Av fistula placement Right 07/01/2013    Procedure: INSERTION OF ARTERIOVENOUS (AV) GORE-TEX GRAFT THIGH;  Surgeon: Chuck Hint, MD;  Location: [facility] OR;  Service: Vascular;  Laterality: Right;   No family history on file. History  Substance Use Topics  . Smoking status: Never Smoker   . Smokeless tobacco: Never Used  . Alcohol Use: Yes     Comment: approx 30 years ago    Review of Systems  Unable to perform ROS: Dementia      Allergies  Review of patient's allergies indicates no known allergies.  Home Medications   Current Outpatient Rx  Name  Route  Sig  Dispense  Refill  . acetaminophen (TYLENOL) 650 MG CR tablet   Oral   Take 650 mg by mouth every 4 (four) hours as needed for pain or fever (and headache).         . Amino Acids-Protein Hydrolys (FEEDING SUPPLEMENT, PRO-STAT SUGAR FREE 64,) LIQD   Oral   Take 30 mLs by mouth 2 (two) times daily.         . bisacodyl (DULCOLAX) 10 MG suppository   Rectal   Place 10 mg rectally daily as needed (for no bowel movement).          . calamine lotion   Topical   Apply 1 application topically as  needed for itching.         . calcium acetate (PHOSLO) 667 MG capsule   Oral   Take 667 mg by mouth 3 (three) times daily with meals.          . cinacalcet (SENSIPAR) 90 MG tablet   Oral   Take 180 mg by mouth daily at 12 noon.          . diphenhydrAMINE (BENADRYL) 25 MG tablet   Oral   Take 25 mg by mouth every 8 (eight) hours as needed for itching.          . docusate sodium (COLACE) 100 MG capsule   Oral   Take 200 mg by mouth at bedtime.          Marland Kitchen escitalopram (LEXAPRO) 20 MG tablet   Oral   Take 20 mg by mouth daily.         Marland Kitchen lanthanum (FOSRENOL) 1000 MG chewable tablet   Oral   Chew 1 tablet (1,000 mg total) by mouth 2 (two) times daily with a meal.         . loperamide (IMODIUM) 2 MG capsule   Oral   Take 2 mg by mouth as needed for diarrhea or loose stools.         Marland Kitchen LORazepam (ATIVAN) 0.5 MG tablet   Oral   Take 0.5 mg by mouth every 4 (four) hours as needed for anxiety.         . midodrine (PROAMATINE) 10 MG tablet   Oral   Take 10 mg by mouth 3 (three) times daily.         . mirtazapine (REMERON) 7.5 MG tablet   Oral   Take 7.5 mg by mouth at bedtime.         . multivitamin (RENA-VIT) TABS tablet   Oral   Take 1 tablet by mouth daily.         Marland Kitchen omeprazole (PRILOSEC) 20 MG capsule   Oral   Take 20 mg by mouth daily.         Marland Kitchen oxyCODONE (OXY IR/ROXICODONE) 5 MG immediate release tablet   Oral   Take 1 tablet (5 mg total) by mouth 2 (two) times daily as needed for severe pain.   30 tablet   0   . risperiDONE (RISPERDAL) 0.5 MG tablet   Oral   Take 0.5 mg by mouth at bedtime.         . simvastatin (ZOCOR) 20 MG tablet   Oral   Take 20 mg by mouth at bedtime.  BP 90/44  Pulse 79  Temp(Src) 98.9 F (37.2 C) (Rectal)  Resp 16  SpO2 98% Physical Exam  Nursing note and vitals reviewed. Constitutional: He appears well-developed and well-nourished. No distress.  HENT:  Head: Normocephalic and  atraumatic.  Mouth/Throat: No oropharyngeal exudate.  Dry mucus membranes  Eyes: Conjunctivae and EOM are normal. Pupils are equal, round, and reactive to light. Right eye exhibits no discharge. Left eye exhibits no discharge.  Negative nystagmus  Neck: Normal range of motion. Neck supple.  Cardiovascular: Normal rate, regular rhythm and normal heart sounds.  Exam reveals no friction rub.   No murmur heard. Pulses:      Radial pulses are 2+ on the right side, and 2+ on the left side.       Dorsalis pedis pulses are 2+ on the right side, and 2+ on the left side.  Cap refill < 3 seconds Negative swelling and pitting edema noted to bilateral lower extremities  Pulmonary/Chest: Effort normal. He has no wheezes. He has no rales.  Decreased breath sounds  Neurological:  When called patient's name he opens up his eyes Does not respond to questions Will not answer questions When asked to move right index finger patient is able to flex and extend the right index finger When asked to move left thumb patient did not move left thumb  Skin: Skin is warm and dry. No rash noted. He is not diaphoretic. No erythema.  Sacral decubitus ulcer noted to coccyx region with active drainage of thick white discharge - rather superficial.   Right upper thigh anterior aspect where new AV graft placed - incision site healing well with negative erythema, inflammation, lesions, sores, swelling or cellulitic findings noted.     ED Course  Procedures (including critical care time)  1:44 PM Patient's blood pressure 88/50 - another bolus of fluids ordered. Rectal temperature 98.33F.   After fluids IV administered patient's blood pressure continues to trend downwards, 84/48 - both this provider and attending physician found patient suitable for critical care.   2:17 PM This provider spoke with Critical Care - discussed case, history, presentation, labs, imaging, vitals. Critical care to see and assess patient.  2:51  PM Patient was seen and assessed by Critical Care.   3:42 PM Patient was seen and assessed by Critical Care - due to DNR and no central line to be placed patient to be admitted to Hospitalist on Medicine bed.   4:04 PM This provider spoke with Dr. Kirtland Bouchard. Sendil from [employer] - discussed case, history, labs, imaging results. Discussed plan for critical care for patient to be admitted to Medicine Unit for Medicine bed. Dr. Chancy Milroy to assess patient - admit to MedSurg as inpatient.   4:08 PM This provider discussed with the family the need to obtain blood cultures in order to find source of infection. Family agreed to blood cultures.   6:11 PM This provider received a phone call from Dr. Chancy Milroy who recommended that the antibiotics be started in the ED setting. Antibiotics to be started once dosage verified by pharmacy.   Results for orders placed during the hospital encounter of 07/11/13  CBC WITH DIFFERENTIAL      Result Value Ref Range   WBC 13.6 (*) 4.0 - 10.5 K/uL   RBC 4.41  4.22 - 5.81 MIL/uL   Hemoglobin 12.6 (*) 13.0 - 17.0 g/dL   HCT 16.1 (*) 09.6 - 04.5 %   MCV 87.1  78.0 - 100.0 fL  MCH 28.6  26.0 - 34.0 pg   MCHC 32.8  30.0 - 36.0 g/dL   RDW 16.1 (*) 09.6 - 04.5 %   Platelets 45 (*) 150 - 400 K/uL   Neutrophils Relative % 86 (*) 43 - 77 %   Neutro Abs 11.8 (*) 1.7 - 7.7 K/uL   Lymphocytes Relative 5 (*) 12 - 46 %   Lymphs Abs 0.7  0.7 - 4.0 K/uL   Monocytes Relative 8  3 - 12 %   Monocytes Absolute 1.1 (*) 0.1 - 1.0 K/uL   Eosinophils Relative 0  0 - 5 %   Eosinophils Absolute 0.0  0.0 - 0.7 K/uL   Basophils Relative 0  0 - 1 %   Basophils Absolute 0.0  0.0 - 0.1 K/uL  AMMONIA      Result Value Ref Range   Ammonia 47  11 - 60 umol/L  APTT      Result Value Ref Range   aPTT 26  24 - 37 seconds  COMPREHENSIVE METABOLIC PANEL      Result Value Ref Range   Sodium 150 (*) 137 - 147 mEq/L   Potassium 3.3 (*) 3.7 - 5.3 mEq/L   Chloride 106  96 - 112 mEq/L    CO2 31  19 - 32 mEq/L   Glucose, Bld 97  70 - 99 mg/dL   BUN 52 (*) 6 - 23 mg/dL   Creatinine, Ser 4.09 (*) 0.50 - 1.35 mg/dL   Calcium 81.1 (*) 8.4 - 10.5 mg/dL   Total Protein 6.0  6.0 - 8.3 g/dL   Albumin 2.2 (*) 3.5 - 5.2 g/dL   AST 17  0 - 37 U/L   ALT 11  0 - 53 U/L   Alkaline Phosphatase 77  39 - 117 U/L   Total Bilirubin 0.9  0.3 - 1.2 mg/dL   GFR calc non Af Amer 8 (*) >90 mL/min   GFR calc Af Amer 10 (*) >90 mL/min  ETHANOL      Result Value Ref Range   Alcohol, Ethyl (B) <11  0 - 11 mg/dL  LACTIC ACID, PLASMA      Result Value Ref Range   Lactic Acid, Venous 1.2  0.5 - 2.2 mmol/L  PROTIME-INR      Result Value Ref Range   Prothrombin Time 15.2  11.6 - 15.2 seconds   INR 1.23  0.00 - 1.49  TROPONIN I      Result Value Ref Range   Troponin I 0.62 (*) <0.30 ng/mL  CBG MONITORING, ED      Result Value Ref Range   Glucose-Capillary 92  70 - 99 mg/dL   Ct Head Wo Contrast  07/11/2013   CLINICAL DATA:  Decreased level of consciousness.  Weakness.  EXAM: CT HEAD WITHOUT CONTRAST  TECHNIQUE: Contiguous axial images were obtained from the base of the skull through the vertex without intravenous contrast.  COMPARISON:  CT head without contrast 03/26/2011  FINDINGS: Moderate generalized atrophy and white matter disease is similar to the prior study. A remote infarct of the left frontal lobe is again seen. Remote lacunar infarcts in the basal ganglia bilaterally are similar to the prior study.  No acute cortical infarct, hemorrhage, or mass lesion is present. The ventricles are proportionate to the degree of atrophy. No significant extra-axial fluid collection is present.  The paranasal sinuses are clear. There is some fluid in the dependent left mastoid air cells. The remaining paranasal sinuses are clear.  Atherosclerotic calcifications are present within the cavernous carotid arteries bilaterally.  IMPRESSION: 1. Stable atrophy and diffuse white matter disease, advanced for age. This  likely reflects the sequela of chronic microvascular ischemia. 2. Remote left frontal lobe infarct. 3. No acute intracranial abnormality. 4. Atherosclerosis.   Electronically Signed   By: Gennette Pachris  Mattern M.D.   On: 07/11/2013 13:44   Dg Chest Port 1 View  07/11/2013   CLINICAL DATA:  Lethargy.  EXAM: PORTABLE CHEST - 1 VIEW  COMPARISON:  05/04/2013  FINDINGS: Left central line tips in the region of mid to distal superior vena cava without evidence of gross pneumothorax.  Vascular stent mid to distal humerus level.  Pulmonary vascular congestion/pulmonary edema.  Consolidation left base unchanged. This may represent crowding of vessels/atelectasis secondary to cardiomegaly however, pleural effusion, infiltrate or mass not excluded.  IMPRESSION: Pulmonary vascular congestion/pulmonary edema.  Consolidation left base unchanged. This may represent crowding of vessels/atelectasis secondary to cardiomegaly however, pleural effusion, infiltrate or mass not excluded.   Electronically Signed   By: Bridgett LarssonSteve  Olson M.D.   On: 07/11/2013 12:47    Labs Review Labs Reviewed  CBC WITH DIFFERENTIAL - Abnormal; Notable for the following:    WBC 13.6 (*)    Hemoglobin 12.6 (*)    HCT 38.4 (*)    RDW 16.2 (*)    Platelets 45 (*)    Neutrophils Relative % 86 (*)    Neutro Abs 11.8 (*)    Lymphocytes Relative 5 (*)    Monocytes Absolute 1.1 (*)    All other components within normal limits  COMPREHENSIVE METABOLIC PANEL - Abnormal; Notable for the following:    Sodium 150 (*)    Potassium 3.3 (*)    BUN 52 (*)    Creatinine, Ser 5.81 (*)    Calcium 10.9 (*)    Albumin 2.2 (*)    GFR calc non Af Amer 8 (*)    GFR calc Af Amer 10 (*)    All other components within normal limits  TROPONIN I - Abnormal; Notable for the following:    Troponin I 0.62 (*)    All other components within normal limits  URINE CULTURE  CULTURE, BLOOD (ROUTINE X 2)  CULTURE, BLOOD (ROUTINE X 2)  AMMONIA  APTT  ETHANOL  LACTIC ACID,  PLASMA  PROTIME-INR  URINE RAPID DRUG SCREEN (HOSP PERFORMED)  URINALYSIS, ROUTINE W REFLEX MICROSCOPIC  TSH  TROPONIN I  TROPONIN I  PRO B NATRIURETIC PEPTIDE  CBG MONITORING, ED   Imaging Review Ct Head Wo Contrast  07/11/2013   CLINICAL DATA:  Decreased level of consciousness.  Weakness.  EXAM: CT HEAD WITHOUT CONTRAST  TECHNIQUE: Contiguous axial images were obtained from the base of the skull through the vertex without intravenous contrast.  COMPARISON:  CT head without contrast 03/26/2011  FINDINGS: Moderate generalized atrophy and white matter disease is similar to the prior study. A remote infarct of the left frontal lobe is again seen. Remote lacunar infarcts in the basal ganglia bilaterally are similar to the prior study.  No acute cortical infarct, hemorrhage, or mass lesion is present. The ventricles are proportionate to the degree of atrophy. No significant extra-axial fluid collection is present.  The paranasal sinuses are clear. There is some fluid in the dependent left mastoid air cells. The remaining paranasal sinuses are clear. Atherosclerotic calcifications are present within the cavernous carotid arteries bilaterally.  IMPRESSION: 1. Stable atrophy and diffuse white matter disease, advanced for age. This likely  reflects the sequela of chronic microvascular ischemia. 2. Remote left frontal lobe infarct. 3. No acute intracranial abnormality. 4. Atherosclerosis.   Electronically Signed   By: Gennette Pac M.D.   On: 07/11/2013 13:44   Dg Chest Port 1 View  07/11/2013   CLINICAL DATA:  Lethargy.  EXAM: PORTABLE CHEST - 1 VIEW  COMPARISON:  05/04/2013  FINDINGS: Left central line tips in the region of mid to distal superior vena cava without evidence of gross pneumothorax.  Vascular stent mid to distal humerus level.  Pulmonary vascular congestion/pulmonary edema.  Consolidation left base unchanged. This may represent crowding of vessels/atelectasis secondary to cardiomegaly however,  pleural effusion, infiltrate or mass not excluded.  IMPRESSION: Pulmonary vascular congestion/pulmonary edema.  Consolidation left base unchanged. This may represent crowding of vessels/atelectasis secondary to cardiomegaly however, pleural effusion, infiltrate or mass not excluded.   Electronically Signed   By: Bridgett Larsson M.D.   On: 07/11/2013 12:47    EKG Interpretation   None       MDM   Final diagnoses:  Altered mental status  Sacral pressure ulcer  Weakness  Pulmonary edema  ESRD on dialysis   Medications  sodium chloride 0.9 % bolus 500 mL (0 mLs Intravenous Stopped 07/11/13 1413)  sodium chloride 0.9 % bolus 500 mL (0 mLs Intravenous Stopped 07/11/13 1603)   Filed Vitals:   07/11/13 1500 07/11/13 1515 07/11/13 1530 07/11/13 1600  BP: 85/58 85/46 91/55  90/44  Pulse: 88 87 80 79  Temp:      TempSrc:      Resp: 14 30 25 16   SpO2: 98% 99% 99% 98%    Patient presenting to the ED from [facility] regarding decreased in mental status. As per son's who accompany their father, reported that the father has been having decrease in appetite, communication, drinking since Thursday has progressively gotten worse over the weekend. Reported that an AV fistula was placed in his right thigh on Friday, 07/01/2013-received dialysis on Saturday. Patient laying in bed. Eyes closed. When called patient's name he opens up his eyes for brief moment. Heart rate normal. Rhythm appears to be PVCs. Radial and DP pulses 2+ bilaterally. Upper thigh-right-noted incision for AV fistula identified, decreased vibration. Dry mucous membranes. PERRLA. Negative nystagmus. When asked patient to move right index finger patient is able to do so. When asked to move his left thumb patient did not follow. EKG noted PVCs-negative transitions ventricular tachycardia. Reported mildly elevated at 0.62-can be possible secondary to end-stage renal disease. CBC noted mild elevated white blood cell count  13.6-mildly elevated neutrophils of 11.8. Ammonia level negative elevation. APTT negative elevation. PT and INR negative elevation. Lactic acid negative elevation. Ethanol negative elevation. CMP noted hyper the trainee of 150, hypokalemia 3.3. BUN elevated at 52, creatinine 5.81. Chest x-ray noted pulmonary vascular congestion, pulmonary edema-new finding. Consolidation of the left lower lung base unchanged. CT head negative acute intracranial abnormalities - remote lacunar and left frontal lobe infarct, old. Due to patient's blood pressure trending down while in the ED setting - after controlled fluids - consulted Critical Care.This provider spoke with critical care regarding case, history, presentation, labs, imaging and vitals-discussed case in great detail. Critical care to come and assess patient. Critical care came and assessed patient since patient DNR recommended patient to be admitted to the Medicine floor. This provider discussed case in great detail with Dr. Chancy Milroy - patient to be admitted to the floor as inpatient for AMS, weakness, new  pulmonary edema and patient being ESRD. Patient's blood pressure going up and down - 1000 mL IV administer in ED setting. Patient to be admitted to the hospital. Blood cultures to be obtained.   Raymon Mutton, PA-C 07/12/13 [address], PA-C 07/12/13 [address] PA-C 07/12/13 1125

## 2013-07-11 NOTE — Consult Note (Signed)
Name: [name] MRN: [id number] DOB: 1931-06-17    ADMISSION DATE:  07/11/2013 CONSULTATION DATE:  07/11/2013   REFERRING MD :  EDP PRIMARY SERVICE: PCCM  CHIEF COMPLAINT:  Shock  BRIEF PATIENT DESCRIPTION: 80 yo male with ESRD, had AV fistula placed 2/13. Presents 2/23 with progressive lethargy since 2/19.   SIGNIFICANT EVENTS / STUDIES:  2/13 A/V fistula placed RLE 2/23 Head CT > no acute intracranial process, admitted to [facility]  LINES / TUBES: HD Permacath 05/02/2013 >>>  CULTURES: Blood 2/23 >>> Urine 2/23 >>>  ANTIBIOTICS: Per primary team  HISTORY OF PRESENT ILLNESS:  80 y.o. Male with history as below, which includes ESRD, CAD, CVA, and dementia. At baseline he is confined to a wheelcahir, but is able to control his wheelchair on his own and participate in activities at the SNF, where he resides.  He had an HD cath placed in December. He is on T,T,S HD schedule. Had an AV fistula placed 2/13, and has a history of slow to recover post-anesthesia. He was progressing as expected when on 2/19 he began to experienced increased lethargy and disorientation. Eventually on 2/23 he was unresponsive and was brought to the ED.  Patient had a goals of care meeting 2/20 at SNF and it was decided that he was to be a DNR at that time.   PAST MEDICAL HISTORY :  Past Medical History  Diagnosis Date  . Diabetes mellitus   . Hypertension   . Arthritis   . Cataract   . CAD (coronary artery disease)   . Secondary hyperthyroidism   . Dementia   . History of thrombocytopenia   . GERD (gastroesophageal reflux disease)   . Anxiety     paranoia  . Stroke   . Chronic kidney disease     Tue, Thurs, Sat dialysis, goes to [address]  . Anemia   . Psychosis   . Hypotension   . Cataract   . Delusional disorder   . Hyperlipidemia, mixed   . ESRD (end stage renal disease)    Past Surgical History  Procedure Laterality Date  . Arteriovenous graft placement  05/01/10    Left upper  arm brachial artery to axillary vein AVG  . Thrombectomy / arteriovenous graft revision  11/07/10    Left upper arm AVG  by Dr. Imogene Burn  . Diatek catheter insertion  12/05/10    Left side  . Bone spur      foot surgery  . Av fistula placement  04/14/2011    Procedure: ARTERIOVENOUS (AV) FISTULA CREATION;  Surgeon: Nilda Simmer, MD;  Location: [facility] OR;  Service: Vascular;  Laterality: Right;   Second  stage basilic vein transposition  . Knee surgery Left   . Foot surgery Left   . Bunionectomy    . Ligation of arteriovenous  fistula Right 04/26/2013    Procedure: LIGATION OF ARTERIOVENOUS  FISTULA;  Surgeon: Sherren Kerns, MD;  Location: [facility] OR;  Service: Vascular;  Laterality: Right;  . Insertion of dialysis catheter Left 04/26/2013    Procedure: INSERTION OF DIALYSIS CATHETER;  Surgeon: Sherren Kerns, MD;  Location: [facility] OR;  Service: Vascular;  Laterality: Left;  . Exchange of a dialysis catheter Left 05/03/2013    Procedure: EXCHANGE OF A DIALYSIS CATHETER;  Surgeon: Sherren Kerns, MD;  Location: [facility] OR;  Service: Vascular;  Laterality: Left;  . Av fistula placement Right 07/01/2013    Procedure: INSERTION OF ARTERIOVENOUS (AV) GORE-TEX GRAFT THIGH;  Surgeon: Chuck Hinthristopher S Dickson, MD;  [facility] OR;  Service: Vascular;  Laterality: Right;   Prior to Admission medications   Medication Sig Start Date End Date Taking? Authorizing Provider  acetaminophen (TYLENOL) 650 MG CR tablet Take 650 mg by mouth every 4 (four) hours as needed for pain or fever (and headache).   Yes Historical Provider, MD  Amino Acids-Protein Hydrolys (FEEDING SUPPLEMENT, PRO-STAT SUGAR FREE 64,) LIQD Take 30 mLs by mouth 2 (two) times daily.   Yes Historical Provider, MD  bisacodyl (DULCOLAX) 10 MG suppository Place 10 mg rectally daily as needed (for no bowel movement).    Yes Historical Provider, MD  calamine lotion Apply 1 application topically as needed for itching.   Yes Historical Provider, MD  calcium  acetate (PHOSLO) 667 MG capsule Take 667 mg by mouth 3 (three) times daily with meals.    Yes Historical Provider, MD  cinacalcet (SENSIPAR) 90 MG tablet Take 180 mg by mouth daily at 12 noon.    Yes Historical Provider, MD  diphenhydrAMINE (BENADRYL) 25 MG tablet Take 25 mg by mouth every 8 (eight) hours as needed for itching.    Yes Historical Provider, MD  docusate sodium (COLACE) 100 MG capsule Take 200 mg by mouth at bedtime.    Yes Historical Provider, MD  escitalopram (LEXAPRO) 20 MG tablet Take 20 mg by mouth daily.   Yes Historical Provider, MD  lanthanum (FOSRENOL) 1000 MG chewable tablet Chew 1 tablet (1,000 mg total) by mouth 2 (two) times daily with a meal. 05/05/13  Yes Maryann Mikhail, DO  loperamide (IMODIUM) 2 MG capsule Take 2 mg by mouth as needed for diarrhea or loose stools.   Yes Historical Provider, MD  LORazepam (ATIVAN) 0.5 MG tablet Take 0.5 mg by mouth every 4 (four) hours as needed for anxiety.   Yes Historical Provider, MD  midodrine (PROAMATINE) 10 MG tablet Take 10 mg by mouth 3 (three) times daily.   Yes Historical Provider, MD  mirtazapine (REMERON) 7.5 MG tablet Take 7.5 mg by mouth at bedtime.   Yes Historical Provider, MD  multivitamin (RENA-VIT) TABS tablet Take 1 tablet by mouth daily.   Yes Historical Provider, MD  omeprazole (PRILOSEC) 20 MG capsule Take 20 mg by mouth daily.   Yes Historical Provider, MD  oxyCODONE (OXY IR/ROXICODONE) 5 MG immediate release tablet Take 1 tablet (5 mg total) by mouth 2 (two) times daily as needed for severe pain. 07/02/13  Yes Lars MageEmma M Collins, PA-C  risperiDONE (RISPERDAL) 0.5 MG tablet Take 0.5 mg by mouth at bedtime.   Yes Historical Provider, MD  simvastatin (ZOCOR) 20 MG tablet Take 20 mg by mouth at bedtime.    Yes Historical Provider, MD   No Known Allergies  FAMILY HISTORY:  No family history on file. SOCIAL HISTORY:  reports that he has never smoked. He has never used smokeless tobacco. He reports that he drinks  alcohol. He reports that he does not use illicit drugs.  REVIEW OF SYSTEMS:  Difficult due to lethargy - some information gained from patients sons and EMR  Bolds are positive  Constitutional: weight loss, gain, night sweats, Fevers, chills, fatigue .  HEENT: headaches, Sore throat, sneezing, nasal congestion, post nasal drip, Difficulty swallowing, Tooth/dental problems, visual complaints visual changes, ear ache CV:  chest pain, radiates: ,Orthopnea, PND, swelling in lower extremities, dizziness, palpitations, syncope.  GI  heartburn, indigestion, abdominal pain, nausea, vomiting, diarrhea, change in bowel habits, loss of appetite, bloody stools.  Resp:  cough, productive: , hemoptysis, dyspnea, chest pain, pleuritic.  Skin: rash or itching or icterus GU: dysuria, change in color of urine, urgency or frequency. flank pain, hematuria  MS: joint pain or swelling. decreased range of motion  Psych: change in mood or affect. depression or anxiety.  Neuro: difficulty with speech, weakness, numbness, ataxia    SUBJECTIVE:   VITAL SIGNS: Temp:  [98.9 F (37.2 C)-99.4 F (37.4 C)] 98.9 F (37.2 C) (02/23 1331) Pulse Rate:  [65-88] 71 (02/23 1445) Resp:  [13-31] 15 (02/23 1445) BP: (84-125)/(35-84) 90/35 mmHg (02/23 1445) SpO2:  [86 %-100 %] 99 % (02/23 1445) HEMODYNAMICS:   VENTILATOR SETTINGS:   INTAKE / OUTPUT: Intake/Output   None     PHYSICAL EXAMINATION: General:  Lethargic elderly male of normal body habitus. Neuro:  Lethargic, disoriented, does not follow commands. HEENT:  [place]/AT, PERRL Cardiovascular:  RRR, 3/6 sM apex to base Lungs:  Rhonchi, diminished throughout. Abdomen:  Soft, non-distended Musculoskeletal:  Weak, no acute deformity. Skin:  Pressure ulcer to sacrum, stage 2.  LABS:  CBC  Recent Labs Lab 07/11/13 1300  WBC 13.6*  HGB 12.6*  HCT 38.4*  PLT 45*   Coag's  Recent Labs Lab 07/11/13 1300  APTT 26  INR 1.23   BMET  Recent Labs Lab  07/11/13 1300  NA 150*  K 3.3*  CL 106  CO2 31  BUN 52*  CREATININE 5.81*  GLUCOSE 97   Electrolytes  Recent Labs Lab 07/11/13 1300  CALCIUM 10.9*   Sepsis Markers  Recent Labs Lab 07/11/13 1300  LATICACIDVEN 1.2   ABG No results found for this basename: PHART, PCO2ART, PO2ART,  in the last 168 hours Liver Enzymes  Recent Labs Lab 07/11/13 1300  AST 17  ALT 11  ALKPHOS 77  BILITOT 0.9  ALBUMIN 2.2*   Cardiac Enzymes  Recent Labs Lab 07/11/13 1300  TROPONINI 0.62*   Glucose  Recent Labs Lab 07/11/13 1321  GLUCAP 92    Imaging Ct Head Wo Contrast  07/11/2013   CLINICAL DATA:  Decreased level of consciousness.  Weakness.  EXAM: CT HEAD WITHOUT CONTRAST  TECHNIQUE: Contiguous axial images were obtained from the base of the skull through the vertex without intravenous contrast.  COMPARISON:  CT head without contrast 03/26/2011  FINDINGS: Moderate generalized atrophy and white matter disease is similar to the prior study. A remote infarct of the left frontal lobe is again seen. Remote lacunar infarcts in the basal ganglia bilaterally are similar to the prior study.  No acute cortical infarct, hemorrhage, or mass lesion is present. The ventricles are proportionate to the degree of atrophy. No significant extra-axial fluid collection is present.  The paranasal sinuses are clear. There is some fluid in the dependent left mastoid air cells. The remaining paranasal sinuses are clear. Atherosclerotic calcifications are present within the cavernous carotid arteries bilaterally.  IMPRESSION: 1. Stable atrophy and diffuse white matter disease, advanced for age. This likely reflects the sequela of chronic microvascular ischemia. 2. Remote left frontal lobe infarct. 3. No acute intracranial abnormality. 4. Atherosclerosis.   Electronically Signed   By: Gennette Pac M.D.   On: 07/11/2013 13:44   Dg Chest Port 1 View  07/11/2013   CLINICAL DATA:  Lethargy.  EXAM: PORTABLE  CHEST - 1 VIEW  COMPARISON:  05/04/2013  FINDINGS: Left central line tips in the region of mid to distal superior vena cava without evidence of gross pneumothorax.  Vascular stent mid to distal humerus level.  Pulmonary vascular congestion/pulmonary edema.  Consolidation left base unchanged. This may represent crowding of vessels/atelectasis secondary to cardiomegaly however, pleural effusion, infiltrate or mass not excluded.  IMPRESSION: Pulmonary vascular congestion/pulmonary edema.  Consolidation left base unchanged. This may represent crowding of vessels/atelectasis secondary to cardiomegaly however, pleural effusion, infiltrate or mass not excluded.   Electronically Signed   By: Bridgett Larsson M.D.   On: 07/11/2013 12:47    CXR: 2/23 > Cardiomegaly, bilateral infiltrates - edema, fluid fissure  ASSESSMENT / PLAN:  A:  pulm edema At risk for poor airway protection.   P:   Supplemental O2 to keep SpO2 > 92% Does not want intubation per DNR on file and family wishes.  No NIMV, not a candidate risk aspiration  A:  Shock - ? Etiology potentially septic, cardiogenic ? NSTEMI ? CHF  No echo in EMR  R/o occult valvular dz, has MR murmur  P:  2D echo Gentle IVF to kvo with edema and concern cardiogenic issues Follow troponin F/u EKG in am (new changes lateral) Draw blood cultures from HD line if cleared by nephrology.   RENAL A:   ESRD on HD Fluid Volume Overload  P:   Patient/family plan to continue HD at this time, but are considering stopping in setting of further decline.   A:   Thrombocytopenia, dilution with chronic low baseline  P:  Monitor CBC No asa, no heparin  A:  Hx Secondary Hyperthyroidism  P:   Check TSH  NEUROLOGIC A:  Acute encephalopathy   P:   Monitor  TODAY'S SUMMARY: Unclear etiology to shock, but high clinical suspicion for cardiogenic shock. Check echo to confirm. Cardiac workup. Family has decided to make patient a No Code Blue at this time  in accordance with prior arrangements. Will pursue medical management, but will not initiate any extreme life saving procedures at this time such as CPR, central lines, and vasoactive drugs.   Comfort if fial Considering no further HD also, they want to d/w Renal I have personally obtained a history, examined the patient, evaluated laboratory and imaging results, formulated the assessment and plan and placed orders.  Joneen Roach, ACNP [facility] Pager [phone] or [phone]  07/11/2013, 3:00 PM  I have fully examined this patient and agree with above findings.    And edited in full  Mcarthur Rossetti. Tyson Alias, MD, FACP Pgr: [phone] [facility]

## 2013-07-12 DIAGNOSIS — G928 Other toxic encephalopathy: Secondary | ICD-10-CM | POA: Diagnosis present

## 2013-07-12 DIAGNOSIS — R7989 Other specified abnormal findings of blood chemistry: Secondary | ICD-10-CM

## 2013-07-12 DIAGNOSIS — R778 Other specified abnormalities of plasma proteins: Secondary | ICD-10-CM | POA: Diagnosis present

## 2013-07-12 DIAGNOSIS — R799 Abnormal finding of blood chemistry, unspecified: Secondary | ICD-10-CM

## 2013-07-12 DIAGNOSIS — G92 Toxic encephalopathy: Secondary | ICD-10-CM | POA: Diagnosis present

## 2013-07-12 LAB — CBC
HCT: 36.9 % — ABNORMAL LOW (ref 39.0–52.0)
HEMOGLOBIN: 12 g/dL — AB (ref 13.0–17.0)
MCH: 28.8 pg (ref 26.0–34.0)
MCHC: 32.5 g/dL (ref 30.0–36.0)
MCV: 88.5 fL (ref 78.0–100.0)
Platelets: 58 10*3/uL — ABNORMAL LOW (ref 150–400)
RBC: 4.17 MIL/uL — ABNORMAL LOW (ref 4.22–5.81)
RDW: 16.5 % — ABNORMAL HIGH (ref 11.5–15.5)
WBC: 13.7 10*3/uL — AB (ref 4.0–10.5)

## 2013-07-12 LAB — COMPREHENSIVE METABOLIC PANEL
ALT: 11 U/L (ref 0–53)
AST: 15 U/L (ref 0–37)
Albumin: 2.1 g/dL — ABNORMAL LOW (ref 3.5–5.2)
Alkaline Phosphatase: 75 U/L (ref 39–117)
BUN: 60 mg/dL — AB (ref 6–23)
CALCIUM: 10.6 mg/dL — AB (ref 8.4–10.5)
CO2: 26 mEq/L (ref 19–32)
CREATININE: 6.34 mg/dL — AB (ref 0.50–1.35)
Chloride: 106 mEq/L (ref 96–112)
GFR calc Af Amer: 9 mL/min — ABNORMAL LOW (ref >90)
GFR calc non Af Amer: 7 mL/min — ABNORMAL LOW (ref >90)
Glucose, Bld: 86 mg/dL (ref 70–99)
Potassium: 3.6 mEq/L — ABNORMAL LOW (ref 3.7–5.3)
Sodium: 149 mEq/L — ABNORMAL HIGH (ref 137–147)
TOTAL PROTEIN: 5.7 g/dL — AB (ref 6.0–8.3)
Total Bilirubin: 0.8 mg/dL (ref 0.3–1.2)

## 2013-07-12 LAB — GLUCOSE, CAPILLARY
GLUCOSE-CAPILLARY: 87 mg/dL (ref 70–99)
GLUCOSE-CAPILLARY: 89 mg/dL (ref 70–99)
Glucose-Capillary: 75 mg/dL (ref 70–99)
Glucose-Capillary: 77 mg/dL (ref 70–99)
Glucose-Capillary: 84 mg/dL (ref 70–99)
Glucose-Capillary: 91 mg/dL (ref 70–99)
Glucose-Capillary: 92 mg/dL (ref 70–99)

## 2013-07-12 LAB — MRSA PCR SCREENING: MRSA by PCR: INVALID — AB

## 2013-07-12 LAB — TROPONIN I
TROPONIN I: 0.47 ng/mL — AB (ref <0.30)
Troponin I: 0.54 ng/mL (ref <0.30)

## 2013-07-12 LAB — HEMOGLOBIN A1C
HEMOGLOBIN A1C: 5.1 % (ref <5.7)
MEAN PLASMA GLUCOSE: 100 mg/dL (ref <117)

## 2013-07-12 MED ORDER — DEXTROSE-NACL 5-0.45 % IV SOLN: INTRAVENOUS | Status: DC

## 2013-07-12 MED ORDER — SODIUM CHLORIDE 0.9 % IV SOLN: 100.0000 mL | INTRAVENOUS | Status: DC | PRN

## 2013-07-12 MED ORDER — LIDOCAINE-PRILOCAINE 2.5-2.5 % EX CREA
1.0000 "application " | TOPICAL_CREAM | CUTANEOUS | Status: DC | PRN
  Filled 2013-07-12: qty 5

## 2013-07-12 MED ORDER — PENTAFLUOROPROP-TETRAFLUOROETH EX AERO: 1.0000 "application " | INHALATION_SPRAY | CUTANEOUS | Status: DC | PRN

## 2013-07-12 MED ORDER — NEPRO/CARBSTEADY PO LIQD
237.0000 mL | ORAL | Status: DC | PRN
  Filled 2013-07-12: qty 237

## 2013-07-12 MED ORDER — MIDODRINE HCL 5 MG PO TABS
10.0000 mg | ORAL_TABLET | ORAL | Status: DC
  Administered 2013-07-14: 10 mg via ORAL
  Filled 2013-07-12 (×9): qty 2

## 2013-07-12 MED ORDER — DEXTROSE 5 % IV SOLN
2.0000 g | INTRAVENOUS | Status: DC
  Administered 2013-07-12: 2 g via INTRAVENOUS
  Filled 2013-07-12 (×3): qty 2

## 2013-07-12 MED ORDER — DEXTROSE 5 % IV SOLN
INTRAVENOUS | Status: DC
  Administered 2013-07-12: 75 mL via INTRAVENOUS
  Administered 2013-07-14: 50 mL via INTRAVENOUS

## 2013-07-12 MED ORDER — ENOXAPARIN SODIUM 30 MG/0.3ML ~~LOC~~ SOLN
30.0000 mg | SUBCUTANEOUS | Status: DC
  Administered 2013-07-13: 30 mg via SUBCUTANEOUS
  Filled 2013-07-12 (×3): qty 0.3

## 2013-07-12 MED ORDER — HEPARIN SODIUM (PORCINE) 1000 UNIT/ML DIALYSIS: 1000.0000 [IU] | INTRAMUSCULAR | Status: DC | PRN

## 2013-07-12 MED ORDER — LIDOCAINE HCL (PF) 1 % IJ SOLN: 5.0000 mL | INTRAMUSCULAR | Status: DC | PRN

## 2013-07-12 MED ORDER — VANCOMYCIN HCL IN DEXTROSE 750-5 MG/150ML-% IV SOLN
750.0000 mg | INTRAVENOUS | Status: DC
  Administered 2013-07-12: 750 mg via INTRAVENOUS
  Filled 2013-07-12 (×2): qty 150

## 2013-07-12 MED ORDER — HEPARIN SODIUM (PORCINE) 1000 UNIT/ML DIALYSIS
2600.0000 [IU] | Freq: Once | INTRAMUSCULAR | Status: AC
  Administered 2013-07-12: 2600 [IU] via INTRAVENOUS_CENTRAL

## 2013-07-12 MED ORDER — ALTEPLASE 2 MG IJ SOLR
2.0000 mg | Freq: Once | INTRAMUSCULAR | Status: DC | PRN
  Filled 2013-07-12: qty 2

## 2013-07-12 NOTE — Procedures (Signed)
I was present at this dialysis session, have reviewed the session itself and made  appropriate changes  Rob Eriona Kinchen MD (pgr) [phone]    (c) [phone] 07/12/2013, 2:53 PM   

## 2013-07-12 NOTE — Consult Note (Signed)
WOC wound consult note Reason for Consult:Sacral ulcer:  Suspected Deep Tissue Injury (sDTI).  All other skin intact (heels, ischial tuberosities, etc.) Maceration from moisture collecting in periarea noted. Wound type:Pressure Pressure Ulcer POA: Yes Measurement: 9cm x 7cm, triangular shaped non-blanching affected area with dark, discolored area at apex measuring 3cm x 2.5cm. Scattered pinpoint areas of partial thickness tissue loss in the distal portion of the Triangular space from moisture-related tissue damage. Wound WGN:FAOZHbed:Clean, pink tissue from moisture associated skin damage.  Non-blanching erythema in triangular-shaped injury with dark maroon area at apex that is not open, but where sDTI is located. Drainage (amount, consistency, odor) Small serous from partial thickness open areas Periwound:intact, dry Dressing procedure/placement/frequency:Sacral soft silicone foam dressing placed.  A therapeutic mattress is ordered and bilateral Prevalon boots are orederd to protect his heels. WOC nursing team will not follow routinely, but will remain available to this patient, the nursing and medical teams.  Please re-consult if needed i.e., if wound deteriorates during admission despite efforts to offload and treat. Thanks, Ladona MowLaurie Lesly Joslyn, MSN, RN, GNP, [place], CWON-AP [phone])

## 2013-07-12 NOTE — Progress Notes (Signed)
[facility] PROGRESS NOTE  Lasandra BeechJoseph N Howden OZH:[id number] DOB: [birth year] DOA: 07/11/2013 PCP: Bufford SpikesEED, TIFFANY, DO  Assessment/Plan: Principal Problem:   Septic shock:  White blood cell count unchanged, pressures a little bit better. Patient clinically minimally improved from yesterday in that he is more awake, but not alert or interactive (baseline is ambulatory and talkative with occasional confusion). Source may be sacral wound. No evidence of pneumonia, although chest x-ray obscured with pulmonary edema. Fistula and, old IJ dialysis catheter looks okay with no evidence of drainage, erythema. Patient does not make urine. Continue antibiotics. Monitor blood cultures. Await wound cultures. Active Problems:   Pulmonary edema: volume overloaded. Hopefully can get dialysis today.    Hypotension, unspecified: Slight improvement. Secondary to shock.    ESRD (end stage renal disease) on dialysis: Nephrology consulted. Hopefully can tolerate dialysis today with pressures being better. His normal dialysis schedule is Tuesday, Thursday, Saturday.    Vascular dementia with depression   Altered mental status/toxic metabolic encephalopathy: As above, baseline is ambulatory and interactive. Slightly more awake, but not really responsive. Felt to be secondary to toxic metabolic encephalopathy from shock.    Sacral decubitus ulcer, stage II: Appreciate wound care help. Dressing changes noted as well as air mattress overlay    Hypernatremia: Secondary to shock and concentration. Slight improvement today in sodium, continue half-normal saline.    DM type 2, uncontrolled, with renal complications: CBG range 77-90. The patient's mentation does not improve by tomorrow, will need to consider tube feedings. We'll need to discuss with son.    Elevated troponin: Given renal dysfunction, minimal elevation hard to attribute to cardiac. Likely he may have some demand ischemia from shock. Troponins are trending down  today although unclear if because hypotension better  Code Status: DO NOT RESUSCITATE  Family Communication: Spoke with the son, Tiburcio BashReginald, power of attorney by phone  Disposition Plan: Continue step down   Consultants:  Wound care  Nephrology  Procedures:  For dialysis, possibly later today and  Antibiotics:  IV vancomycin and Zosyn: 2/23-present   HPI/Subjective: Patient seen in step down unit. Slightly more awake, but otherwise does not interact. Does not follow commands.  Objective: Filed Vitals:   07/12/13 0831  BP: 103/54  Pulse: 94  Temp: 98 F (36.7 C)  Resp: 31   No intake or output data in the 24 hours ending 07/12/13 0925 Filed Weights   07/11/13 1920 07/11/13 2001 07/12/13 0350  Weight: 71.9 kg (158 lb 8.2 oz) 71.9 kg (158 lb 8.2 oz) 71.9 kg (158 lb 8.2 oz)    Exam:   General:  Awake, but otherwise does not really responders interactive  Cardiovascular: Regular rate and rhythm, S1-S2  Chest: Left-sided tunneled IJ catheter noted. No evidence of erythema or drainage.  Respiratory: Decreased breath sounds bibasilar, midway down  Abdomen: Soft, nontender?, Nondistended, hypoactive bowel sounds  Musculoskeletal: No clubbing or cyanosis, trace edema. Graft noted, no evidence of infection.   Data Reviewed: Basic Metabolic Panel:  Recent Labs Lab 07/11/13 1300 07/12/13 0132  NA 150* 149*  K 3.3* 3.6*  CL 106 106  CO2 31 26  GLUCOSE 97 86  BUN 52* 60*  CREATININE 5.81* 6.34*  CALCIUM 10.9* 10.6*   Liver Function Tests:  Recent Labs Lab 07/11/13 1300 07/12/13 0132  AST 17 15  ALT 11 11  ALKPHOS 77 75  BILITOT 0.9 0.8  PROT 6.0 5.7*  ALBUMIN 2.2* 2.1*   No results found for this basename: LIPASE, AMYLASE,  in the last 168 hours  Recent Labs Lab 07/11/13 1300  AMMONIA 47   CBC:  Recent Labs Lab 07/11/13 1300 07/12/13 0132  WBC 13.6* 13.7*  NEUTROABS 11.8*  --   HGB 12.6* 12.0*  HCT 38.4* 36.9*  MCV 87.1 88.5  PLT  45* 58*   Cardiac Enzymes:  Recent Labs Lab 07/11/13 1300 07/12/13 0023 07/12/13 0132  TROPONINI 0.62* 0.54* 0.47*   BNP (last 3 results)  Recent Labs  07/11/13 1627  PROBNP >70000.0*   CBG:  Recent Labs Lab 07/11/13 1321 07/11/13 2057 07/11/13 2359 07/12/13 0349 07/12/13 0835  GLUCAP 92 90 91 75 77    Recent Results (from the past 240 hour(s))  MRSA PCR SCREENING     Status: Abnormal   Collection Time    07/11/13  7:37 PM      Result Value Ref Range Status   MRSA by PCR INVALID RESULTS, SPECIMEN SENT FOR CULTURE (*) NEGATIVE Final   Comment: RESULT CALLED TO, READ BACK BY AND VERIFIED WITH:     THOMAS,T RN 1610 07/12/13 MITCHELL,L                The GeneXpert MRSA Assay (FDA     approved for NASAL specimens     only), is one component of a     comprehensive MRSA colonization     surveillance program. It is not     intended to diagnose MRSA     infection nor to guide or     monitor treatment for     MRSA infections.     Studies: Ct Head Wo Contrast  07/11/2013   CLINICAL DATA:  Decreased level of consciousness.  Weakness.  EXAM: CT HEAD WITHOUT CONTRAST  TECHNIQUE: Contiguous axial images were obtained from the base of the skull through the vertex without intravenous contrast.  COMPARISON:  CT head without contrast 03/26/2011  FINDINGS: Moderate generalized atrophy and white matter disease is similar to the prior study. A remote infarct of the left frontal lobe is again seen. Remote lacunar infarcts in the basal ganglia bilaterally are similar to the prior study.  No acute cortical infarct, hemorrhage, or mass lesion is present. The ventricles are proportionate to the degree of atrophy. No significant extra-axial fluid collection is present.  The paranasal sinuses are clear. There is some fluid in the dependent left mastoid air cells. The remaining paranasal sinuses are clear. Atherosclerotic calcifications are present within the cavernous carotid arteries  bilaterally.  IMPRESSION: 1. Stable atrophy and diffuse white matter disease, advanced for age. This likely reflects the sequela of chronic microvascular ischemia. 2. Remote left frontal lobe infarct. 3. No acute intracranial abnormality. 4. Atherosclerosis.   Electronically Signed   By: Gennette Pac M.D.   On: 07/11/2013 13:44   Dg Chest Port 1 View  07/11/2013   CLINICAL DATA:  Lethargy.  EXAM: PORTABLE CHEST - 1 VIEW  COMPARISON:  05/04/2013  FINDINGS: Left central line tips in the region of mid to distal superior vena cava without evidence of gross pneumothorax.  Vascular stent mid to distal humerus level.  Pulmonary vascular congestion/pulmonary edema.  Consolidation left base unchanged. This may represent crowding of vessels/atelectasis secondary to cardiomegaly however, pleural effusion, infiltrate or mass not excluded.  IMPRESSION: Pulmonary vascular congestion/pulmonary edema.  Consolidation left base unchanged. This may represent crowding of vessels/atelectasis secondary to cardiomegaly however, pleural effusion, infiltrate or mass not excluded.   Electronically Signed   By: Kandice Hams.D.  On: 07/11/2013 12:47    Scheduled Meds: . insulin aspart  0-9 Units Subcutaneous 6 times per day   Continuous Infusions: . sodium chloride 100 mL/hr (07/11/13 2045)    Principal Problem:   Septic shock Active Problems:   Pulmonary edema   Hypotension, unspecified   ESRD (end stage renal disease) on dialysis   Vascular dementia with depression   Altered mental status   Sacral decubitus ulcer, stage II   Hypernatremia   DM type 2, uncontrolled, with renal complications   Elevated troponin    Time spent: 35 minutes    Hollice Espy  [facility] Pager [phone]. If 7PM-7AM, please contact night-coverage at [email or web address], password [facility] 07/12/2013, 9:25 AM  LOS: 1 day

## 2013-07-12 NOTE — Progress Notes (Signed)
  Speech Pathology Cancellation Note  [facility] Will f/u in am tomorrow.   Harlon DittyBonnie Sedrick Tober, MA CCC-SLP [phone]

## 2013-07-12 NOTE — ED Provider Notes (Addendum)
Medical screening examination/treatment/procedure(s) were performed by non-physician practitioner and as supervising physician I was immediately available for consultation/collaboration.   EKG Interpretation   Date/Time:  Monday July 11 2013 12:14:54 EST Ventricular Rate:  80 PR Interval:  199 QRS Duration: 119 QT Interval:  441 QTC Calculation: 509 R Axis:   168 Text Interpretation:  Sinus rhythm Multiform ventricular premature  complexes Nonspecific intraventricular conduction delay Probable  anteroseptal infarct, old Nonspecific T abnormalities, lateral leads ED  PHYSICIAN INTERPRETATION AVAILABLE IN [facility] Confirmed by TEST,  RECORD ([id number]) on 07/13/2013 8:57:27 AM             Ethelda ChickMartha K Linker, MD 07/12/13 1130  Ethelda ChickMartha K Linker, MD 07/20/13 [phone]

## 2013-07-12 NOTE — Progress Notes (Addendum)
Pt just left floor with dialysis RN Trey PaulaJeff Bond. Pt will be expected to be there for 4hr and 15 minutes. Placed pt on specialty bed this am. Placed prevalon boots on pt this am. Per wound care orders. Pt is still altered mental status with un-comprehensible sounds at times. Pt is contracted in a fetal position and even after repositioned goes back to fetal position. Vitals have been within normal limits. Will continue to monitor. Notified central telemetry.

## 2013-07-12 NOTE — Progress Notes (Signed)
Pt just got back from dialysis. Pt resting in room.

## 2013-07-12 NOTE — Progress Notes (Addendum)
CRITICAL VALUE ALERT  Critical value received:  Gram positive blood culture and gram neg. rod  Date of notification:  07/12/13  Time of notification:  2010  Critical value read back: Nurse who received alert: Samia Kukla RN/Amanda RN  MD notified (1st page):  Kirtland BouchardK. Schorr  Time of first page: 2010  MD notified (2nd page):  Time of second page:  Responding MD:    Time MD responded:

## 2013-07-12 NOTE — Consult Note (Addendum)
Indication for Consultation:  Management of ESRD/hemodialysis; anemia, hypertension/volume and secondary hyperparathyroidism  HPI: [name] is a 80 y.o. male admitted for eval of decreased LOC. He receives HD TTS @[facility] . Per history he has had progressive lethargy since 2/19, he had his R right AVG placed 2/13. He was brought to ED 2/23 with increased confusion and was unresponsive.  Past Medical History  Diagnosis Date  . Diabetes mellitus   . Hypertension   . Arthritis   . Cataract   . CAD (coronary artery disease)   . Secondary hyperthyroidism   . Dementia   . History of thrombocytopenia   . GERD (gastroesophageal reflux disease)   . Anxiety     paranoia  . Stroke   . Chronic kidney disease     Tue, Thurs, Sat dialysis, goes to [address]  . Anemia   . Psychosis   . Hypotension   . Cataract   . Delusional disorder   . Hyperlipidemia, mixed   . ESRD (end stage renal disease)    Past Surgical History  Procedure Laterality Date  . Arteriovenous graft placement  05/01/10    Left upper arm brachial artery to axillary vein AVG  . Thrombectomy / arteriovenous graft revision  11/07/10    Left upper arm AVG  by Dr. Imogene Burn  . Diatek catheter insertion  12/05/10    Left side  . Bone spur      foot surgery  . Av fistula placement  04/14/2011    Procedure: ARTERIOVENOUS (AV) FISTULA CREATION;  Surgeon: Nilda Simmer, MD;  Location: [facility] OR;  Service: Vascular;  Laterality: Right;   Second  stage basilic vein transposition  . Knee surgery Left   . Foot surgery Left   . Bunionectomy    . Ligation of arteriovenous  fistula Right 04/26/2013    Procedure: LIGATION OF ARTERIOVENOUS  FISTULA;  Surgeon: Sherren Kerns, MD;  Location: [facility] OR;  Service: Vascular;  Laterality: Right;  . Insertion of dialysis catheter Left 04/26/2013    Procedure: INSERTION OF DIALYSIS CATHETER;  Surgeon: Sherren Kerns, MD;  Location: [facility] OR;  Service: Vascular;  Laterality: Left;  . Exchange of  a dialysis catheter Left 05/03/2013    Procedure: EXCHANGE OF A DIALYSIS CATHETER;  Surgeon: Sherren Kerns, MD;  Location: [facility] OR;  Service: Vascular;  Laterality: Left;  . Av fistula placement Right 07/01/2013    Procedure: INSERTION OF ARTERIOVENOUS (AV) GORE-TEX GRAFT THIGH;  Surgeon: Chuck Hint, MD;  Location: [facility] OR;  Service: Vascular;  Laterality: Right;   No family history on file. Social History:  reports that he has never smoked. He has never used smokeless tobacco. He reports that he drinks alcohol. He reports that he does not use illicit drugs. No Known Allergies Prior to Admission medications   Medication Sig Start Date End Date Taking? Authorizing Provider  acetaminophen (TYLENOL) 650 MG CR tablet Take 650 mg by mouth every 4 (four) hours as needed for pain or fever (and headache).   Yes Historical Provider, MD  Amino Acids-Protein Hydrolys (FEEDING SUPPLEMENT, PRO-STAT SUGAR FREE 64,) LIQD Take 30 mLs by mouth 2 (two) times daily.   Yes Historical Provider, MD  bisacodyl (DULCOLAX) 10 MG suppository Place 10 mg rectally daily as needed (for no bowel movement).    Yes Historical Provider, MD  calamine lotion Apply 1 application topically as needed for itching.   Yes Historical Provider, MD  calcium acetate (PHOSLO) 667 MG capsule Take  667 mg by mouth 3 (three) times daily with meals.    Yes Historical Provider, MD  cinacalcet (SENSIPAR) 90 MG tablet Take 180 mg by mouth daily at 12 noon.    Yes Historical Provider, MD  diphenhydrAMINE (BENADRYL) 25 MG tablet Take 25 mg by mouth every 8 (eight) hours as needed for itching.    Yes Historical Provider, MD  docusate sodium (COLACE) 100 MG capsule Take 200 mg by mouth at bedtime.    Yes Historical Provider, MD  escitalopram (LEXAPRO) 20 MG tablet Take 20 mg by mouth daily.   Yes Historical Provider, MD  lanthanum (FOSRENOL) 1000 MG chewable tablet Chew 1 tablet (1,000 mg total) by mouth 2 (two) times daily with a meal.  05/05/13  Yes Maryann Mikhail, DO  loperamide (IMODIUM) 2 MG capsule Take 2 mg by mouth as needed for diarrhea or loose stools.   Yes Historical Provider, MD  LORazepam (ATIVAN) 0.5 MG tablet Take 0.5 mg by mouth every 4 (four) hours as needed for anxiety.   Yes Historical Provider, MD  midodrine (PROAMATINE) 10 MG tablet Take 10 mg by mouth 3 (three) times daily.   Yes Historical Provider, MD  mirtazapine (REMERON) 7.5 MG tablet Take 7.5 mg by mouth at bedtime.   Yes Historical Provider, MD  multivitamin (RENA-VIT) TABS tablet Take 1 tablet by mouth daily.   Yes Historical Provider, MD  omeprazole (PRILOSEC) 20 MG capsule Take 20 mg by mouth daily.   Yes Historical Provider, MD  oxyCODONE (OXY IR/ROXICODONE) 5 MG immediate release tablet Take 1 tablet (5 mg total) by mouth 2 (two) times daily as needed for severe pain. 07/02/13  Yes Lars Mage, PA-C  risperiDONE (RISPERDAL) 0.5 MG tablet Take 0.5 mg by mouth at bedtime.   Yes Historical Provider, MD  simvastatin (ZOCOR) 20 MG tablet Take 20 mg by mouth at bedtime.    Yes Historical Provider, MD   Current Facility-Administered Medications  Medication Dose Route Frequency Provider Last Rate Last Dose  . acetaminophen (TYLENOL) tablet 650 mg  650 mg Oral Q6H PRN Hollice Espy, MD       Or  . acetaminophen (TYLENOL) suppository 650 mg  650 mg Rectal Q6H PRN Hollice Espy, MD      . dextrose 5 % solution   Intravenous Continuous Hollice Espy, MD      . insulin aspart (novoLOG) injection 0-9 Units  0-9 Units Subcutaneous 6 times per day Hollice Espy, MD      . morphine 2 MG/ML injection 1 mg  1 mg Intravenous Q4H PRN Hollice Espy, MD      . ondansetron (ZOFRAN) tablet 4 mg  4 mg Oral Q6H PRN Hollice Espy, MD       Or  . ondansetron (ZOFRAN) injection 4 mg  4 mg Intravenous Q6H PRN Hollice Espy, MD       Labs: Basic Metabolic Panel:  Recent Labs Lab 07/11/13 1300 07/12/13 0132  NA 150* 149*  K 3.3* 3.6*   CL 106 106  CO2 31 26  GLUCOSE 97 86  BUN 52* 60*  CREATININE 5.81* 6.34*  CALCIUM 10.9* 10.6*   Liver Function Tests:  Recent Labs Lab 07/11/13 1300 07/12/13 0132  AST 17 15  ALT 11 11  ALKPHOS 77 75  BILITOT 0.9 0.8  PROT 6.0 5.7*  ALBUMIN 2.2* 2.1*   No results found for this basename: LIPASE, AMYLASE,  in the last 168 hours  Recent  Labs Lab 07/11/13 1300  AMMONIA 47   CBC:  Recent Labs Lab 07/11/13 1300 07/12/13 0132  WBC 13.6* 13.7*  NEUTROABS 11.8*  --   HGB 12.6* 12.0*  HCT 38.4* 36.9*  MCV 87.1 88.5  PLT 45* 58*   Cardiac Enzymes:  Recent Labs Lab 07/11/13 1300 07/12/13 0023 07/12/13 0132  TROPONINI 0.62* 0.54* 0.47*   CBG:  Recent Labs Lab 07/11/13 1321 07/11/13 2057 07/11/13 2359 07/12/13 0349 07/12/13 0835  GLUCAP 92 90 91 75 77   Iron Studies: No results found for this basename: IRON, TIBC, TRANSFERRIN, FERRITIN,  in the last 72 hours Studies/Results: Ct Head Wo Contrast  07/11/2013   CLINICAL DATA:  Decreased level of consciousness.  Weakness.  EXAM: CT HEAD WITHOUT CONTRAST  TECHNIQUE: Contiguous axial images were obtained from the base of the skull through the vertex without intravenous contrast.  COMPARISON:  CT head without contrast 03/26/2011  FINDINGS: Moderate generalized atrophy and white matter disease is similar to the prior study. A remote infarct of the left frontal lobe is again seen. Remote lacunar infarcts in the basal ganglia bilaterally are similar to the prior study.  No acute cortical infarct, hemorrhage, or mass lesion is present. The ventricles are proportionate to the degree of atrophy. No significant extra-axial fluid collection is present.  The paranasal sinuses are clear. There is some fluid in the dependent left mastoid air cells. The remaining paranasal sinuses are clear. Atherosclerotic calcifications are present within the cavernous carotid arteries bilaterally.  IMPRESSION: 1. Stable atrophy and diffuse white  matter disease, advanced for age. This likely reflects the sequela of chronic microvascular ischemia. 2. Remote left frontal lobe infarct. 3. No acute intracranial abnormality. 4. Atherosclerosis.   Electronically Signed   By: Gennette Pachris  Mattern M.D.   On: 07/11/2013 13:44   Dg Chest Port 1 View  07/11/2013   CLINICAL DATA:  Lethargy.  EXAM: PORTABLE CHEST - 1 VIEW  COMPARISON:  05/04/2013  FINDINGS: Left central line tips in the region of mid to distal superior vena cava without evidence of gross pneumothorax.  Vascular stent mid to distal humerus level.  Pulmonary vascular congestion/pulmonary edema.  Consolidation left base unchanged. This may represent crowding of vessels/atelectasis secondary to cardiomegaly however, pleural effusion, infiltrate or mass not excluded.  IMPRESSION: Pulmonary vascular congestion/pulmonary edema.  Consolidation left base unchanged. This may represent crowding of vessels/atelectasis secondary to cardiomegaly however, pleural effusion, infiltrate or mass not excluded.   Electronically Signed   By: Bridgett LarssonSteve  Olson M.D.   On: 07/11/2013 12:47    ROS: Unable to obtain- pt not interactive. Per RN, son reports pt started to go downhill after getting R thigh AVG placed 2/13  Physical Exam: Filed Vitals:   07/11/13 2001 07/12/13 0000 07/12/13 0350 07/12/13 0831  BP:  107/57 97/50 103/54  Pulse:  87 86 94  Temp:  98.3 F (36.8 C) 98.4 F (36.9 C) 98 F (36.7 C)  TempSrc:  Axillary Axillary Axillary  Resp:  26 25 31   Height: 5\' 6"  (1.676 m)     Weight: 71.9 kg (158 lb 8.2 oz)  71.9 kg (158 lb 8.2 oz)   SpO2:  97% 96% 96%     General: Well developed, well nourished, in no acute distress. Head: Normocephalic, atraumatic, sclera non-icteric, mucus membranes are moist Neck: Supple. JVD not elevated. Lungs:  Breathing is unlabored, shallow. Diminished throughout, rhonchi bilat bases Heart: Tachy with S1 S2.  Systolic murmur. No rubs, or gallops appreciated. Abdomen: Soft,  non-tender, non-distended with normoactive bowel sounds. No rebound/guarding. No obvious abdominal masses. M-S:   Appears somewhat contracted in fetal position. Does not move extremities  Lower extremities: Trace pedal edema Neuro: Awake, does not follow commands. Non interactive.  Dialysis Access:  R thigh AVG (placed 2/13)  L cath  Dialysis Orders:  TTS @[facility]  4:15   2K/2Ca   80kgs   L cath 2600u Heparin 400/800 No meds Profile 4  Assessment/Plan: 1. Shock / hypotension- suspected sepsis 2. Fluid overload- Chest xray- pulm edema. KVO fluid, received bolus on admission. HD today. Echo pending 3. ESRD -  TTS @ [facility]. HD pending today. K+ 3.6. Recent R thigh AVG placed, does not look grossly infected 4. Hypotension/volume  - 103/54 about 8kgs under EDW-needs to be lowered at DC. Slight volume overload. hypotension appears to be at outpt baseline. Will start home midodrine 5. Anemia  - hgb 12. No oupt epo or iron- watch CBC 6. Metabolic bone disease -  Ca+ 10.6. Out pt sensipar and velphoro on hold until diet started 7. Nutrition - Alb 2.- currently NPO, awaing swollow eval 8. DM- aspart. Per primary- CBg 77 9. DNR 10. Hx CVA / dementia / psychosis- lives at skilled nursing facility [facility]); spoke with staff about pt's baseline, pt is full care except he feeds himself sometimes; incontinent of bowel and bladder, two assist to chair or wheelchair, does not ambulate. Speaks from time to time, "when he wants". Oriented to place not to time.    Jetty Duhamel, NP [facility]/ [place] [phone] 07/12/2013, 11:20 AM   I have seen and examined patient, discussed with PA and agree with assessment and plan as outlined above.  Pt with shock, sacral decub, BP up with IVF"s. CXR shows pulm edema but BP's low and doubt we will get much volume off with HD.  DNR.  Attempt HD today, UF as tolerated. Patient deteriorating with repeated hospital admissions, FTT, and full care at [facility] for  some time.  Recommend palliative care evaluation, benefit of continued dialysis in question. Vinson Moselle MD pager [phone]    cell [phone] 07/12/2013, 3:06 PM

## 2013-07-12 NOTE — Progress Notes (Signed)
Clinical Social Work Department BRIEF PSYCHOSOCIAL ASSESSMENT 07/12/2013  Patient:  [name],[name]     Account Number:  [id number]     Admit date:  07/11/2013  Clinical Social Worker:  Varney BilesANDERSON,Harris Penton, LCSWA  Date/Time:  07/12/2013 02:19 PM  Referred by:  Physician  Date Referred:  07/12/2013 Referred for  SNF Placement   Other Referral:   Interview type:  Other - See comment Other interview type:   CSW called facility after leaving voicemail for pt's son Tiburcio BashReginald requesting a call back.    PSYCHOSOCIAL DATA Living Status:  FACILITY Admitted from facility:  [facility] [place] Level of care:  Skilled Nursing Facility Primary support name:  Jerome ForestReginald Steedley [phone]) Primary support relationship to patient:  CHILD, ADULT Degree of support available:   Good--pt's sons Tiburcio BashReginald and [name] involved in care; facility states POA lives out of state but that CSW should speak with Tiburcio BashReginald about pt's care and he relays information to POA.    CURRENT CONCERNS Current Concerns  Post-Acute Placement   Other Concerns:    SOCIAL WORK ASSESSMENT / PLAN CSW called pt's son [name] and was unable to leave a voicemail. CSW received message saying "the caller who are trying to reach is currently unavailable, try again at another time." CSW called work number listed and was unable to reach son. CSW called pt's son Tiburcio BashReginald and left a voicemail requesting a call back. [facility] [place] and facility states pt is a long-term resident and he has been there since 2011. CSW received voicemail from Dutch JohnReginald, and CSW called him back and explained CSW role in discharge. Tiburcio BashReginald confirmed they [facility] , and CSW explained she will update Tiburcio BashReginald when pt is ready for discharge. Tiburcio BashReginald thanked CSW for assistance. CSW asked if he has any questions or concerns CSW can help address, and Tiburcio BashReginald states that he does not at this time. CSW explained  CSW is calling on work cell, and if anything comes up Tiburcio BashReginald is welcome to call. Tiburcio BashReginald thanked CSW again and says he will do this.   Assessment/plan status:  Psychosocial Support/Ongoing Assessment of Needs Other assessment/ plan:   Information/referral to community resources:   [facility] [place]    PATIENT'S/FAMILY'S RESPONSE TO PLAN OF CARE: Good--pt's son friendly in phone conversation with CSW, expressed understanding of CSW role in discharge. Tiburcio BashReginald had no concerns or questions but CSW invited him to call if anything came up. CSW following case and will continue to provide support and assist with [facility].       [name], MSW, [facility] Center[facility]WA Clinical Social Worker [phone]

## 2013-07-12 NOTE — Progress Notes (Signed)
Utilization Review Completed.  

## 2013-07-13 DIAGNOSIS — I959 Hypotension, unspecified: Secondary | ICD-10-CM

## 2013-07-13 DIAGNOSIS — G92 Toxic encephalopathy: Secondary | ICD-10-CM

## 2013-07-13 DIAGNOSIS — I059 Rheumatic mitral valve disease, unspecified: Secondary | ICD-10-CM

## 2013-07-13 DIAGNOSIS — G929 Unspecified toxic encephalopathy: Secondary | ICD-10-CM

## 2013-07-13 LAB — COMPREHENSIVE METABOLIC PANEL
ALT: 12 U/L (ref 0–53)
AST: 18 U/L (ref 0–37)
Albumin: 2.1 g/dL — ABNORMAL LOW (ref 3.5–5.2)
Alkaline Phosphatase: 110 U/L (ref 39–117)
BILIRUBIN TOTAL: 1.1 mg/dL (ref 0.3–1.2)
BUN: 25 mg/dL — AB (ref 6–23)
CO2: 28 mEq/L (ref 19–32)
CREATININE: 3.41 mg/dL — AB (ref 0.50–1.35)
Calcium: 9.5 mg/dL (ref 8.4–10.5)
Chloride: 101 mEq/L (ref 96–112)
GFR calc non Af Amer: 16 mL/min — ABNORMAL LOW (ref >90)
GFR, EST AFRICAN AMERICAN: 18 mL/min — AB (ref >90)
GLUCOSE: 70 mg/dL (ref 70–99)
POTASSIUM: 3.3 meq/L — AB (ref 3.7–5.3)
Sodium: 145 mEq/L (ref 137–147)
Total Protein: 6 g/dL (ref 6.0–8.3)

## 2013-07-13 LAB — CBC
HEMATOCRIT: 38.3 % — AB (ref 39.0–52.0)
HEMOGLOBIN: 12.5 g/dL — AB (ref 13.0–17.0)
MCH: 28.8 pg (ref 26.0–34.0)
MCHC: 32.6 g/dL (ref 30.0–36.0)
MCV: 88.2 fL (ref 78.0–100.0)
Platelets: 46 10*3/uL — ABNORMAL LOW (ref 150–400)
RBC: 4.34 MIL/uL (ref 4.22–5.81)
RDW: 16.5 % — AB (ref 11.5–15.5)
WBC: 18.8 10*3/uL — ABNORMAL HIGH (ref 4.0–10.5)

## 2013-07-13 LAB — GLUCOSE, CAPILLARY
GLUCOSE-CAPILLARY: 81 mg/dL (ref 70–99)
Glucose-Capillary: 104 mg/dL — ABNORMAL HIGH (ref 70–99)
Glucose-Capillary: 120 mg/dL — ABNORMAL HIGH (ref 70–99)
Glucose-Capillary: 68 mg/dL — ABNORMAL LOW (ref 70–99)
Glucose-Capillary: 71 mg/dL (ref 70–99)
Glucose-Capillary: 86 mg/dL (ref 70–99)
Glucose-Capillary: 86 mg/dL (ref 70–99)
Glucose-Capillary: 99 mg/dL (ref 70–99)

## 2013-07-13 LAB — PROCALCITONIN: PROCALCITONIN: 73.01 ng/mL

## 2013-07-13 MED ORDER — CHLORHEXIDINE GLUCONATE 0.12 % MT SOLN
15.0000 mL | Freq: Two times a day (BID) | OROMUCOSAL | Status: DC
  Administered 2013-07-13 – 2013-07-14 (×4): 15 mL via OROMUCOSAL
  Filled 2013-07-13 (×7): qty 15

## 2013-07-13 MED ORDER — BIOTENE DRY MOUTH MT LIQD
15.0000 mL | Freq: Two times a day (BID) | OROMUCOSAL | Status: DC
  Administered 2013-07-13 – 2013-07-15 (×3): 15 mL via OROMUCOSAL

## 2013-07-13 MED ORDER — DEXTROSE 50 % IV SOLN
25.0000 mL | Freq: Once | INTRAVENOUS | Status: AC | PRN
  Administered 2013-07-13: 25 mL via INTRAVENOUS

## 2013-07-13 MED ORDER — VANCOMYCIN HCL IN DEXTROSE 750-5 MG/150ML-% IV SOLN
750.0000 mg | INTRAVENOUS | Status: DC
  Filled 2013-07-13: qty 150

## 2013-07-13 MED ORDER — DEXTROSE 50 % IV SOLN
INTRAVENOUS | Status: AC
  Filled 2013-07-13: qty 50

## 2013-07-13 NOTE — Progress Notes (Deleted)
CRITICAL VALUE ALERT  Critical value received Wrong data for wrong patient error  Date of notification: 07/13/13  Time of notification:  0900 in person  Critical value read back:  Nurse who received alert:  Lynwood Dawleyara RN  MD notified (1st page):  Dr. Butler Denmarkizwan  Time of first page:    MD notified (2nd page):  Time of second page:  Responding MD:    Time MD responded: Dr. Butler Denmarkizwan 0900

## 2013-07-13 NOTE — Progress Notes (Signed)
Pt transferred to room 6E 16 from 2600 via bed.  SCD's, boots, NPO, Palliative care. PT to eval and treat.

## 2013-07-13 NOTE — Progress Notes (Addendum)
[facility] PROGRESS NOTE  Jerome BeechJoseph N [name] WUJ:[id number] DOB: December 27, 1931 DOA: 07/11/2013 PCP: Bufford SpikesEED, TIFFANY, DO  Assessment/Plan: Principal Problem:   Severe Sepsis with hypotension - Gram pos cocci in blood cx x 2 sets and gr neg in 1 set - possibly from sacral wound vs dialysis cath (which may need to be changed now)  - BP improving- cont Midodrine  Active Problems:   Pulmonary edema:  - manage with dialysis    ESRD (end stage renal disease) on dialysis:  - nephrology following- recommending palliative care due to co-morbidities and poor response during dialyis    Vascular dementia with depression   Altered mental status/toxic metabolic encephalopathy - likely due to sepsis and improving - baseline is ambulatory and interactive - more alert today - CT head negative     Sacral decubitus ulcer, stage II - Appreciate wound care help. Dressing changes noted as well as air mattress overlay    Hypernatremia - Secondary to shock and concentration.     DM type 2, uncontrolled, with renal complications: - CBG range 77-90.  - will ask SLP to evaluate for readiness for oral feeds    Elevated troponin - Given renal dysfunction, minimal elevation hard to attribute to cardiac.  - Troponins are trending down today although unclear if because hypotension better  Psych issues - meds on hold due to poor alertness - resume Lexapro today - cont to hold Ativan, Remeron and Risperdal  Code Status: DO NOT RESUSCITATE  Family Communication: Dr Rae RoamKrisnan poke with the son, Tiburcio BashReginald, power of attorney by phone on 2/24- I have spoken with him in detail today- he is agreeable to keeping his father comfortable but there is another brother who also has POA and needs to be involved in the decision making- Birdena JubileeJoey Deery- [phone]- [phone]  Disposition Plan: tx out of step down   Consultants:  Wound care  Nephrology  Antibiotics:  IV vancomycin and Zosyn: 2/23-present    HPI/Subjective: Pt awake and communicating with weak voice- aware his is in the hospital. No complaints.   Objective: Filed Vitals:   07/13/13 0815  BP: 110/60  Pulse: 95  Temp: 98.4 F (36.9 C)  Resp: 27    Intake/Output Summary (Last 24 hours) at 07/13/13 1058 Last data filed at 07/13/13 0600  Gross per 24 hour  Intake    100 ml  Output    808 ml  Net   -708 ml   Filed Weights   07/12/13 1245 07/12/13 1740 07/13/13 0400  Weight: 72.1 kg (158 lb 15.2 oz) 71.5 kg (157 lb 10.1 oz) 71.5 kg (157 lb 10.1 oz)    Exam:   General:  Awake- decreased phonation- no acute distress  Cardiovascular: Regular rate and rhythm, S1-S2  Chest: Left-sided tunneled IJ catheter noted. No evidence of erythema or drainage.  Respiratory: Decreased breath sounds bibasilar, midway down  Abdomen: Soft, nontender, Nondistended, hypoactive bowel sounds  Musculoskeletal: No clubbing or cyanosis, trace edema. Graft noted, no evidence of infection.   Data Reviewed: Basic Metabolic Panel:  Recent Labs Lab 07/11/13 1300 07/12/13 0132 07/13/13 0244  NA 150* 149* 145  K 3.3* 3.6* 3.3*  CL 106 106 101  CO2 31 26 28   GLUCOSE 97 86 70  BUN 52* 60* 25*  CREATININE 5.81* 6.34* 3.41*  CALCIUM 10.9* 10.6* 9.5   Liver Function Tests:  Recent Labs Lab 07/11/13 1300 07/12/13 0132 07/13/13 0244  AST 17 15 18   ALT 11 11 12   ALKPHOS 77 75 110  BILITOT 0.9 0.8 1.1  PROT 6.0 5.7* 6.0  ALBUMIN 2.2* 2.1* 2.1*   No results found for this basename: LIPASE, AMYLASE,  in the last 168 hours  Recent Labs Lab 07/11/13 1300  AMMONIA 47   CBC:  Recent Labs Lab 07/11/13 1300 07/12/13 0132 07/13/13 0244  WBC 13.6* 13.7* 18.8*  NEUTROABS 11.8*  --   --   HGB 12.6* 12.0* 12.5*  HCT 38.4* 36.9* 38.3*  MCV 87.1 88.5 88.2  PLT 45* 58* 46*   Cardiac Enzymes:  Recent Labs Lab 07/11/13 1300 07/12/13 0023 07/12/13 0132  TROPONINI 0.62* 0.54* 0.47*   BNP (last 3 results)  Recent  Labs  07/11/13 1627  PROBNP >70000.0*   CBG:  Recent Labs Lab 07/12/13 1942 07/12/13 1950 07/12/13 2346 07/13/13 0401 07/13/13 0733  GLUCAP >600* 84 86 71 99    Recent Results (from the past 240 hour(s))  CULTURE, BLOOD (ROUTINE X 2)     Status: None   Collection Time    07/11/13  4:20 PM      Result Value Ref Range Status   Specimen Description BLOOD RIGHT ARM   Final   Special Requests     Final   Value: BOTTLES DRAWN AEROBIC AND ANAEROBIC 10CC BLUE,5CC RED   Culture  Setup Time     Final   Value: 07/12/2013 00:58     Performed at [facility]   Culture     Final   Value: GRAM POSITIVE COCCI IN CLUSTERS     Note: Gram Stain Report Called to,Read Back By and Verified With: DARA ANDERSON ON 07/13/2013 AT 7:19P BY WILEJ     Performed at [facility]   Report Status PENDING   Incomplete  CULTURE, BLOOD (ROUTINE X 2)     Status: None   Collection Time    07/11/13  4:30 PM      Result Value Ref Range Status   Specimen Description BLOOD RIGHT HAND   Final   Special Requests BOTTLES DRAWN AEROBIC ONLY 8CC   Final   Culture  Setup Time     Final   Value: 07/12/2013 00:57     Performed at [facility]   Culture     Final   Value: Romie Minus NEGATIVE RODS     GRAM POSITIVE COCCI IN CLUSTERS     Note: Gram Stain Report Called to,Read Back By and Verified With: DARA ANDERSON ON 07/12/2013 AT 7:19P BY WILEJ     Performed at [facility]   Report Status PENDING   Incomplete  MRSA PCR SCREENING     Status: Abnormal   Collection Time    07/11/13  7:37 PM      Result Value Ref Range Status   MRSA by PCR INVALID RESULTS, SPECIMEN SENT FOR CULTURE (*) NEGATIVE Final   Comment: RESULT CALLED TO, READ BACK BY AND VERIFIED WITH:     THOMAS,T RN [id number] 07/12/13 MITCHELL,L                The GeneXpert MRSA Assay (FDA     approved for NASAL specimens     only), is one component of a     comprehensive MRSA colonization     surveillance program. It is not      intended to diagnose MRSA     infection nor to guide or     monitor treatment for     MRSA infections.     Studies: Ct Head  Wo Contrast  07/11/2013   CLINICAL DATA:  Decreased level of consciousness.  Weakness.  EXAM: CT HEAD WITHOUT CONTRAST  TECHNIQUE: Contiguous axial images were obtained from the base of the skull through the vertex without intravenous contrast.  COMPARISON:  CT head without contrast 03/26/2011  FINDINGS: Moderate generalized atrophy and white matter disease is similar to the prior study. A remote infarct of the left frontal lobe is again seen. Remote lacunar infarcts in the basal ganglia bilaterally are similar to the prior study.  No acute cortical infarct, hemorrhage, or mass lesion is present. The ventricles are proportionate to the degree of atrophy. No significant extra-axial fluid collection is present.  The paranasal sinuses are clear. There is some fluid in the dependent left mastoid air cells. The remaining paranasal sinuses are clear. Atherosclerotic calcifications are present within the cavernous carotid arteries bilaterally.  IMPRESSION: 1. Stable atrophy and diffuse white matter disease, advanced for age. This likely reflects the sequela of chronic microvascular ischemia. 2. Remote left frontal lobe infarct. 3. No acute intracranial abnormality. 4. Atherosclerosis.   Electronically Signed   By: Gennette Pac M.D.   On: 07/11/2013 13:44   Dg Chest Port 1 View  07/11/2013   CLINICAL DATA:  Lethargy.  EXAM: PORTABLE CHEST - 1 VIEW  COMPARISON:  05/04/2013  FINDINGS: Left central line tips in the region of mid to distal superior vena cava without evidence of gross pneumothorax.  Vascular stent mid to distal humerus level.  Pulmonary vascular congestion/pulmonary edema.  Consolidation left base unchanged. This may represent crowding of vessels/atelectasis secondary to cardiomegaly however, pleural effusion, infiltrate or mass not excluded.  IMPRESSION: Pulmonary vascular  congestion/pulmonary edema.  Consolidation left base unchanged. This may represent crowding of vessels/atelectasis secondary to cardiomegaly however, pleural effusion, infiltrate or mass not excluded.   Electronically Signed   By: Bridgett Larsson M.D.   On: 07/11/2013 12:47    Scheduled Meds: . antiseptic oral rinse  15 mL Mouth Rinse q12n4p  . ceFEPime (MAXIPIME) IV  2 g Intravenous Q T,Th,Sat-1800  . chlorhexidine  15 mL Mouth Rinse BID  . enoxaparin (LOVENOX) injection  30 mg Subcutaneous Q24H  . insulin aspart  0-9 Units Subcutaneous 6 times per day  . midodrine  10 mg Oral 3 times per day  . [START ON 07/14/2013] vancomycin  750 mg Intravenous Q T,Th,Sa-HD      Time spent: 35 minutes    [facility]  [facility] Pager [phone]. If 7PM-7AM, please contact night-coverage at [email or web address], password [facility] 07/13/2013, 10:58 AM  LOS: 2 days

## 2013-07-13 NOTE — Progress Notes (Addendum)
INITIAL NUTRITION ASSESSMENT  DOCUMENTATION CODES Per approved criteria  -Not Applicable   INTERVENTION:  If TF desired, recommend initiation of Nepro formula at 10 ml/hr and increase by 15 ml every 8 hours to goal rate of 35 ml/hr with Prostat liquid protein 30 ml BID to provide 1712 kcals, 98 gm protein, 611 ml of free water RD to follow for nutrition care plan  NUTRITION DIAGNOSIS: Inadequate oral intake related to inability to eat as evidenced by NPO status  Goal: Pt to meet >/= 90% of their estimated nutrition needs   Monitor:  PO diet advancement vs TF initiation, goals of care, weight, labs, I/O's  Reason for Assessment: Low Braden  80 y.o. male  Admitting Dx: Severe sepsis(995.92)  ASSESSMENT: Past medical history of CAD, diabetes mellitus and end-stage renal disease who just had a fistula placed in the right thigh and dialysis catheter placed a few days ago who normally lives in a nursing facility and is able to ambulate, interactive and has a generally good quality life who for the last few days has been having decreased by mouth intake and less communication and then today was minimally responsive. He did indeed receive dialysis on Saturday. The patient was brought into the emergency room for further evaluation.   In the emergency room, patient was noted by blood work a sodium of 150, renal function consistent with end-stage renal disease, mild elevated troponin at 0.62 and a white count of 13.6. Chest x-ray no pulmonary edema but no signs of pneumonia. Patient is unable to make urine. He was noted to have a moderate sacral decubitus ulcer which had some whitish drainage. Patient was also noted to be hypotensive with a systolic blood pressure in the 80s. He was given IV fluids and blood .cultures were drawn. Hospitals were called for further evaluation.  RD unable to obtain nutrition hx from patient; s/p bedside swallow evaluation this AM -- patient not appropriate for POs  until mentation improves; per weight readings, patient has had severe weight loss of 17% x 1 month; + deep tissue injury to sacrum; low braden places pt at risk for further skin breakdown.  Palliative Care Team consulted for goals of care.  RD unable to complete Nutrition Focused Physical Exam at this time.  RD suspects some level of malnutrition, however, unable to identify at this time.  Height: Ht Readings from Last 1 Encounters:  07/11/13 5\' 6"  (1.676 m)    Weight: Wt Readings from Last 1 Encounters:  07/13/13 157 lb 10.1 oz (71.5 kg)    Ideal Body Weight: 130 lb  % Ideal Body Weight: 120%  Wt Readings from Last 10 Encounters:  07/13/13 157 lb 10.1 oz (71.5 kg)  07/02/13 173 lb 8 oz (78.7 kg)  07/02/13 173 lb 8 oz (78.7 kg)  06/24/13 182 lb (82.555 kg)  06/15/13 190 lb (86.183 kg)  05/26/13 190 lb (86.183 kg)  05/06/13 190 lb (86.183 kg)  05/05/13 182 lb 5.1 oz (82.7 kg)  05/05/13 182 lb 5.1 oz (82.7 kg)  04/29/13 190 lb (86.183 kg)    Usual Body Weight: 190 lb -- January 2015  % Usual Body Weight: 82%  BMI:  Body mass index is 25.45 kg/(m^2).  Estimated Nutritional Needs: Kcal: 1600-1800 Protein: 90-100 gm Fluid: 1.6-1.8 L  Skin: deep tissue injury to sacrum  Diet Order: NPO  EDUCATION NEEDS: -No education needs identified at this time   Intake/Output Summary (Last 24 hours) at 07/13/13 1504 Last data filed at 07/13/13  1400  Gross per 24 hour  Intake    260 ml  Output    808 ml  Net   -548 ml    Labs:   Recent Labs Lab 07/11/13 1300 07/12/13 0132 07/13/13 0244  NA 150* 149* 145  K 3.3* 3.6* 3.3*  CL 106 106 101  CO2 31 26 28   BUN 52* 60* 25*  CREATININE 5.81* 6.34* 3.41*  CALCIUM 10.9* 10.6* 9.5  GLUCOSE 97 86 70    CBG (last 3)   Recent Labs  07/12/13 2346 07/13/13 0401 07/13/13 0733  GLUCAP 86 71 99    Scheduled Meds: . antiseptic oral rinse  15 mL Mouth Rinse q12n4p  . ceFEPime (MAXIPIME) IV  2 g Intravenous Q  T,Th,Sat-1800  . chlorhexidine  15 mL Mouth Rinse BID  . enoxaparin (LOVENOX) injection  30 mg Subcutaneous Q24H  . insulin aspart  0-9 Units Subcutaneous 6 times per day  . midodrine  10 mg Oral 3 times per day  . [START ON 07/14/2013] vancomycin  750 mg Intravenous Q T,Th,Sa-HD    Continuous Infusions: . dextrose 75 mL (07/12/13 1148)    Past Medical History  Diagnosis Date  . Diabetes mellitus   . Hypertension   . Arthritis   . Cataract   . CAD (coronary artery disease)   . Secondary hyperthyroidism   . Dementia   . History of thrombocytopenia   . GERD (gastroesophageal reflux disease)   . Anxiety     paranoia  . Stroke   . Chronic kidney disease     Tue, Thurs, Sat dialysis, goes to [address]  . Anemia   . Psychosis   . Hypotension   . Cataract   . Delusional disorder   . Hyperlipidemia, mixed   . ESRD (end stage renal disease)     Past Surgical History  Procedure Laterality Date  . Arteriovenous graft placement  05/01/10    Left upper arm brachial artery to axillary vein AVG  . Thrombectomy / arteriovenous graft revision  11/07/10    Left upper arm AVG  by Dr. Imogene Burn  . Diatek catheter insertion  12/05/10    Left side  . Bone spur      foot surgery  . Av fistula placement  04/14/2011    Procedure: ARTERIOVENOUS (AV) FISTULA CREATION;  Surgeon: Nilda Simmer, MD;  Location: [facility] OR;  Service: Vascular;  Laterality: Right;   Second  stage basilic vein transposition  . Knee surgery Left   . Foot surgery Left   . Bunionectomy    . Ligation of arteriovenous  fistula Right 04/26/2013    Procedure: LIGATION OF ARTERIOVENOUS  FISTULA;  Surgeon: Sherren Kerns, MD;  Location: [facility] OR;  Service: Vascular;  Laterality: Right;  . Insertion of dialysis catheter Left 04/26/2013    Procedure: INSERTION OF DIALYSIS CATHETER;  Surgeon: Sherren Kerns, MD;  Location: [facility] OR;  Service: Vascular;  Laterality: Left;  . Exchange of a dialysis catheter Left 05/03/2013     Procedure: EXCHANGE OF A DIALYSIS CATHETER;  Surgeon: Sherren Kerns, MD;  Location: [facility] OR;  Service: Vascular;  Laterality: Left;  . Av fistula placement Right 07/01/2013    Procedure: INSERTION OF ARTERIOVENOUS (AV) GORE-TEX GRAFT THIGH;  Surgeon: Chuck Hint, MD;  Location: [facility] OR;  Service: Vascular;  Laterality: Right;    Maureen Chatters, RD, LDN Pager #: [phone] After-Hours Pager #: [phone]

## 2013-07-13 NOTE — Evaluation (Signed)
Clinical/Bedside Swallow Evaluation Patient Details  Name: [name] MRN: [id number] Date of Birth: 1931/05/27  Today's Date: 07/13/2013 Time: 4098-1191 SLP Time Calculation (min): 16 min  Past Medical History:  Past Medical History  Diagnosis Date  . Diabetes mellitus   . Hypertension   . Arthritis   . Cataract   . CAD (coronary artery disease)   . Secondary hyperthyroidism   . Dementia   . History of thrombocytopenia   . GERD (gastroesophageal reflux disease)   . Anxiety     paranoia  . Stroke   . Chronic kidney disease     Tue, Thurs, Sat dialysis, goes to [address]  . Anemia   . Psychosis   . Hypotension   . Cataract   . Delusional disorder   . Hyperlipidemia, mixed   . ESRD (end stage renal disease)    Past Surgical History:  Past Surgical History  Procedure Laterality Date  . Arteriovenous graft placement  05/01/10    Left upper arm brachial artery to axillary vein AVG  . Thrombectomy / arteriovenous graft revision  11/07/10    Left upper arm AVG  by Dr. Imogene Burn  . Diatek catheter insertion  12/05/10    Left side  . Bone spur      foot surgery  . Av fistula placement  04/14/2011    Procedure: ARTERIOVENOUS (AV) FISTULA CREATION;  Surgeon: Nilda Simmer, MD;  Location: [facility] OR;  Service: Vascular;  Laterality: Right;   Second  stage basilic vein transposition  . Knee surgery Left   . Foot surgery Left   . Bunionectomy    . Ligation of arteriovenous  fistula Right 04/26/2013    Procedure: LIGATION OF ARTERIOVENOUS  FISTULA;  Surgeon: Sherren Kerns, MD;  Location: [facility] OR;  Service: Vascular;  Laterality: Right;  . Insertion of dialysis catheter Left 04/26/2013    Procedure: INSERTION OF DIALYSIS CATHETER;  Surgeon: Sherren Kerns, MD;  Location: [facility] OR;  Service: Vascular;  Laterality: Left;  . Exchange of a dialysis catheter Left 05/03/2013    Procedure: EXCHANGE OF A DIALYSIS CATHETER;  Surgeon: Sherren Kerns, MD;  Location: [facility] OR;  Service:  Vascular;  Laterality: Left;  . Av fistula placement Right 07/01/2013    Procedure: INSERTION OF ARTERIOVENOUS (AV) GORE-TEX GRAFT THIGH;  Surgeon: Chuck Hint, MD;  Location: [facility] OR;  Service: Vascular;  Laterality: Right;   HPI:  [name] is a 80 y.o. male with a past medical history of CAD, diabetes mellitus and end-stage renal disease who just had a fistula placed in the right thigh and dialysis catheter placed a few days ago who normally lives in a nursing facility and is able to ambulate, interactive and has a generally good quality life who for the last few days has been having decreased by mouth intake and less communication and then was minimally responsive. Pt with pulmonary edema. concern for sacral wound as cause for septic shock.    Assessment / Plan / Recommendation Clinical Impression  Pt is awake with repositioning and oral care, but is not aware of PO trials, no oral manipulation or transit of ice chip. Pt is not appropriate for POs until mentation improves. Will continue efforts.     Aspiration Risk  Severe    Diet Recommendation NPO   Medication Administration: Via alternative means    Other  Recommendations Oral Care Recommendations: Oral care Q4 per protocol   Follow Up Recommendations  Skilled Nursing  facility    Frequency and Duration min 2x/week  2 weeks   Pertinent Vitals/Pain NA    SLP Swallow Goals     Swallow Study Prior Functional Status       General HPI: [name] is a 80 y.o. male with a past medical history of CAD, diabetes mellitus and end-stage renal disease who just had a fistula placed in the right thigh and dialysis catheter placed a few days ago who normally lives in a nursing facility and is able to ambulate, interactive and has a generally good quality life who for the last few days has been having decreased by mouth intake and less communication and then was minimally responsive. Pt with pulmonary edema. concern for  sacral wound as cause for septic shock.  Type of Study: Bedside swallow evaluation Diet Prior to this Study: NPO Temperature Spikes Noted: No Respiratory Status: Room air History of Recent Intubation: No Behavior/Cognition: Alert;Uncooperative;Confused Oral Cavity - Dentition: Edentulous Self-Feeding Abilities: Total assist Patient Positioning: Postural control interferes with function Baseline Vocal Quality:  (no phonation) Volitional Cough: Cognitively unable to elicit Volitional Swallow: Unable to elicit    Oral/Motor/Sensory Function Overall Oral Motor/Sensory Function: Appears within functional limits for tasks assessed (does not follow commands, strong resistance to oral care)   Ice Chips Ice chips: Impaired Presentation: Spoon Oral Phase Impairments: Poor awareness of bolus   Thin Liquid Thin Liquid: Not tested    Nectar Thick Nectar Thick Liquid: Not tested   Honey Thick Honey Thick Liquid: Not tested   Puree Puree: Not tested   Solid   GO    Solid: Not tested      Jerome DittyBonnie Delesia Martinek, MA CCC-SLP [phone]  [name], Riley NearingBonnie [name] 07/13/2013,8:37 AM

## 2013-07-13 NOTE — Progress Notes (Signed)
  [facility] Progress Note   Subjective: Lying on side, tries to answer questions, confused. Minimal UF w HD yest due to low BP's  Filed Vitals:   07/12/13 2346 07/13/13 0040 07/13/13 0400 07/13/13 0815  BP: 109/58  101/86 110/60  Pulse: 87  85 95  Temp: 98.7 F (37.1 C)  98.3 F (36.8 C) 98.4 F (36.9 C)  TempSrc: Axillary  Axillary Oral  Resp: 28  21 27   Height:      Weight:   71.5 kg (157 lb 10.1 oz)   SpO2: 97% 97% 98% 93%   Exam Elderly AAM, lying on side, difficult to understand, disoriented No jvd Chest clear bilat RRR no MRG ABd soft, nt, nd Ext no LE edema Neuro gen weakness, LE contractures, fully disoriented  Dialysis: [facility] 4h 15min  2K/2.0 Bath  80kg  L IJ cath  Heparin 2600   No meds  Assessment: 1 Shock / gram +sepsis- on IV abx 2 ESRD 3 Abnormal cxr- suggesting pulm edema but unable to pull fluid due to shock and no vol excess on exam 4 Volume- 8kg below dry wt, as above 5 Anemia- Hb up , no esa 6 DM 7 DNR 8 Hx CVA / dementia- full care at SNF   Plan- cont IV abx; have d/w primary, pt declining and very poor physical condition and QOL, further dialysis will not change QOL, recommend Pall care    Vinson Moselleob Jeremias Broyhill MD  pager [phone]    cell [phone]  07/13/2013, 11:08 AM     Recent Labs Lab 07/11/13 1300 07/12/13 0132 07/13/13 0244  NA 150* 149* 145  K 3.3* 3.6* 3.3*  CL 106 106 101  CO2 31 26 28   GLUCOSE 97 86 70  BUN 52* 60* 25*  CREATININE 5.81* 6.34* 3.41*  CALCIUM 10.9* 10.6* 9.5    Recent Labs Lab 07/11/13 1300 07/12/13 0132 07/13/13 0244  AST 17 15 18   ALT 11 11 12   ALKPHOS 77 75 110  BILITOT 0.9 0.8 1.1  PROT 6.0 5.7* 6.0  ALBUMIN 2.2* 2.1* 2.1*    Recent Labs Lab 07/11/13 1300 07/12/13 0132 07/13/13 0244  WBC 13.6* 13.7* 18.8*  NEUTROABS 11.8*  --   --   HGB 12.6* 12.0* 12.5*  HCT 38.4* 36.9* 38.3*  MCV 87.1 88.5 88.2  PLT 45* 58* 46*   . antiseptic oral rinse  15 mL Mouth Rinse q12n4p   . ceFEPime (MAXIPIME) IV  2 g Intravenous Q T,Th,Sat-1800  . chlorhexidine  15 mL Mouth Rinse BID  . enoxaparin (LOVENOX) injection  30 mg Subcutaneous Q24H  . insulin aspart  0-9 Units Subcutaneous 6 times per day  . midodrine  10 mg Oral 3 times per day  . [START ON 07/14/2013] vancomycin  750 mg Intravenous Q T,Th,Sa-HD   . dextrose 75 mL (07/12/13 1148)   acetaminophen, acetaminophen, morphine injection, ondansetron (ZOFRAN) IV, ondansetron

## 2013-07-13 NOTE — Progress Notes (Signed)
  Echocardiogram 2D Echocardiogram has been performed.  Aristide Waggle 07/13/2013, 9:20 AM

## 2013-07-14 DIAGNOSIS — E1129 Type 2 diabetes mellitus with other diabetic kidney complication: Secondary | ICD-10-CM

## 2013-07-14 DIAGNOSIS — R627 Adult failure to thrive: Secondary | ICD-10-CM

## 2013-07-14 DIAGNOSIS — Z515 Encounter for palliative care: Secondary | ICD-10-CM

## 2013-07-14 DIAGNOSIS — Z66 Do not resuscitate: Secondary | ICD-10-CM

## 2013-07-14 DIAGNOSIS — E1165 Type 2 diabetes mellitus with hyperglycemia: Secondary | ICD-10-CM

## 2013-07-14 LAB — CBC
HCT: 37.8 % — ABNORMAL LOW (ref 39.0–52.0)
Hemoglobin: 12.3 g/dL — ABNORMAL LOW (ref 13.0–17.0)
MCH: 28.5 pg (ref 26.0–34.0)
MCHC: 32.5 g/dL (ref 30.0–36.0)
MCV: 87.7 fL (ref 78.0–100.0)
PLATELETS: 57 10*3/uL — AB (ref 150–400)
RBC: 4.31 MIL/uL (ref 4.22–5.81)
RDW: 16.5 % — AB (ref 11.5–15.5)
WBC: 15 10*3/uL — AB (ref 4.0–10.5)

## 2013-07-14 LAB — BASIC METABOLIC PANEL
BUN: 42 mg/dL — ABNORMAL HIGH (ref 6–23)
CALCIUM: 10.4 mg/dL (ref 8.4–10.5)
CO2: 26 mEq/L (ref 19–32)
CREATININE: 5.02 mg/dL — AB (ref 0.50–1.35)
Chloride: 103 mEq/L (ref 96–112)
GFR calc Af Amer: 11 mL/min — ABNORMAL LOW (ref >90)
GFR calc non Af Amer: 10 mL/min — ABNORMAL LOW (ref >90)
GLUCOSE: 76 mg/dL (ref 70–99)
Potassium: 3.6 mEq/L — ABNORMAL LOW (ref 3.7–5.3)
Sodium: 146 mEq/L (ref 137–147)

## 2013-07-14 LAB — GLUCOSE, CAPILLARY
GLUCOSE-CAPILLARY: 85 mg/dL (ref 70–99)
GLUCOSE-CAPILLARY: 93 mg/dL (ref 70–99)
Glucose-Capillary: 74 mg/dL (ref 70–99)
Glucose-Capillary: 87 mg/dL (ref 70–99)

## 2013-07-14 MED ORDER — DEXTROSE 10 % IV SOLN
INTRAVENOUS | Status: DC
  Administered 2013-07-14: 13:00:00 via INTRAVENOUS

## 2013-07-14 MED ORDER — BISACODYL 10 MG RE SUPP: 10.0000 mg | Freq: Every day | RECTAL | Status: DC | PRN

## 2013-07-14 MED ORDER — MORPHINE SULFATE 2 MG/ML IJ SOLN
1.0000 mg | INTRAMUSCULAR | Status: DC | PRN
  Administered 2013-07-14 – 2013-07-15 (×5): 1 mg via INTRAVENOUS
  Filled 2013-07-14 (×5): qty 1

## 2013-07-14 MED ORDER — ATROPINE SULFATE 1 % OP SOLN
4.0000 [drp] | OPHTHALMIC | Status: DC | PRN
  Filled 2013-07-14: qty 2

## 2013-07-14 MED ORDER — LORAZEPAM 2 MG/ML IJ SOLN: 0.5000 mg | INTRAMUSCULAR | Status: DC | PRN

## 2013-07-14 MED ORDER — ACETAMINOPHEN 650 MG RE SUPP: 650.0000 mg | Freq: Four times a day (QID) | RECTAL | Status: DC | PRN

## 2013-07-14 NOTE — Clinical Social Work Note (Signed)
Palliative GOC meeting held today with family and Hospice facility placement requested. CSW contacted son Harvest ForestReginald Gilmore by phone to offer choice. [facility] preferences [facility] ([facility] and ([facility]. [facility] to make [facility] and clinicals will be transmitted to facility.  Genelle BalVanessa Kerisha Goughnour, MSW, LCSW [phone]

## 2013-07-14 NOTE — Consult Note (Signed)
Patient [name]      DOB: 09-18-1931      MLY:[id number]     Consult Note from the Palliative Medicine Team at [place] Requested by: Dr. Wynelle Cleveland     PCP: Hollace Kinnier, DO Reason for Consultation: [place] and options.    Phone [phone]  Assessment of patients Current state: [name] is a 80 yo male admitted with altered mental status, sepsis and has required continued hemodialysis for ESRD (previously receiving dialysis outpt). He had a recent fistula placement in right thigh and dialysis catheter a few days prior to this admission. His has had ongoing issues with hypotension that have been limiting for his dialysis tolerance. He is also minimally responsive, not able to interact or follow any commands. I have spoken via telephone with [name] two sons who are Eula Listen [phone]), and Joey [phone]). They are both understanding of their father's poor prognosis and poor progression and want to focus on his comfort. We discussed his poor quality of life and the benefits of continuing aggressive treatments (such as dialysis - IF he is able to tolerate). They wish at this time to discontinue dialysis, discontinue lab draws, no feeding tubes, and to focus on his comfort realizing that his time is very limited - likely days to weeks (d/t his renal failure diagnosis). Both sons are very realistic and want what is best for their father at this time. They would also like to look into placement in a hospice facility where the goals for comfort will be best met. I have given them my contact information for any further questions/concerns. I will continue to follow [name] and provide holistic support to him and his family.    Goals of Care: 1.  Code Status: DNR   2. Scope of Treatment: 1. Vital Signs: routine 2. Respiratory/Oxygen: for comfort 3. Nutritional Support/Tube Feeds: no 4. Antibiotics: continue until hospice placement 5. Blood Products:  no 6. Review of Medications to be discontinued: Minimized for comfort. 7. Labs: no 8. Telemetry: no 9. Consults: CSW for hospice placement.    4. Disposition: Hopeful for hospice facility.   3. Symptom Management:   1. Anxiety/Agitation: Lorazepam prn. 2. Pain: Morphine prn.  3. Bowel Regimen: Dulcolax supp prn.  4. Fever: Acetaminophen prn.  5. Nausea/Vomiting: Ondansetron prn.  6. Terminal Secretions: Atropine SL prn.   4. Psychosocial: Emotional support provided to [name] and his sons via telephone.    Brief HPI: 80 yo male with renal failure and sepsis. Not progressing and unable to take po. Family shifting to comfort care.    ROS: Unable to elicit - AMS.     PMH:  Past Medical History  Diagnosis Date  . Diabetes mellitus   . Hypertension   . Arthritis   . Cataract   . CAD (coronary artery disease)   . Secondary hyperthyroidism   . Dementia   . History of thrombocytopenia   . GERD (gastroesophageal reflux disease)   . Anxiety     paranoia  . Stroke   . Chronic kidney disease     Tue, Thurs, Sat dialysis, goes to [address]  . Anemia   . Psychosis   . Hypotension   . Cataract   . Delusional disorder   . Hyperlipidemia, mixed   . ESRD (end stage renal disease)      PSH: Past Surgical History  Procedure Laterality Date  . Arteriovenous graft placement  05/01/10  Left upper arm brachial artery to axillary vein AVG  . Thrombectomy / arteriovenous graft revision  11/07/10    Left upper arm AVG  by Dr. Bridgett Larsson  . Diatek catheter insertion  12/05/10    Left side  . Bone spur      foot surgery  . Av fistula placement  04/14/2011    Procedure: ARTERIOVENOUS (AV) FISTULA CREATION;  Surgeon: Hinda Lenis, MD;  Location: [place];  Service: Vascular;  Laterality: Right;   Second  stage basilic vein transposition  . Knee surgery Left   . Foot surgery Left   . Bunionectomy    . Ligation of arteriovenous  fistula Right 04/26/2013    Procedure:  LIGATION OF ARTERIOVENOUS  FISTULA;  Surgeon: Elam Dutch, MD;  Location: [place];  Service: Vascular;  Laterality: Right;  . Insertion of dialysis catheter Left 04/26/2013    Procedure: INSERTION OF DIALYSIS CATHETER;  Surgeon: Elam Dutch, MD;  Location: [place];  Service: Vascular;  Laterality: Left;  . Exchange of a dialysis catheter Left 05/03/2013    Procedure: EXCHANGE OF A DIALYSIS CATHETER;  Surgeon: Elam Dutch, MD;  Location: [place];  Service: Vascular;  Laterality: Left;  . Av fistula placement Right 07/01/2013    Procedure: INSERTION OF ARTERIOVENOUS (AV) GORE-TEX GRAFT THIGH;  Surgeon: Angelia Mould, MD;  Location: [facility];  Service: Vascular;  Laterality: Right;   I have reviewed the [place] and SH and  If appropriate update it with new information. No Known Allergies Scheduled Meds: . antiseptic oral rinse  15 mL Mouth Rinse q12n4p  . ceFEPime (MAXIPIME) IV  2 g Intravenous Q T,Th,Sat-1800  . chlorhexidine  15 mL Mouth Rinse BID  . enoxaparin (LOVENOX) injection  30 mg Subcutaneous Q24H  . insulin aspart  0-9 Units Subcutaneous 6 times per day  . midodrine  10 mg Oral 3 times per day  . vancomycin  750 mg Intravenous Q T,Th,Sa-HD   Continuous Infusions: . dextrose     PRN Meds:.acetaminophen, acetaminophen, morphine injection, ondansetron (ZOFRAN) IV    BP 99/57  Pulse 92  Temp(Src) 97.8 F (36.6 C) (Oral)  Resp 20  Ht $R'5\' 6"'Ki$  (1.676 m)  Wt 71.6 kg (157 lb 13.6 oz)  BMI 25.49 kg/m2  SpO2 98%   PPS: 10%   Intake/Output Summary (Last 24 hours) at 07/14/13 1139 Last data filed at 07/13/13 1500  Gross per 24 hour  Intake     80 ml  Output      0 ml  Net     80 ml   LBM: unknown - Dulcolax ordered PRN                        Physical Exam:  General: NAD, ill appearing HEENT:  [place]/AT, moist mucous membranes, no JVD, no icterus Chest: Diminished throughout, shallow breathes with limited air movement, no labored breathing CVS: RRR, S1 S2 Abdomen:  Soft, NT, ND, +BS hypoactive Ext: No edema, warm to touch, does not move extreme ties, 1+ DP pulses Neuro: Somnolent, unable to follow commands  Labs: CBC    Component Value Date/Time   WBC 15.0* 07/14/2013 0330   RBC 4.31 07/14/2013 0330   HGB 12.3* 07/14/2013 0330   HCT 37.8* 07/14/2013 0330   PLT 57* 07/14/2013 0330   MCV 87.7 07/14/2013 0330   MCH 28.5 07/14/2013 0330   MCHC 32.5 07/14/2013 0330   RDW 16.5* 07/14/2013 0330  LYMPHSABS 0.7 07/11/2013 1300   MONOABS 1.1* 07/11/2013 1300   EOSABS 0.0 07/11/2013 1300   BASOSABS 0.0 07/11/2013 1300    BMET    Component Value Date/Time   NA 146 07/14/2013 0330   K 3.6* 07/14/2013 0330   CL 103 07/14/2013 0330   CO2 26 07/14/2013 0330   GLUCOSE 76 07/14/2013 0330   BUN 42* 07/14/2013 0330   CREATININE 5.02* 07/14/2013 0330   CALCIUM 10.4 07/14/2013 0330   GFRNONAA 10* 07/14/2013 0330   GFRAA 11* 07/14/2013 0330    CMP     Component Value Date/Time   NA 146 07/14/2013 0330   K 3.6* 07/14/2013 0330   CL 103 07/14/2013 0330   CO2 26 07/14/2013 0330   GLUCOSE 76 07/14/2013 0330   BUN 42* 07/14/2013 0330   CREATININE 5.02* 07/14/2013 0330   CALCIUM 10.4 07/14/2013 0330   PROT 6.0 07/13/2013 0244   ALBUMIN 2.1* 07/13/2013 0244   AST 18 07/13/2013 0244   ALT 12 07/13/2013 0244   ALKPHOS 110 07/13/2013 0244   BILITOT 1.1 07/13/2013 0244   GFRNONAA 10* 07/14/2013 0330   GFRAA 11* 07/14/2013 0330     Time In Time Out Total Time Spent with Patient Total Overall Time  1100 1220 9min 26min    Greater than 50%  of this time was spent counseling and coordinating care related to the above assessment and plan.  Vinie Sill, NP Palliative Medicine Team Pager # [phone] (M-F 8a-5p) Team Phone # [phone] (Nights/Weekends)

## 2013-07-14 NOTE — Progress Notes (Signed)
Hemodialysis-Pt brought in for HD. BP 80s/40s. Had just received Midodrine on floor prior to arrival. Notified NP. Order to wait x25 minutes and re evaluate. Wants sbp>90. BP after 30 minutes 79/42. Dr. Arlean HoppingSchertz notified as well. Order to return pt to room. Unable to do HD at this time d/t low bp.

## 2013-07-14 NOTE — Progress Notes (Signed)
Subjective:   Eyes open but still not answering questions. Confused.  Objective Filed Vitals:   07/13/13 1611 07/13/13 1722 07/13/13 2025 07/14/13 0405  BP: 96/43 90/50 88/51  99/57  Pulse: 85 87 88 92  Temp:  98.3 F (36.8 C) 98.5 F (36.9 C) 97.8 F (36.6 C)  TempSrc:  Oral Oral Oral  Resp: 31 20 22 20   Height:   5\' 6"  (1.676 m)   Weight:   71.6 kg (157 lb 13.6 oz)   SpO2: 97% 95% 95% 98%   Physical Exam General: confused. Does not follow commands.  Heart: RRR no murmur Lungs:CTA, unlabored Abdomen: soft nontender +BS Extremities: trace pedal edema Dialysis Access: LIJ cath R thigh AVG- placed 2/13 +bruit  Dialysis: TTT [facility]  4h 2K/2.0 Bath 80kg L IJ cath Heparin 2600  No meds  Assessment/Plan: 1. Sepsis- antibiocs per primary. WBC 15. afebrile 2. ESRD - TTS @ [facility]. HD pending today.  3. Anemia - hgb 12.3- no esa Watch CBC 4. Secondary hyperparathyroidism - Ca+ 10.4. sensipar and binder on hold.  5. HTN/volume -  99/57. 8kgs below EDW, need to lower at DC 6. Nutrition - NPO 7. EOL- palliative care has been consulted Family has appropriately decided for no further dialysis, this was communicated to the palliative care team this morning.  Hospice transition will be undertaken. Have d/w palliative care. Appreciate care of [facility] and Palliative team. Will sign off.    Jetty Duhamel, NP [facility] Beeper [phone] 07/14/2013,8:29 AM  LOS: 3 days   I have seen and examined patient, discussed with PA and agree with assessment and plan as outlined above with additions as indicated. Vinson Moselle MD pager [phone]    cell [phone] 07/14/2013, 12:18 PM      Additional Objective Labs: Basic Metabolic Panel:  Recent Labs Lab 07/12/13 0132 07/13/13 0244 07/14/13 0330  NA 149* 145 146  K 3.6* 3.3* 3.6*  CL 106 101 103  CO2 26 28 26   GLUCOSE 86 70 76  BUN 60* 25* 42*  CREATININE 6.34* 3.41* 5.02*  CALCIUM 10.6* 9.5 10.4   Liver Function  Tests:  Recent Labs Lab 07/11/13 1300 07/12/13 0132 07/13/13 0244  AST 17 15 18   ALT 11 11 12   ALKPHOS 77 75 110  BILITOT 0.9 0.8 1.1  PROT 6.0 5.7* 6.0  ALBUMIN 2.2* 2.1* 2.1*   No results found for this basename: LIPASE, AMYLASE,  in the last 168 hours CBC:  Recent Labs Lab 07/11/13 1300 07/12/13 0132 07/13/13 0244 07/14/13 0330  WBC 13.6* 13.7* 18.8* 15.0*  NEUTROABS 11.8*  --   --   --   HGB 12.6* 12.0* 12.5* 12.3*  HCT 38.4* 36.9* 38.3* 37.8*  MCV 87.1 88.5 88.2 87.7  PLT 45* 58* 46* 57*   Blood Culture    Component Value Date/Time   SDES BLOOD RIGHT HAND 07/11/2013 1630   SPECREQUEST BOTTLES DRAWN AEROBIC ONLY 8CC 07/11/2013 1630   CULT  Value: GRAM NEGATIVE RODS GRAM POSITIVE COCCI IN CLUSTERS Note: Gram Stain Report Called to,Read Back By and Verified With: DARA ANDERSON ON 07/12/2013 AT 7:19P BY WILEJ Performed at [facility] 07/11/2013 1630   REPTSTATUS PENDING 07/11/2013 1630    Cardiac Enzymes:  Recent Labs Lab 07/11/13 1300 07/12/13 0023 07/12/13 0132  TROPONINI 0.62* 0.54* 0.47*   CBG:  Recent Labs Lab 07/13/13 2134 07/14/13 07/14/13 0214 07/14/13 0403 07/14/13 0718  GLUCAP 86 74 87 85 93   Iron Studies: No results  found for this basename: IRON, TIBC, TRANSFERRIN, FERRITIN,  in the last 72 hours @lablastinr3 @ Studies/Results: No results found. Medications: . dextrose 50 mL (07/14/13 0537)   . antiseptic oral rinse  15 mL Mouth Rinse q12n4p  . ceFEPime (MAXIPIME) IV  2 g Intravenous Q T,Th,Sat-1800  . chlorhexidine  15 mL Mouth Rinse BID  . enoxaparin (LOVENOX) injection  30 mg Subcutaneous Q24H  . insulin aspart  0-9 Units Subcutaneous 6 times per day  . midodrine  10 mg Oral 3 times per day  . vancomycin  750 mg Intravenous Q T,Th,Sa-HD

## 2013-07-14 NOTE — Evaluation (Signed)
Physical Therapy Evaluation Patient Details Name: [name] MRN: [id number] DOB: 1931-10-30 Today's Date: 07/14/2013 Time: [id number] PT Time Calculation (min): 30 min  PT Assessment / Plan / Recommendation History of Present Illness  [name] is a 80 y.o. male with a past medical history of CAD, diabetes mellitus and end-stage renal disease who just had a fistula placed in the right thigh and dialysis catheter placed a few days ago who normally lives in a nursing facility and is able to ambulate, interactive and has a generally good quality life who for the last few days has been having decreased by mouth intake and less communication and then was minimally responsive. Pt with pulmonary edema. concern for sacral wound as cause for septic shock.   Clinical Impression  Patient with acute change in mental status per notes.  Currently not able to participate in therapy, however will see on trial basis to monitor if there is improvement in mental status to allow participation.  May benefit from skilled PT in the acute setting and follow up PT in SNF setting to address decreased mobility due to deficits noted below in problem list.    PT Assessment  Patient needs continued PT services    Follow Up Recommendations  SNF          Equipment Recommendations  None recommended by PT    Recommendations for Other Services   None  Frequency Min 2X/week    Precautions / Restrictions Precautions Precautions: Fall;Other (comment) (NPO)   Pertinent Vitals/Pain Occasional grimace with ROM, trunk extension      Mobility  Bed Mobility Overal bed mobility: Needs Assistance Bed Mobility: Supine to Sit;Sit to Supine Supine to sit: Total assist Sit to supine: Total assist General bed mobility comments: Assisted to sit on edge of bed after traction to LE's to get them off bed; to supine lowered trunk and lifted feet into bed Transfers Overall transfer level: Needs assistance Equipment  used: None Transfers: Lateral/Scoot Transfers  Lateral/Scoot Transfers: Total assist General transfer comment: scoot pivot along edge of bed towards head of bed x 2 with limited patient particiipation    Exercises Low Level/ICU Exercises Heel Slides: PROM;Both;Other (comment);Supine Shoulder Flexion: PROM;Supine;Other reps (comment) Elbow Flexion: PROM;Supine;Other reps (comment)   PT Diagnosis: Altered mental status  PT Problem List: Decreased range of motion;Decreased balance;Decreased mobility;Decreased cognition;Impaired tone PT Treatment Interventions: Therapeutic exercise;Functional mobility training;Therapeutic activities;Patient/family education;Cognitive remediation;Balance training     PT Goals(Current goals can be found in the care plan section) Acute Rehab PT Goals Patient Stated Goal: Unable to state PT Goal Formulation: Patient unable to participate in goal setting Time For Goal Achievement: 07/28/13 Potential to Achieve Goals: Poor  Visit Information  Last PT Received On: 07/14/13 Assistance Needed: +2 History of Present Illness: [name] is a 80 y.o. male with a past medical history of CAD, diabetes mellitus and end-stage renal disease who just had a fistula placed in the right thigh and dialysis catheter placed a few days ago who normally lives in a nursing facility and is able to ambulate, interactive and has a generally good quality life who for the last few days has been having decreased by mouth intake and less communication and then was minimally responsive. Pt with pulmonary edema. concern for sacral wound as cause for septic shock.        Prior Functioning  Home Living Family/patient expects to be discharged to:: Skilled nursing facility [facility] Prior Function Comments: patient unable to  give history; notes in chart report he was able to ambulate at facility Communication Communication: Expressive difficulties (does not respond to  questions; did yell out when I moved him once; "Hey")    Cognition  Cognition Arousal/Alertness: Lethargic Behavior During Therapy: Flat affect Overall Cognitive Status: Impaired/Different from baseline Area of Impairment: Following commands Following Commands:  (does not follow commands) General Comments: generally minimally responsive with only brief moments of arousal    Extremity/Trunk Assessment Upper Extremity Assessment Upper Extremity Assessment: RUE deficits/detail;LUE deficits/detail RUE Deficits / Details: PROM/AAROM limited due to cogwheel rigidity and generally adducted and flexed positioning throughout extremities; able to extend with traction and increased time to about 70% of full motion LUE Deficits / Details: PROM/AAROM limited due to cogwheel rigidity and generally adducted and flexed positioning throughout extremities; able to extend with traction and increased time to about 70% of full motion Lower Extremity Assessment Lower Extremity Assessment: RLE deficits/detail;LLE deficits/detail RLE Deficits / Details: PROM/AAROM limited due to cogwheel rigidity and generally adducted and flexed positioning throughout extremities; able to extend with traction and increased time to about 70% of full motion LLE Deficits / Details: PROM/AAROM limited due to cogwheel rigidity and generally adducted and flexed positioning throughout extremities; able to extend with traction and increased time to about 70% of full motion Cervical / Trunk Assessment Cervical / Trunk Assessment: Kyphotic;Other exceptions Cervical / Trunk Exceptions: cervical and head flexion; cannot manually bring to neutral position (even in full supine head lifted off pillows)   Balance Balance Overall balance assessment: Needs assistance Sitting-balance support: Feet supported;No upper extremity supported Sitting balance-Leahy Scale: Poor Sitting balance - Comments: sat edge of bed about 2-3 minutes with intermitted  support balanced briefly with supervision, but over time head in flexion pulls pt forward, does attempt to lift himself up, but falls back due to unable to right head Postural control: Other (comment) (anterior lean with head/neck and trunk flexion)  End of Session PT - End of Session Equipment Utilized During Treatment: Gait belt Activity Tolerance: Patient limited by lethargy Patient left: in bed;with call bell/phone within reach;with bed alarm set  GP     [facility] 07/14/2013, 11:41 AM Sheran Lawless, PT [phone] 07/14/2013

## 2013-07-14 NOTE — Progress Notes (Signed)
CRITICAL VALUE ALERT  Critical value received:  Blood culture taken on 07-11-2013, gram positive cocci in clusters & gram neg rods  Date of notification:  07-14-2013  Time of notification:  12:00  Critical value read back:yes  Nurse who received alert:  Meridee ScoreAndy Ahmira Boisselle, RN

## 2013-07-14 NOTE — Progress Notes (Signed)
Speech Language Pathology Treatment:    Patient Details Name: [name] MRN: [id number] DOB: 1932-05-02 Today's Date: 07/14/2013 Time:  -     Assessment / Plan / Recommendation Clinical Impression  Pt seen for PO trials prior to decision for comfort measures. SLP completed oral care, provided mostly tactile cueing for pt to accept crushed BP meds provided by RN. Pt accepted bolus with slow pumping and eventual transit without evidence of aspiration. Since treatment session, SLP orders have been d/c'd as pt is transitioning to comfort measures. Recommend comfort feeds. Pt may be able to accept oral meds crushed in puree at this point if needed.    HPI HPI: [name] is a 80 y.o. male with a past medical history of CAD, diabetes mellitus and end-stage renal disease who just had a fistula placed in the right thigh and dialysis catheter placed a few days ago who normally lives in a nursing facility and is able to ambulate, interactive and has a generally good quality life who for the last few days has been having decreased by mouth intake and less communication and then was minimally responsive. Pt with pulmonary edema. concern for sacral wound as cause for septic shock.    Pertinent Vitals NA  SLP Plan  Discharge SLP treatment due to (comment) (Pt transitioning to comfort care, orders D/C'd)    Recommendations Medication Administration: Crushed with puree              Follow up Recommendations: Skilled Nursing facility Plan: Discharge SLP treatment due to (comment) (Pt transitioning to comfort care, orders D/C'd)    GO    Harlon DittyBonnie Brigitte Soderberg, MA CCC-SLP [phone]  [name], Riley NearingBonnie Caroline 07/14/2013, 2:20 PM

## 2013-07-14 NOTE — Progress Notes (Signed)
[facility] PROGRESS NOTE  Lasandra BeechJoseph N Placke ZOX:[id number] DOB: Oct 25, 1931 DOA: 07/11/2013 PCP: Bufford SpikesEED, TIFFANY, DO  Brief narrative: 80 y.o. male  With past medical history of CAD, diabetes mellitus and end-stage renal disease, recent fistula placed in the right thigh and dialysis catheter placed a few days prior to this admission who [facility] 07[facility]11/2013 with acute onset altered mental status. In ED, patient was hypotensive with SBP in 80's somewhat improved with IV fluids. He was found to have a sodium of 150, renal function consistent with end-stage renal disease, mildly elevated troponin at 0.62 and a white count of 13.6. Chest x-ray showed no acute cardiopulmonary findings, he did have however pulmonary vascular congestion. Additionally, pt was noted to have a sacral decubitus ulcer. Hospital course is complicated due to ongoing low blood pressure and palliative care has net with the family to address goals of care. All are in agreement to focus on comfort and res hospice placement.   Assessment/Plan:   Principal Problem:  Severe Sepsis with hypotension  - Criteria of sepsis satisfied with initial and ongoing hypotension, Increase respiratory rate, elevated procalcitonin at 15 and evidence of bacteremia. Possible additional source of sepsis is sacral decubitus - Gram pos cocci in 2 sets of blood cultures and gram neg in 1 set of blood culture. Those were obtained 07/11/2013. Continue cefepime for now. Vanco not seen as ordered but would have been given with HD. Since he will not have HD he wont be getting vancomycin. Cefepime will be stopped prior to discahrge - unable to give midodrine due to poor swallow, poor PO intake - now focus on comfort care per family wishes Active Problems:  Pulmonary edema - will stop further HD as focus is now on comfort ESRD (end stage renal disease) on dialysis:  - nephrology following Altered mental status/toxic metabolic encephalopathy  - likely  due to sepsis - not improving   - focus on comfort - CT head negative  Sacral decubitus ulcer, stage II  - Appreciate wound care consult  Hypernatremia  - Secondary to septic shock and dehydration - IV fluids changed to D10 as pt has no PO intake  DM type 2, uncontrolled, with renal complications:  - minimize CBG check to ensure comfort Elevated troponin  - demand ischemia from renal disease - we will not check blood work to focus on comfort Thrombocytopenia - likely secondary to sepsis - will use SCD's while pt in hospital   Code Status: DNR/DNI Family Communication: PCT to meet with the family for GOC; family not at the bedside this am Disposition Plan: remains inpatient  Consultants:  Wound care  Nephrology Antibiotics:  IV vancomycin and cefepime: 2/23 ->   Manson PasseyEVINE, Wyoma Genson, MD  [facility] Pager [phone]  If 7PM-7AM, please contact night-coverage [email or web address] Password [email or web address] 07/14/2013, 10:58 AM   LOS: 3 days    HPI/Subjective: No acute overnight events.   Objective: Filed Vitals:   07/13/13 1611 07/13/13 1722 07/13/13 2025 07/14/13 0405  BP: 96/43 90/50 88/51  99/57  Pulse: 85 87 88 92  Temp:  98.3 F (36.8 C) 98.5 F (36.9 C) 97.8 F (36.6 C)  TempSrc:  Oral Oral Oral  Resp: 31 20 22 20   Height:   5\' 6"  (1.676 m)   Weight:   71.6 kg (157 lb 13.6 oz)   SpO2: 97% 95% 95% 98%    Intake/Output Summary (Last 24 hours) at 07/14/13 1058 Last data filed at 07/13/13 1500  Gross per 24 hour  Intake    100 ml  Output      0 ml  Net    100 ml    Exam:   General:  Pt is sleeping, responds to verbal stimuli but does not follow commands  Cardiovascular: Regular rate and rhythm, S1/S2 appreciated   Respiratory: diminished breath sounds, no wheezing   Abdomen: Soft, non tender, non distended, bowel sounds present, no guarding  Extremities: No edema, pulses DP and PT palpable bilaterally  Neuro: Grossly nonfocal  Data Reviewed: Basic Metabolic  Panel:  Recent Labs Lab 07/11/13 1300 07/12/13 0132 07/13/13 0244 07/14/13 0330  NA 150* 149* 145 146  K 3.3* 3.6* 3.3* 3.6*  CL 106 106 101 103  CO2 31 26 28 26   GLUCOSE 97 86 70 76  BUN 52* 60* 25* 42*  CREATININE 5.81* 6.34* 3.41* 5.02*  CALCIUM 10.9* 10.6* 9.5 10.4   Liver Function Tests:  Recent Labs Lab 07/11/13 1300 07/12/13 0132 07/13/13 0244  AST 17 15 18   ALT 11 11 12   ALKPHOS 77 75 110  BILITOT 0.9 0.8 1.1  PROT 6.0 5.7* 6.0  ALBUMIN 2.2* 2.1* 2.1*   No results found for this basename: LIPASE, AMYLASE,  in the last 168 hours  Recent Labs Lab 07/11/13 1300  AMMONIA 47   CBC:  Recent Labs Lab 07/11/13 1300 07/12/13 0132 07/13/13 0244 07/14/13 0330  WBC 13.6* 13.7* 18.8* 15.0*  NEUTROABS 11.8*  --   --   --   HGB 12.6* 12.0* 12.5* 12.3*  HCT 38.4* 36.9* 38.3* 37.8*  MCV 87.1 88.5 88.2 87.7  PLT 45* 58* 46* 57*   Cardiac Enzymes:  Recent Labs Lab 07/11/13 1300 07/12/13 0023 07/12/13 0132  TROPONINI 0.62* 0.54* 0.47*   BNP: No components found with this basename: POCBNP,  CBG:  Recent Labs Lab 07/13/13 2134 07/14/13 07/14/13 0214 07/14/13 0403 07/14/13 0718  GLUCAP 86 74 87 85 93    CULTURE, BLOOD (ROUTINE X 2)     Status: None   Collection Time    07/11/13  4:20 PM      Result Value Ref Range Status   Specimen Description BLOOD RIGHT ARM   Final   Value: GRAM POSITIVE COCCI IN CLUSTERS   Report Status PENDING   Incomplete  CULTURE, BLOOD (ROUTINE X 2)     Status: None   Collection Time    07/11/13  4:30 PM      Result Value Ref Range Status   Specimen Description BLOOD RIGHT HAND   Final   Value: GRAM NEGATIVE RODS     GRAM POSITIVE COCCI IN CLUSTERS   Report Status PENDING   Incomplete  MRSA PCR SCREENING     Status: Abnormal   Collection Time    07/11/13  7:37 PM      Result Value Ref Range Status   MRSA by PCR INVALID RESULTS, SPECIMEN SENT FOR CULTURE (*) NEGATIVE Final     Studies: No results  found.  Scheduled Meds: . antiseptic oral rinse  15 mL Mouth Rinse q12n4p  . ceFEPime (MAXIPIME) IV  2 g Intravenous Q T,Th,Sat-1800  . chlorhexidine  15 mL Mouth Rinse BID  . enoxaparin (LOVENOX) injection  30 mg Subcutaneous Q24H  . insulin aspart  0-9 Units Subcutaneous 6 times per day  . midodrine  10 mg Oral 3 times per day  . vancomycin  750 mg Intravenous Q T,Th,Sa-HD   Continuous Infusions: . dextrose 50 mL (07/14/13 0537)

## 2013-07-14 NOTE — Progress Notes (Signed)
Hypoglycemic Event  CBG: 68  Treatment: D50 IV 25 mL  Symptoms: None  Follow-up CBG: Time:20:41 CBG Result:120  Possible Reasons for Event: Other: Patient is NPO  Comments/MD notified:NP on call notified, will continue to monitor patient.    Leim FabryBirago, Ewan Grau  Remember to initiate Hypoglycemia Order Set & complete

## 2013-07-15 LAB — CULTURE, BLOOD (ROUTINE X 2)

## 2013-07-15 MED ORDER — MORPHINE SULFATE 20 MG/5ML PO SOLN
5.0000 mg | ORAL | Status: AC | PRN
Start: 2013-07-15 — End: ?

## 2013-07-15 MED ORDER — DEXTROSE 5 % IV SOLN
1.0000 g | INTRAVENOUS | Status: DC
  Filled 2013-07-15: qty 1

## 2013-07-15 MED ORDER — LORAZEPAM 0.5 MG PO TABS: 0.5000 mg | ORAL_TABLET | ORAL | Status: AC | PRN

## 2013-07-15 MED ORDER — CHLORHEXIDINE GLUCONATE 0.12 % MT SOLN: 15.0000 mL | Freq: Two times a day (BID) | OROMUCOSAL | Status: AC

## 2013-07-15 MED ORDER — BIOTENE DRY MOUTH MT LIQD: 15.0000 mL | Freq: Two times a day (BID) | OROMUCOSAL | Status: AC

## 2013-07-15 MED ORDER — ACETAMINOPHEN 650 MG RE SUPP: 650.0000 mg | Freq: Four times a day (QID) | RECTAL | Status: AC | PRN

## 2013-07-15 NOTE — Clinical Social Work Note (Signed)
[facility] accepted patient to their Hospice facility today. Discharge information transmitted to facility and hospice liaison made contact with family regarding paperwork.  CSW facilitated transport to hospice facility via ambulance. Family aware of today's discharge.  Genelle BalVanessa Merinda Victorino, MSW, LCSW [phone]

## 2013-07-15 NOTE — Progress Notes (Signed)
ANTIBIOTIC CONSULT NOTE - FOLLOW UP  Pharmacy Consult for Cefepime Indication: bacteremia  No Known Allergies  Patient Measurements: Height: 5\' 6"  (167.6 cm) Weight: 160 lb 7.9 oz (72.8 kg) IBW/kg (Calculated) : 63.8  Vital Signs: Temp: 98.2 F (36.8 C) (02/27 0749) Temp src: Oral (02/27 0749) BP: 90/55 mmHg (02/27 0749) Pulse Rate: 64 (02/27 0749) Intake/Output from previous day: 02/26 0701 - 02/27 0700 In: 100 [I.V.:100] Out: 0  Intake/Output from this shift:    Labs:  Recent Labs  07/13/13 0244 07/14/13 0330  WBC 18.8* 15.0*  HGB 12.5* 12.3*  PLT 46* 57*  CREATININE 3.41* 5.02*   Estimated Creatinine Clearance: 10.8 ml/min (by C-G formula based on Cr of 5.02).    Microbiology: 2/23 blood cx x 2 growing CNS and pseudomonas (S to Cefepime)  Assessment: 80 yo M with ESRD previously receiving HD, now with transitions to Hospice care and no further HD sessions planned.  Noted update culture data and plans to continue antibiotics while inpatient.  Will adjust dosing for CrCl <10 rather than HD dosing.  Goal of Therapy:  Renal dose adjustment of medications  Plan:  Change Cefepime to 1gm IV q24h. RX will sign off.  No further dose adjustments anticipated.  Toys 'R' UsKimberly Keni Elison, Pharm.D., BCPS Clinical Pharmacist Pager [phone] 07/15/2013 11:17 AM

## 2013-07-15 NOTE — Discharge Summary (Signed)
Physician Discharge Summary  [name] ZOX:[id number] DOB: Sep 04, 1931 DOA: 07/11/2013  PCP: Bufford Spikes, DO  Admit date: 07/11/2013 Discharge date: 07/15/2013  Recommendations for Outpatient Follow-up:  To residential hospice  F/U with PCP as needed  Discharge Diagnoses:  Principal Problem:   Severe sepsis(995.92) Active Problems:   Pulmonary edema   Hypotension, unspecified   ESRD (end stage renal disease) on dialysis   Vascular dementia with depression   Altered mental status   Sacral decubitus ulcer, stage II   Hypernatremia   DM type 2, uncontrolled, with renal complications   Elevated troponin   Toxic metabolic encephalopathy   Palliative care encounter   DNR (do not resuscitate)    Discharge Condition: medically stable for discharge to res hospice today   Diet recommendation: as tolerated, comfort feeds if family prefers   History of present illness:  80 y.o. male with past medical history of CAD, diabetes mellitus and end-stage renal disease, recent fistula placed in the right thigh and dialysis catheter placed a few days prior to this admission who presented to [facility] ED 07/11/2013 with acute onset altered mental status.  In ED, patient was hypotensive with SBP in 80's somewhat improved with IV fluids. He was found to have a sodium of 150, renal function consistent with end-stage renal disease, mildly elevated troponin at 0.62 and a white count of 13.6. Chest x-ray showed no acute cardiopulmonary findings, he did have however pulmonary vascular congestion. Additionally, pt was noted to have a sacral decubitus ulcer. Hospital course is complicated due to ongoing low blood pressure and palliative care has net with the family to address goals of care. All are in agreement to focus on comfort and res hospice placement.   Assessment/Plan:   Principal Problem:  Severe Sepsis with hypotension  - Criteria of sepsis satisfied with initial and ongoing hypotension, Increase  respiratory rate, elevated procalcitonin at 15 and evidence of bacteremia. Possible additional source of sepsis is sacral decubitus  - Gram pos cocci in 2 sets of blood cultures and gram neg in 1 set of blood culture. Those were obtained 07/11/2013. - pt was vanco and cefepime. We will stop all antibiotics prior to discharge  - unable to give midodrine due to poor swallow, poor PO intake  - now focus on comfort care per family wishes   Active Problems:  Pulmonary edema  - no further HD as focus is now on comfort  ESRD (end stage renal disease) on dialysis:  - nephrology following while pt in hospital  Altered mental status/toxic metabolic encephalopathy  - likely due to sepsis  - not improving  - focus on comfort  - CT head negative  Sacral decubitus ulcer, stage II  - Appreciate wound care consult  Hypernatremia  - Secondary to septic shock and dehydration  - IV fluids changed to D10 as pt has no PO intake  - no further IV fluids  DM type 2, uncontrolled, with renal complications:  - minimize CBG check to ensure comfort  Elevated troponin  - demand ischemia from renal disease  - we will not check blood work to focus on comfort  Thrombocytopenia  - likely secondary to sepsis  - used SCD's while pt in hospital   Code Status: DNR/DNI  Family Communication: family not at the bedside this am   Consultants:  Wound care  Nephrology Antibiotics:  IV vancomycin and cefepime: 2/23 -> 07/15/2013   Signed:  Manson Passey, MD  [facility] 07/15/2013, 11:38 AM  Pager #: [phone]    Discharge Exam: Filed Vitals:   07/15/13 0749  BP: 90/55  Pulse: 64  Temp: 98.2 F (36.8 C)  Resp: 18   Filed Vitals:   07/14/13 1200 07/14/13 1213 07/14/13 2133 07/15/13 0749  BP: 73/47 69/40 90/38  90/55  Pulse:   79 64  Temp:   98.3 F (36.8 C) 98.2 F (36.8 C)  TempSrc:   Oral Oral  Resp:   18 18  Height:      Weight:   72.8 kg (160 lb 7.9 oz)   SpO2:   99% 98%   Exam:   General: Pt is sleeping, responds to verbal stimuli but does not follow commands  Cardiovascular: Regular rate and rhythm, S1/S2 appreciated  Respiratory: diminished breath sounds, no wheezing  Abdomen: Soft, non tender, non distended, bowel sounds present, no guarding  Extremities: No edema, pulses DP and PT palpable bilaterally  Neuro: Grossly nonfocal    Discharge Instructions  Discharge Orders   Future Appointments Provider Department Dept Phone   07/27/2013 9:45 AM Chuck Hint, MD [facility] [phone]   09/19/2013 1:30 PM Lenn Sink, DPM [facility] [phone]   Future Orders Complete By Expires   Call MD for:  difficulty breathing, headache or visual disturbances  As directed    Call MD for:  persistant dizziness or light-headedness  As directed    Call MD for:  persistant nausea and vomiting  As directed    Call MD for:  redness, tenderness, or signs of infection (pain, swelling, redness, odor or green/yellow discharge around incision site)  As directed    Diet - low sodium heart healthy  As directed    Increase activity slowly  As directed        Medication List    STOP taking these medications       acetaminophen 650 MG CR tablet  Commonly known as:  TYLENOL  Replaced by:  acetaminophen 650 MG suppository     calcium acetate 667 MG capsule  Commonly known as:  PHOSLO     cinacalcet 90 MG tablet  Commonly known as:  SENSIPAR     diphenhydrAMINE 25 MG tablet  Commonly known as:  BENADRYL     docusate sodium 100 MG capsule  Commonly known as:  COLACE     escitalopram 20 MG tablet  Commonly known as:  LEXAPRO     feeding supplement (PRO-STAT SUGAR FREE 64) Liqd     lanthanum 1000 MG chewable tablet  Commonly known as:  FOSRENOL     loperamide 2 MG capsule  Commonly known as:  IMODIUM     midodrine 10 MG tablet  Commonly known as:  PROAMATINE     mirtazapine 7.5 MG tablet  Commonly  known as:  REMERON     multivitamin Tabs tablet     omeprazole 20 MG capsule  Commonly known as:  PRILOSEC     oxyCODONE 5 MG immediate release tablet  Commonly known as:  Oxy IR/ROXICODONE     risperiDONE 0.5 MG tablet  Commonly known as:  RISPERDAL     simvastatin 20 MG tablet  Commonly known as:  ZOCOR      TAKE these medications       acetaminophen 650 MG suppository  Commonly known as:  TYLENOL  Place 1 suppository (650 mg total) rectally every 6 (six) hours as needed for fever or mild pain.     antiseptic  oral rinse Liqd  15 mLs by Mouth Rinse route 2 times daily at 12 noon and 4 pm.     bisacodyl 10 MG suppository  Commonly known as:  DULCOLAX  Place 10 mg rectally daily as needed (for no bowel movement).     calamine lotion  Apply 1 application topically as needed for itching.     chlorhexidine 0.12 % solution  Commonly known as:  PERIDEX  15 mLs by Mouth Rinse route 2 (two) times daily.     LORazepam 0.5 MG tablet  Commonly known as:  ATIVAN  Place 1 tablet (0.5 mg total) under the tongue every 4 (four) hours as needed for anxiety.     morphine 20 MG/5ML solution  Take 1.3 mLs (5.2 mg total) by mouth every 4 (four) hours as needed for pain.           Follow-up Information   Follow up with REED, TIFFANY, DO. (As needed)    Specialty:  Geriatric Medicine   Contact information:   [address] [place] [address] [phone]        The results of significant diagnostics from this hospitalization (including imaging, microbiology, ancillary and laboratory) are listed below for reference.    Significant Diagnostic Studies: Ct Head Wo Contrast 07/11/2013   CLINICAL DATA:  Decreased level of consciousness.  Weakness.  EXAM: CT HEAD WITHOUT CONTRAST  TECHNIQUE: Contiguous axial images were obtained from the base of the skull through the vertex without intravenous contrast.  COMPARISON:  CT head without contrast 03/26/2011  FINDINGS: Moderate  generalized atrophy and white matter disease is similar to the prior study. A remote infarct of the left frontal lobe is again seen. Remote lacunar infarcts in the basal ganglia bilaterally are similar to the prior study.  No acute cortical infarct, hemorrhage, or mass lesion is present. The ventricles are proportionate to the degree of atrophy. No significant extra-axial fluid collection is present.  The paranasal sinuses are clear. There is some fluid in the dependent left mastoid air cells. The remaining paranasal sinuses are clear. Atherosclerotic calcifications are present within the cavernous carotid arteries bilaterally.  IMPRESSION: 1. Stable atrophy and diffuse white matter disease, advanced for age. This likely reflects the sequela of chronic microvascular ischemia. 2. Remote left frontal lobe infarct. 3. No acute intracranial abnormality. 4. Atherosclerosis.   Electronically Signed   By: Gennette Pachris  Mattern M.D.   On: 07/11/2013 13:44   Dg Chest Port 1 View 07/11/2013   CLINICAL DATA:  Lethargy.  EXAM: PORTABLE CHEST - 1 VIEW  COMPARISON:  05/04/2013  FINDINGS: Left central line tips in the region of mid to distal superior vena cava without evidence of gross pneumothorax.  Vascular stent mid to distal humerus level.  Pulmonary vascular congestion/pulmonary edema.  Consolidation left base unchanged. This may represent crowding of vessels/atelectasis secondary to cardiomegaly however, pleural effusion, infiltrate or mass not excluded.  IMPRESSION: Pulmonary vascular congestion/pulmonary edema.  Consolidation left base unchanged. This may represent crowding of vessels/atelectasis secondary to cardiomegaly however, pleural effusion, infiltrate or mass not excluded.   Electronically Signed   By: Bridgett LarssonSteve  Olson M.D.   On: 07/11/2013 12:47    Microbiology: Recent Results (from the past 240 hour(s))  CULTURE, BLOOD (ROUTINE X 2)     Status: None   Collection Time    07/11/13  4:20 PM      Result Value Ref  Range Status   Specimen Description BLOOD RIGHT ARM   Final  Special Requests     Final   Value: BOTTLES DRAWN AEROBIC AND ANAEROBIC 10CC BLUE,5CC RED   Culture  Setup Time     Final   Value: 07/12/2013 00:58     Performed at [facility]   Culture     Final   Value: STAPHYLOCOCCUS SPECIES (COAGULASE NEGATIVE)     Note: RIFAMPIN AND GENTAMICIN SHOULD NOT BE USED AS SINGLE DRUGS FOR TREATMENT OF STAPH INFECTIONS. This organism is presumed to be Clindamycin resistant based on detection of inducible Clindamycin resistance.     PSEUDOMONAS AERUGINOSA     Note: SUSCEPTIBILITIES PERFORMED ON PREVIOUS CULTURE WITHIN THE LAST 5 DAYS.     Note: Gram Stain Report Called to,Read Back By and Verified With: DARA ANDERSON ON 07/13/2013 AT 7:19P BY WILEJ   Report Status 07/15/2013 FINAL   Final   Organism ID, Bacteria STAPHYLOCOCCUS SPECIES (COAGULASE NEGATIVE)   Final  CULTURE, BLOOD (ROUTINE X 2)     Status: None   Collection Time    07/11/13  4:30 PM      Result Value Ref Range Status   Specimen Description BLOOD RIGHT HAND   Final   Special Requests BOTTLES DRAWN AEROBIC ONLY 8CC   Final   Culture  Setup Time     Final   Value: 07/12/2013 00:57     Performed at [facility]   Culture     Final   Value: PSEUDOMONAS AERUGINOSA     STAPHYLOCOCCUS SPECIES (COAGULASE NEGATIVE)     Note: SUSCEPTIBILITIES PERFORMED ON PREVIOUS CULTURE WITHIN THE LAST 5 DAYS.     Note: Gram Stain Report Called to,Read Back By and Verified With: DARA ANDERSON ON 07/12/2013 AT 7:19P BY Serafina Mitchell     Performed at [facility]   Report Status 07/15/2013 FINAL   Final   Organism ID, Bacteria PSEUDOMONAS AERUGINOSA   Final  MRSA PCR SCREENING     Status: Abnormal   Collection Time    07/11/13  7:37 PM      Result Value Ref Range Status   MRSA by PCR INVALID RESULTS, SPECIMEN SENT FOR CULTURE (*) NEGATIVE Final   Comment: RESULT CALLED TO, READ BACK BY AND VERIFIED WITH:     THOMAS,T RN 0027 07/12/13  MITCHELL,L                The GeneXpert MRSA Assay (FDA     approved for NASAL specimens     only), is one component of a     comprehensive MRSA colonization     surveillance program. It is not     intended to diagnose MRSA     infection nor to guide or     monitor treatment for     MRSA infections.     Labs: Basic Metabolic Panel:  Recent Labs Lab 07/11/13 1300 07/12/13 0132 07/13/13 0244 07/14/13 0330  NA 150* 149* 145 146  K 3.3* 3.6* 3.3* 3.6*  CL 106 106 101 103  CO2 31 26 28 26   GLUCOSE 97 86 70 76  BUN 52* 60* 25* 42*  CREATININE 5.81* 6.34* 3.41* 5.02*  CALCIUM 10.9* 10.6* 9.5 10.4   Liver Function Tests:  Recent Labs Lab 07/11/13 1300 07/12/13 0132 07/13/13 0244  AST 17 15 18   ALT 11 11 12   ALKPHOS 77 75 110  BILITOT 0.9 0.8 1.1  PROT 6.0 5.7* 6.0  ALBUMIN 2.2* 2.1* 2.1*   No results found for this basename: LIPASE, AMYLASE,  in the last 168 hours  Recent Labs Lab 07/11/13 1300  AMMONIA 47   CBC:  Recent Labs Lab 07/11/13 1300 07/12/13 0132 07/13/13 0244 07/14/13 0330  WBC 13.6* 13.7* 18.8* 15.0*  NEUTROABS 11.8*  --   --   --   HGB 12.6* 12.0* 12.5* 12.3*  HCT 38.4* 36.9* 38.3* 37.8*  MCV 87.1 88.5 88.2 87.7  PLT 45* 58* 46* 57*   Cardiac Enzymes:  Recent Labs Lab 07/11/13 1300 07/12/13 0023 07/12/13 0132  TROPONINI 0.62* 0.54* 0.47*   BNP: BNP (last 3 results)  Recent Labs  07/11/13 1627  PROBNP >70000.0*   CBG:  Recent Labs Lab 07/13/13 2134 07/14/13 07/14/13 0214 07/14/13 0403 07/14/13 0718  GLUCAP 86 74 87 85 93    Time coordinating discharge: Over 30 minutes

## 2013-07-15 NOTE — Progress Notes (Addendum)
Progress Note from the Palliative Medicine Team at [facility]  Subjective: Mr. Vallin appears to be resting comfortably this morning. He did open his eyes to my voice. The decision was made yesterday to discontinue dialysis and transition to full comfort. He is also not able to swallow and take in any nutrition and failing to thrive. This makes his prognosis at days to weeks. He is awaiting to see if he can get a bed over at [facility] facility. I have placed golden DNR on his chart. He may continue his dextrose infusion and Maxipime until transfer to hospice and then they may be discontinued. I spoke with his son, Tiburcio Bash, who says he and his brother Aurelio Brash have been trying to contact his family and friends to update them on Mr. Pardini status and trying to get affairs in order. Tiburcio Bash says that he does not have any further questions/concerns at this time. I reassure his that he and his brothers are doing a wonderful job supporting their father in the best way they are able. Tiburcio Bash says that he visited with his son yesterday and that Mr. Ciavarella was able to open his eyes and even smile at his grandson and that it was a special moment for them. I will continue to follow and support Mr. Ferencz and his family holistically.   Objective: No Known Allergies Scheduled Meds: . antiseptic oral rinse  15 mL Mouth Rinse q12n4p  . ceFEPime (MAXIPIME) IV  2 g Intravenous Q T,Th,Sat-1800  . chlorhexidine  15 mL Mouth Rinse BID   Continuous Infusions: . dextrose 25 mL/hr at 07/14/13 1233   PRN Meds:.acetaminophen, atropine, bisacodyl, LORazepam, morphine injection, ondansetron (ZOFRAN) IV  BP 90/55  Pulse 64  Temp(Src) 98.2 F (36.8 C) (Oral)  Resp 18  Ht 5\' 6"  (1.676 m)  Wt 72.8 kg (160 lb 7.9 oz)  BMI 25.92 kg/m2  SpO2 98%   PPS: 10%  Pain Score: nonverbal    Intake/Output Summary (Last 24 hours) at 07/15/13 0944 Last data filed at 07/15/13 9604  Gross per 24 hour  Intake    100 ml   Output      0 ml  Net    100 ml      LBM: unknown      Physical Exam:  General: NAD, resting  HEENT: [place]/AT, moist mucous membranes, no JVD, no icterus  Chest: Diminished throughout, shallow breathes with limited air movement, no labored breathing  CVS: RRR, S1 S2  Abdomen: Soft, NT, ND, +BS hypoactive  Ext: No edema, warm to touch, does not move extremities, 1+ DP pulses  Neuro: Opens eyes to voice, unable to follow commands, no purposeful movement  Labs: CBC    Component Value Date/Time   WBC 15.0* 07/14/2013 0330   RBC 4.31 07/14/2013 0330   HGB 12.3* 07/14/2013 0330   HCT 37.8* 07/14/2013 0330   PLT 57* 07/14/2013 0330   MCV 87.7 07/14/2013 0330   MCH 28.5 07/14/2013 0330   MCHC 32.5 07/14/2013 0330   RDW 16.5* 07/14/2013 0330   LYMPHSABS 0.7 07/11/2013 1300   MONOABS 1.1* 07/11/2013 1300   EOSABS 0.0 07/11/2013 1300   BASOSABS 0.0 07/11/2013 1300    BMET    Component Value Date/Time   NA 146 07/14/2013 0330   K 3.6* 07/14/2013 0330   CL 103 07/14/2013 0330   CO2 26 07/14/2013 0330   GLUCOSE 76 07/14/2013 0330   BUN 42* 07/14/2013 0330   CREATININE 5.02* 07/14/2013 0330  CALCIUM 10.4 07/14/2013 0330   GFRNONAA 10* 07/14/2013 0330   GFRAA 11* 07/14/2013 0330    CMP     Component Value Date/Time   NA 146 07/14/2013 0330   K 3.6* 07/14/2013 0330   CL 103 07/14/2013 0330   CO2 26 07/14/2013 0330   GLUCOSE 76 07/14/2013 0330   BUN 42* 07/14/2013 0330   CREATININE 5.02* 07/14/2013 0330   CALCIUM 10.4 07/14/2013 0330   PROT 6.0 07/13/2013 0244   ALBUMIN 2.1* 07/13/2013 0244   AST 18 07/13/2013 0244   ALT 12 07/13/2013 0244   ALKPHOS 110 07/13/2013 0244   BILITOT 1.1 07/13/2013 0244   GFRNONAA 10* 07/14/2013 0330   GFRAA 11* 07/14/2013 0330     Assessment and Plan: 1. Code Status: DNR 2. Symptom Control:  1. Anxiety/Agitation: Lorazepam prn.  2. Pain: Morphine prn.  3. Bowel Regimen: Dulcolax supp prn.  4. Fever: Acetaminophen prn.  5. Nausea/Vomiting: Ondansetron prn.   6. Terminal Secretions: Atropine SL prn.  3. Psycho/Social: Emotional support provided to Mr. Emilee HeroLatham and son, Tiburcio BashReginald, via telephone.  4. Disposition: Hopeful for hospice facility.   Patient Documents Completed or Given: Document Given Completed  Advanced Directives Pkt    MOST    DNR  yes  Gone from My Sight    Hard Choices      Time In Time Out Total Time Spent with Patient Total Overall Time  0850 0915 15min 25min    Greater than 50%  of this time was spent counseling and coordinating care related to the above assessment and plan.  Yong ChannelAlicia Tamrah Victorino, NP Palliative Medicine Team Pager # [phone] (M-F 8a-5p) Team Phone # [phone] (Nights/Weekends)

## 2013-07-15 NOTE — Progress Notes (Signed)
Nutrition Brief Note  Chart reviewed. Pt now transitioning to comfort care.  No further nutrition interventions warranted at this time.  Please re-consult as needed.   [name] MS, RD, LDN Inpatient Registered Dietitian Pager: [phone] After-hours pager: [phone]    

## 2013-07-15 NOTE — Discharge Instructions (Signed)
Hospice Care  °Hospice is a care service which can be used by people who are terminally ill and in whom healing is no longer thought possible. Hospice care is for people believed to have no more than 6 months to live. It is meant to help with the two largest fears near the end of life (the fears of dying and of being alone), as well as pain management, and an attempt to allow people to pass away comfortably at home.  °Hospice staff: °· Administer appropriate pain relief. °· Provide nursing care. °· Offer reassurance and support to loved ones and family members. °· Provide services to keep people comfortable at home or in a hospice facility. °Together, you can see to it that your loved one is not alone during this last and important phase of life. °You, your family, and your caregivers help you decide when hospice services should begin. If your condition improves or the disease goes into remission, you can be discharged from the hospice program. You can return to hospice care at a later time if needed. °The hospice philosophy recognizes death as the final stage of life. It helps patients continue an alert, pain-free life, and manage symptoms while surrounded by their loved ones. Hospice affirms life without hurrying death. Hospice care treats the person rather than the disease. It emphasizes quality of life with family-centered care. Hospice care involves the patient and family and helps them make decisions.  °The care is designed to: °· Relieve or decrease pain. °· Control other problems. °· Provide as much quality time as possible. °· Allow people to die with dignity. °Unlike other medical care, the focus is no longer on curing disease. The goal of hospice care is to offer as high a quality of life as possible during the end of life. In this way, the last days of life may be spent with dignity.  °With hospice care, instead of spending the last weeks or months in a hospital, a person is with loved ones in the home  or a homelike setting. About 90 percent of hospice care is provided at home. But hospice is available wherever a person lives, including a nursing home or assisted-living residence. Some residential hospices designed specifically for hospice care also exist. Hospice care is available for many types of terminal illnesses. °Hospice services are meant to serve both the patient and family members. °· Comfort. In most cases, the individual stays in his or her home or in homelike surroundings instead of in a hospital. The core of hospice is a cooperative effort by family, friends and a team of professional and volunteer caregivers working together to meet your loved one's needs. This team supplies all necessary medicines and equipment. It works with both the person involved and family members to relieve pain and symptoms. °· Support. Individuals enjoy the support of loved ones by receiving much of the basic care from family and friends. A nurse may lead the team and coordinates the day-to-day care. A doctor is also part of the team. Chaplains and social workers are available to counsel the family and their loved one. They make sure emotional, spiritual, and social needs are being met. Trained volunteers perform a wide variety of tasks as needed, such as: °· Providing companionship. °· Doing light housekeeping. °· Preparing meals. °· Running errands. °· Providing respite for the family. °· Improving quality of life. Caring for someone who is dying is emotionally and physically demanding. This can be particularly true for family members   who are primary caregivers. But you can take comfort in knowing that hospice is an act of love that can improve the quality of life for all involved. Professionals are often available to tend to the needs of grieving family members as well.  Spiritual Care. Hospice care emphasizes the spiritual needs of you and your family. People differ in their spiritual needs and religious beliefs so  spiritual care is individualized to meet the persons' and family's needs. It may include helping you to look at what death means to you, to say good-bye, or to perform a specific religious ceremony or ritual. HOW TO SELECT A PROGRAM Most hospice programs are run by nonprofit, independent organizations. Some are affiliated with hospitals, nursing homes or home health care agencies. Some are for-profit organizations. You can learn about existing hospice programs in your area from your health caregivers. ASK THE FOLLOWING:  What services are available to the patient?  What services are offered to the family?  Are bereavement services available?  How involved are the family members?  How involved is the doctor?  Who makes up the hospice care team? How are they trained or screened?  How will the individual's pain and symptoms be managed?  If circumstances change, can services be provided in different settings, such as the home or the hospital?  Is the program reviewed and licensed by the state or certified in some other way?  Are all costs covered by insurance? How much you pay for hospice care can vary greatly. It depends on the length and type of care necessary and your insurance coverage. Medicare and most private insurance plans, including managed care organizations, cover hospice care. Hospice is also covered by Medicaid in most states. Some hospice programs provide services on a sliding fee scale, based on your ability to pay. They may also provide some durable medical equipment for support within the home. Document Released: 08/22/2003 Document Revised: 07/28/2011 Document Reviewed: 05/05/2005 [facility] Patient Information 2014 [place], [place].

## 2013-07-26 ENCOUNTER — Encounter: Payer: Self-pay | Admitting: Vascular Surgery

## 2013-07-27 ENCOUNTER — Telehealth: Payer: Self-pay | Admitting: Vascular Surgery

## 2013-07-27 ENCOUNTER — Encounter: Payer: Medicare Other | Admitting: Vascular Surgery

## 2013-07-27 NOTE — Telephone Encounter (Signed)
Message copied by Fredrich BirksMILLIKAN, DANA P on Wed Jul 27, 2013  2:57 PM ------      Message from: Kinnie ScalesOUX, BONNIE A      Created: Wed Jul 27, 2013  2:25 PM      Regarding: Deceased Patient       I was following up on a no show today.  [facility] on [facility]-13 but that he had passed recently.              I call the patient's son, [name], at [phone] and he confirmed that his father, [name], [birth year]b November 14, 1931, did pass on 2014-02-14. ------

## 2013-08-02 NOTE — Consult Note (Signed)
I have reviewed and discussed the care of this patient in detail with the nurse practitioner including pertinent patient records, physical exam findings and data. I agree with details of this encounter.  

## 2013-08-17 DEATH — deceased

## 2013-09-19 ENCOUNTER — Ambulatory Visit: Payer: Medicare Other | Admitting: Podiatry

## 2013-10-18 ENCOUNTER — Encounter: Payer: Self-pay | Admitting: Internal Medicine

## 2015-02-18 IMAGING — CR DG CHEST 1V PORT
1 series · 1 of 1 positions shown · non-contrast
Comparison: Chest radiograph performed 12/05/2010, and CT of the
chest performed 03/16/2013

CLINICAL DATA: Postoperative radiograph; left Diatek placement.

EXAM:
PORTABLE CHEST - 1 VIEW

[AP]
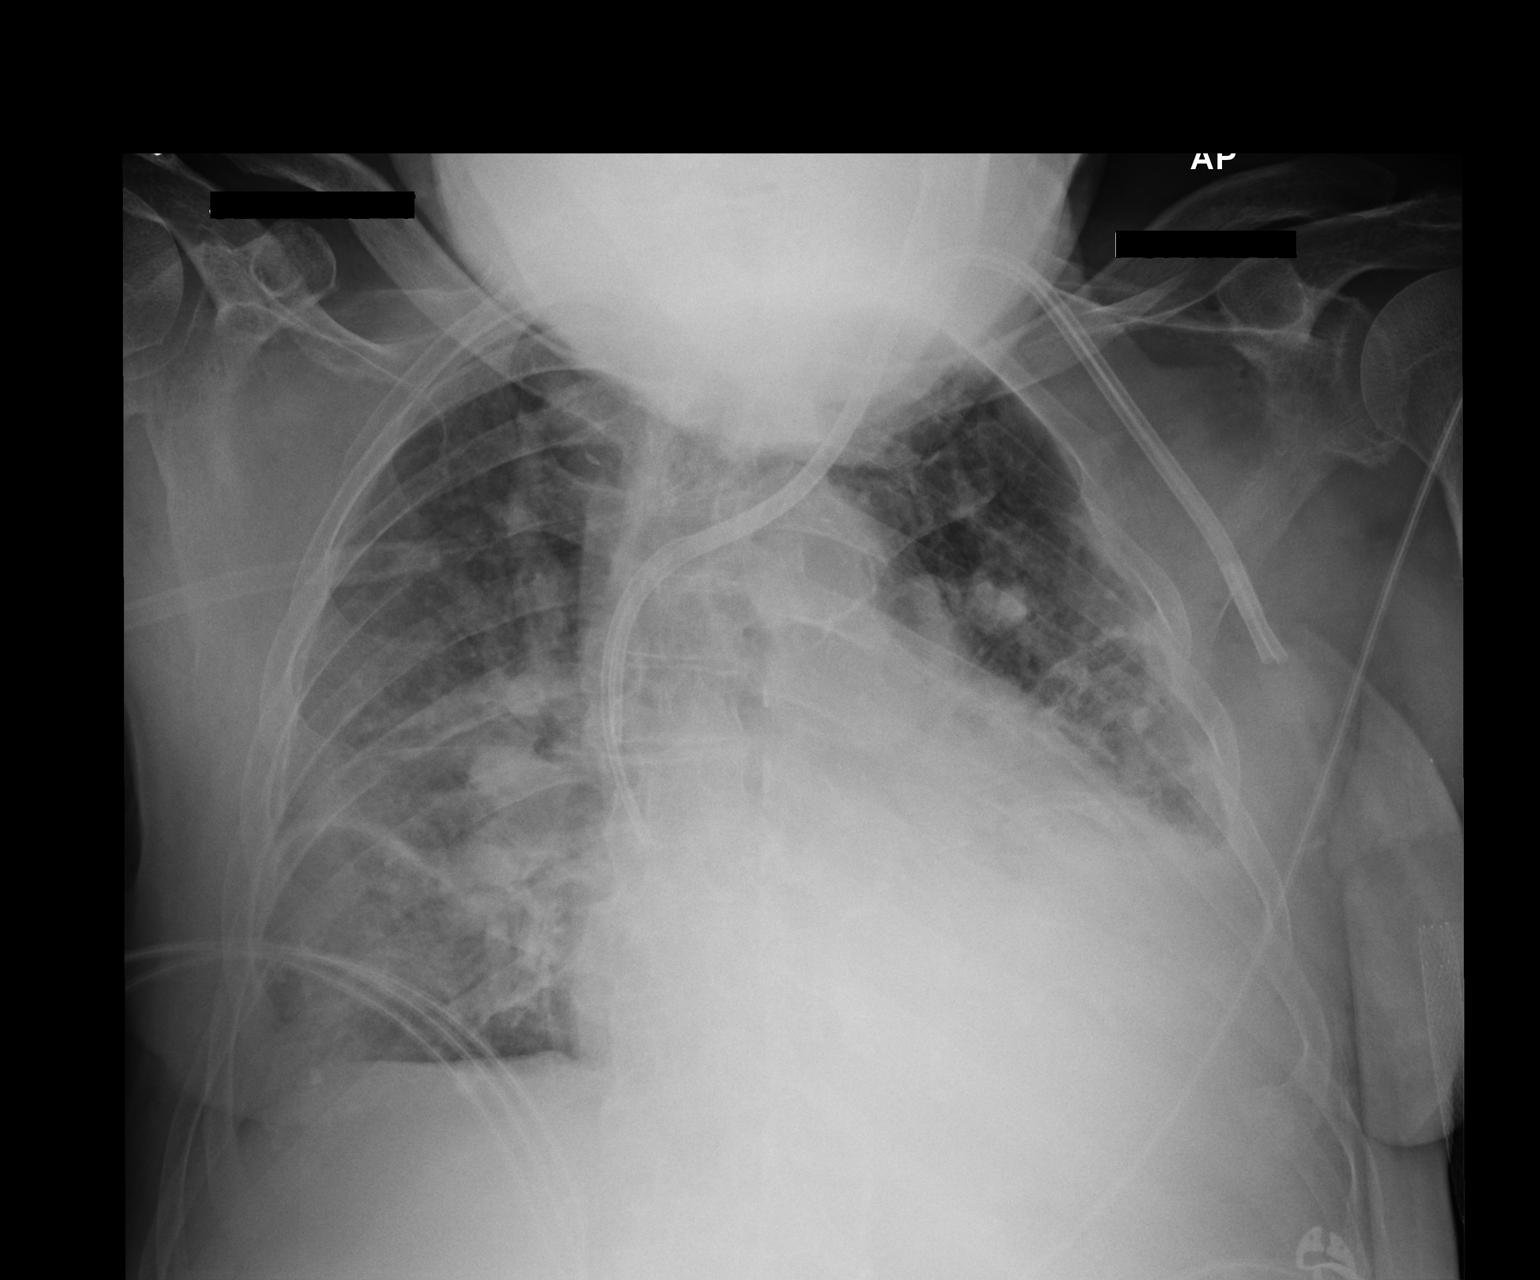

[1 of 1 positions shown; findings below may reference images not displayed]

FINDINGS: The patient's left-sided dual-lumen catheter is noted ending about
the distal SVC.

There is an increased moderate left-sided pleural effusion, and a
small right pleural effusion. Associated bilateral airspace
opacification may reflect pulmonary edema or pneumonia. No
pneumothorax is seen, though the lung apices are obscured by the
patient's head.

The cardiomediastinal silhouette is borderline enlarged. No acute
osseous abnormalities are identified.
IMPRESSION: 1. Left-sided dual-lumen catheter noted ending about the distal SVC.
2. Increased moderate left-sided pleural effusion, and small right
pleural effusion. Associated diffuse bilateral airspace
opacification may reflect pulmonary edema or pneumonia.
3. Borderline cardiomegaly.

## 2015-02-22 IMAGING — CR DG CHEST 2V
2 series · 2 of 2 positions shown · non-contrast
Comparison: 04/26/2013

CLINICAL DATA: Bleeding from the left-sided hemodialysis catheter.

EXAM:
CHEST  2 VIEW

[w chest lat]
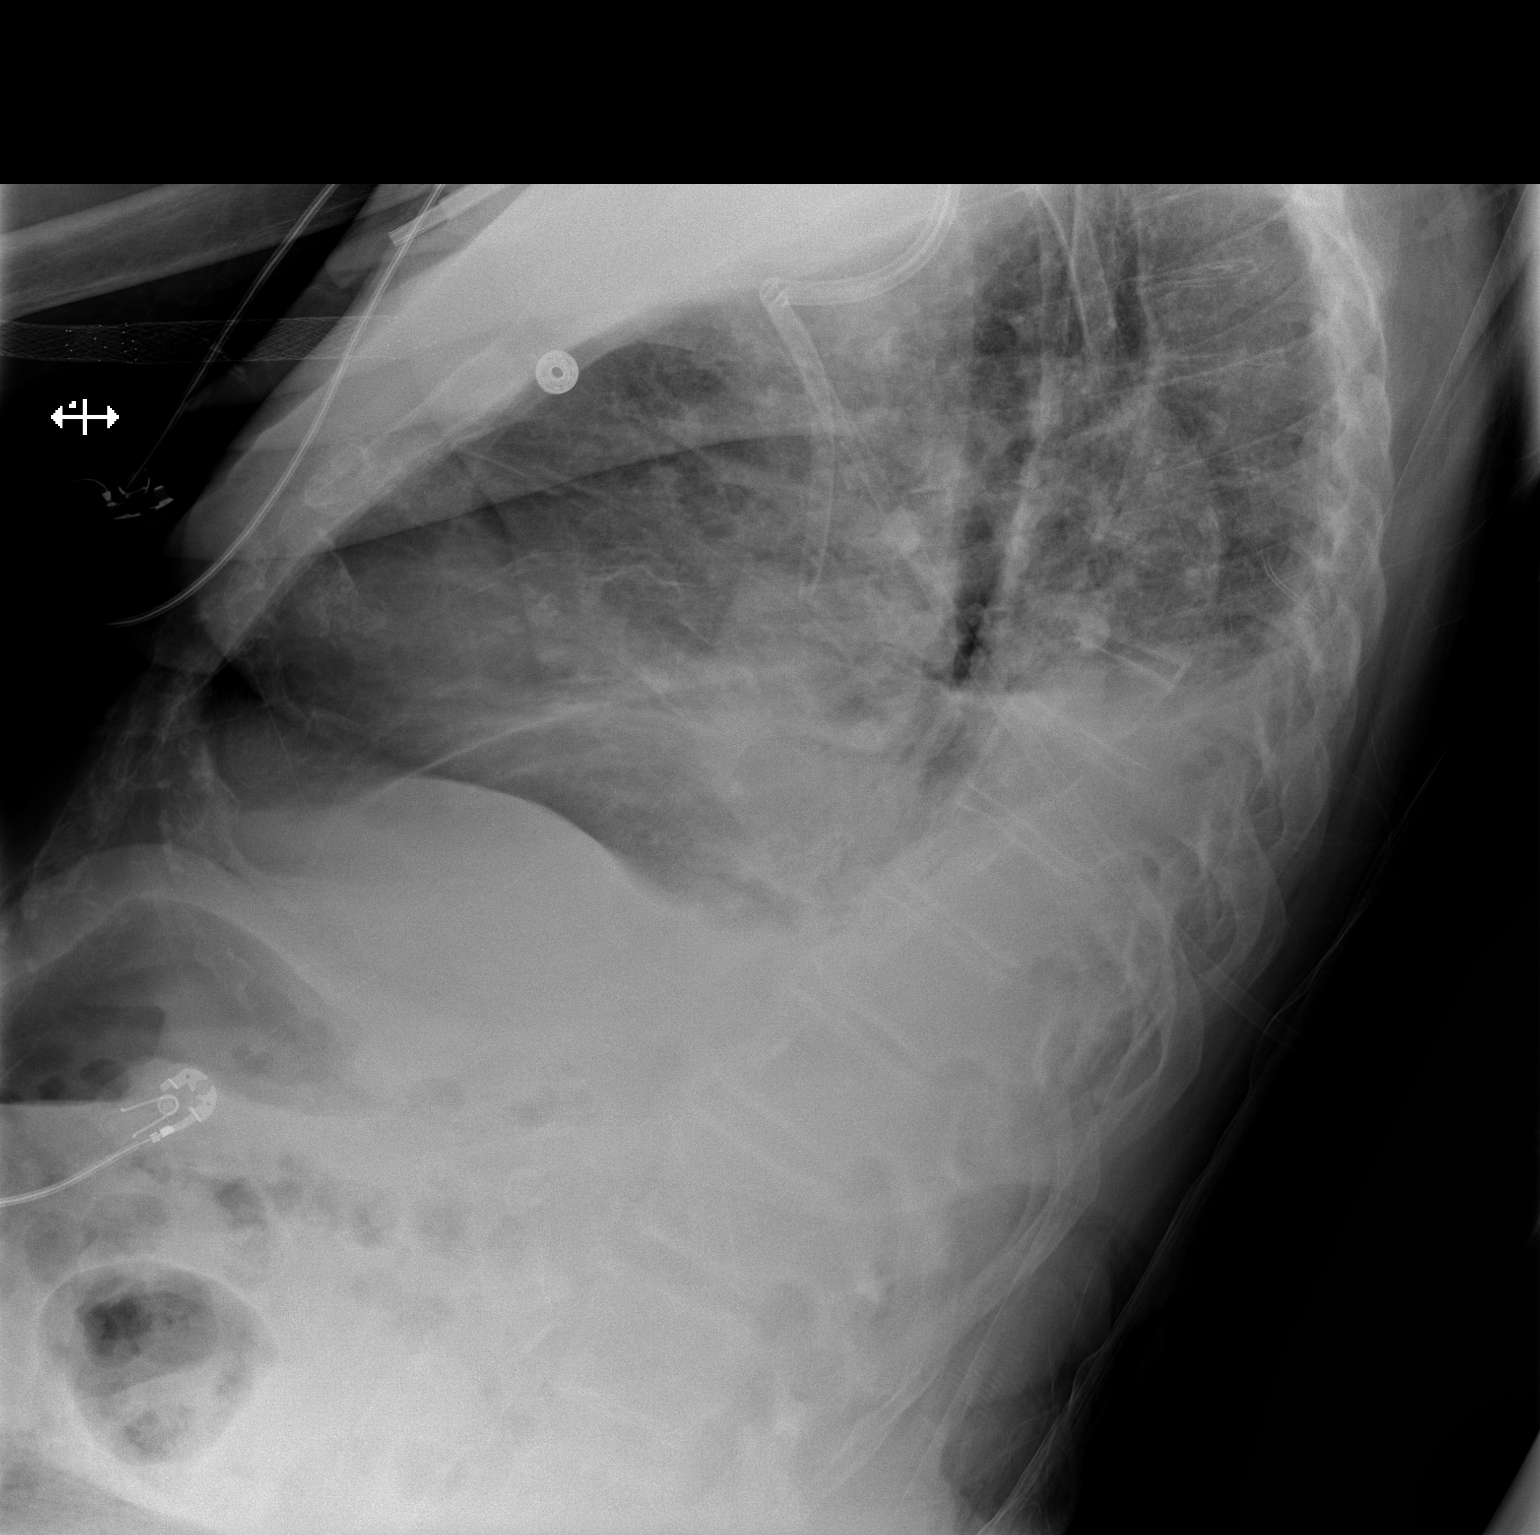

[x chest ap]
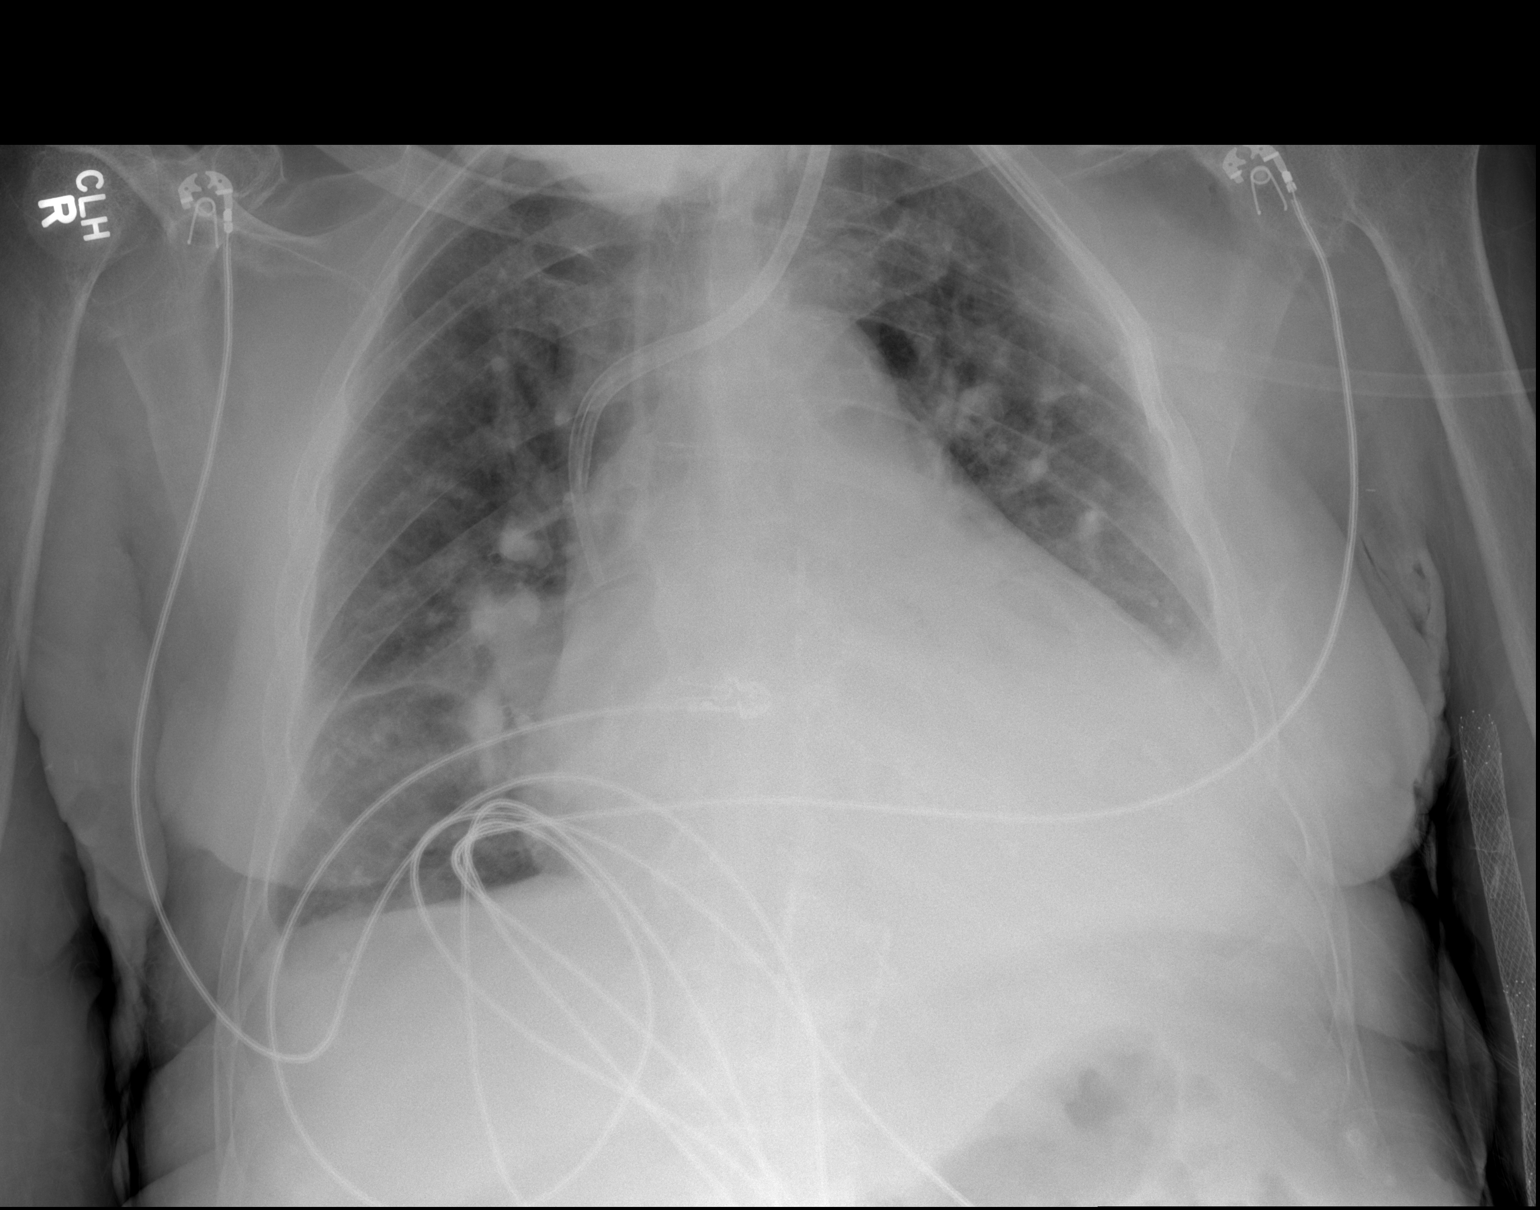

[2 of 2 positions shown; findings below may reference images not displayed]

FINDINGS: Cardiac silhouette is enlarged but stable. There are small right and
moderate left pleural effusions. Central vascular congestion is
noted, but there is no overt pulmonary edema. Dependent lower lobe
atelectasis is noted adjacent to the pleural effusions.

Left internal jugular tunneled dual lumen central venous catheter is
stable. No pneumothorax.
IMPRESSION: 1. Tunneled dual lumen left internal jugular central venous catheter
is stable and well positioned.
2. Central vascular congestion is noted, but the pulmonary edema
noted previously has resolved.
3. Moderate left and small right pleural effusion with associated
dependent atelectasis. This is stable. Stable cardiomegaly.

## 2015-02-22 IMAGING — US US ABDOMEN COMPLETE
1 series · 13 of 25 positions shown · non-contrast
Comparison: None.

CLINICAL DATA: Abdominal pain.  Elevated LFTs

EXAM:
ULTRASOUND ABDOMEN COMPLETE

[Series 1: us abdomen complete · 0.27mm/px · 13 of 65 slices shown]
[im 1/65]
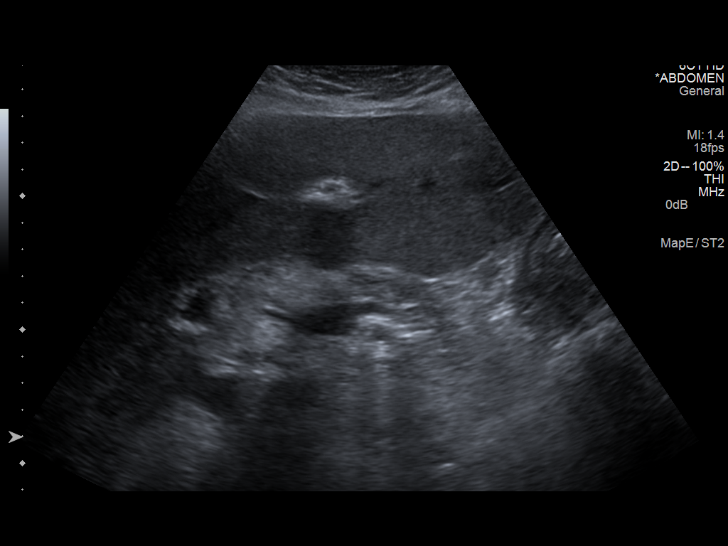
[im 6/65]
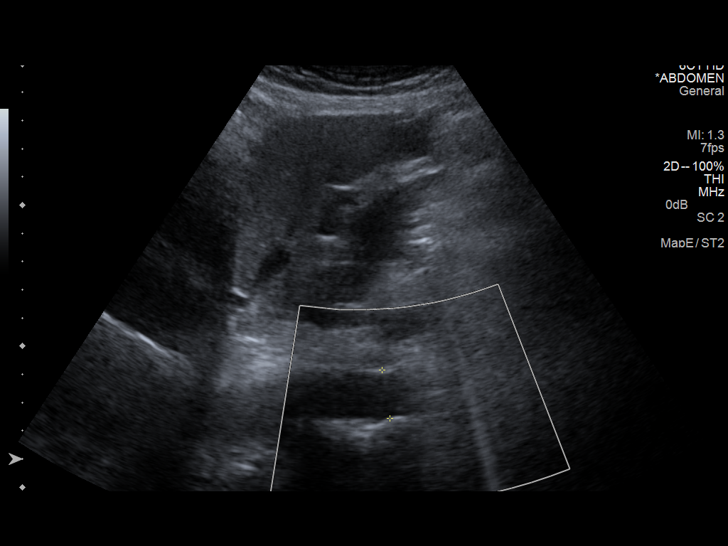
[im 11/65]
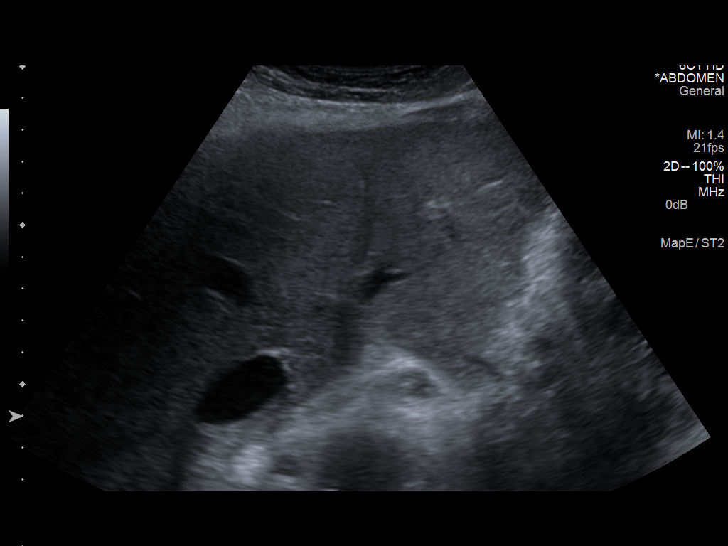
[im 17/65]
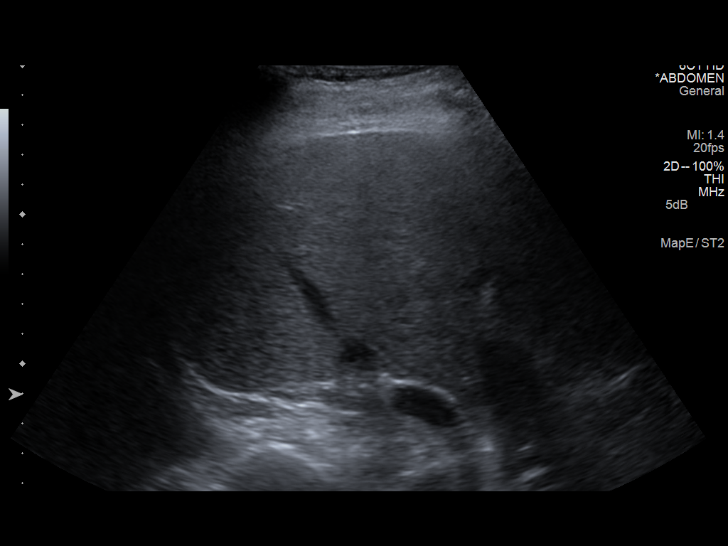
[im 22/65]
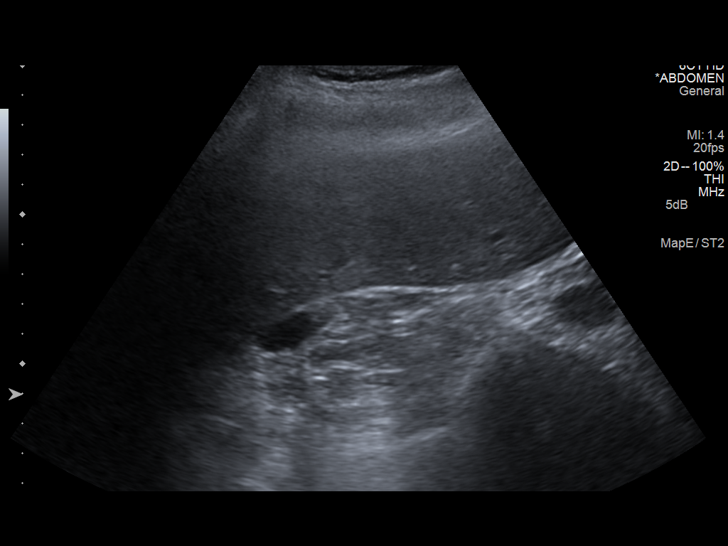
[im 27/65]
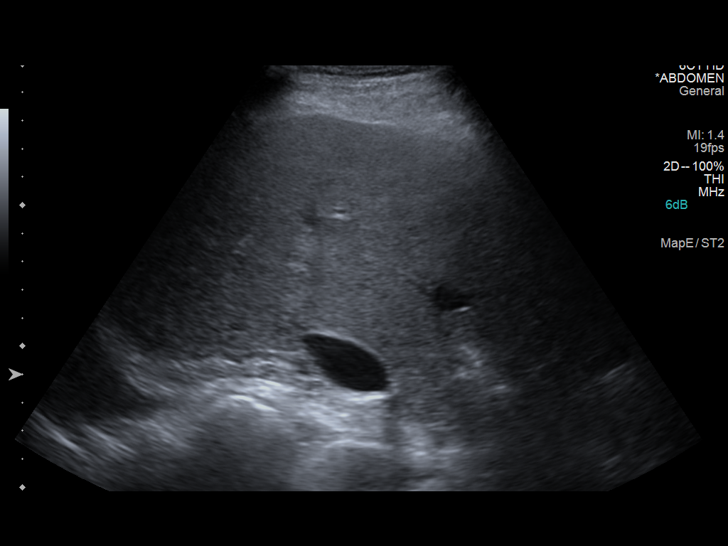
[im 33/65]
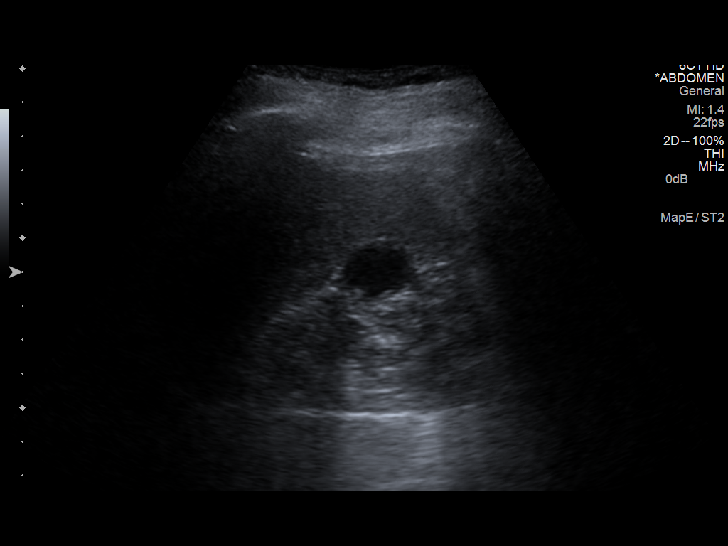
[im 38/65]
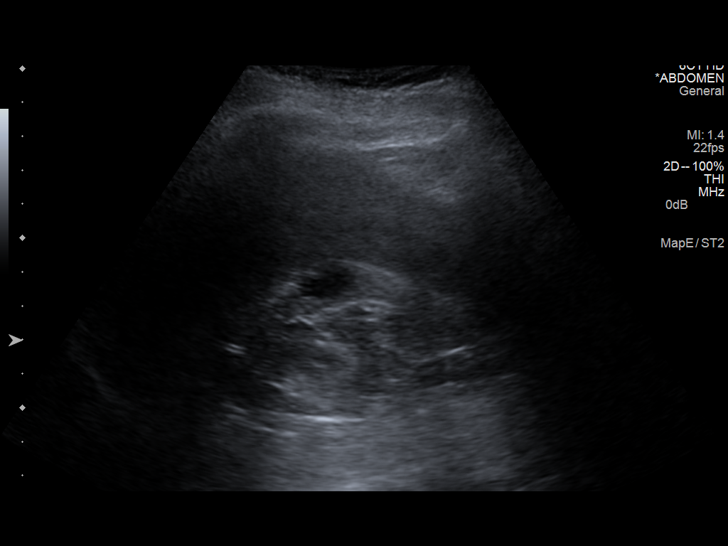
[im 43/65]
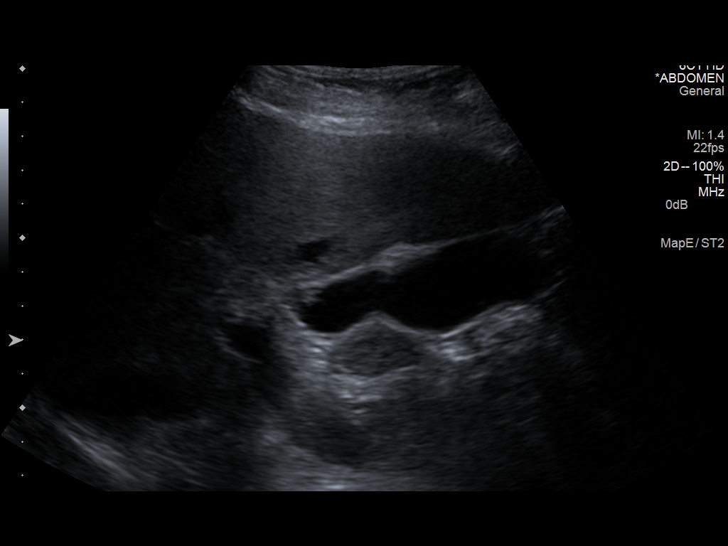
[im 49/65]
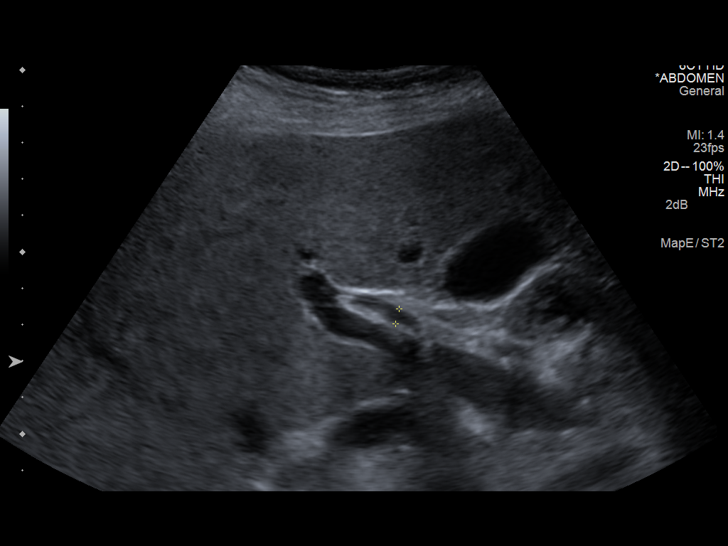
[im 54/65]
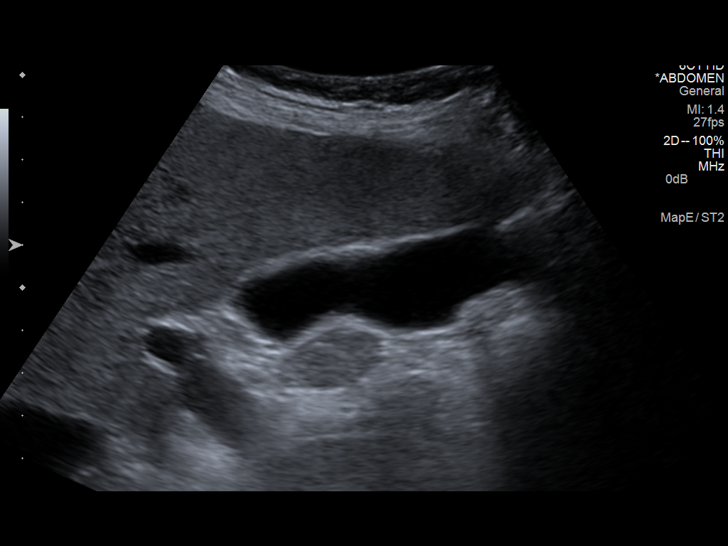
[im 59/65]
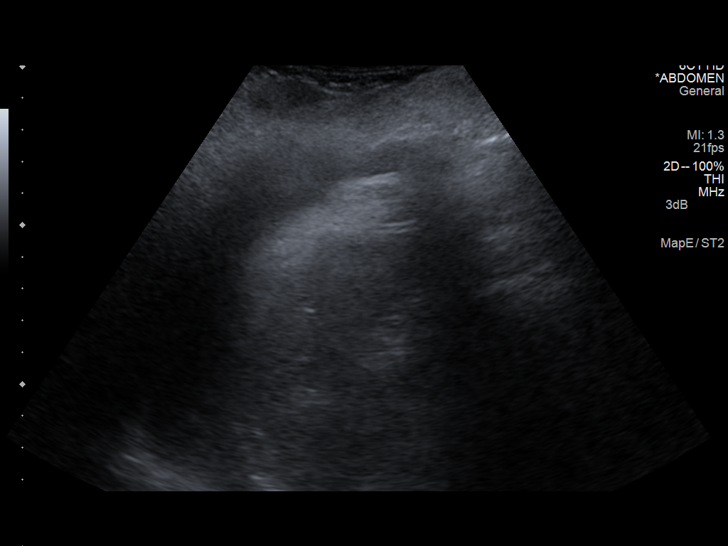
[im 65/65]
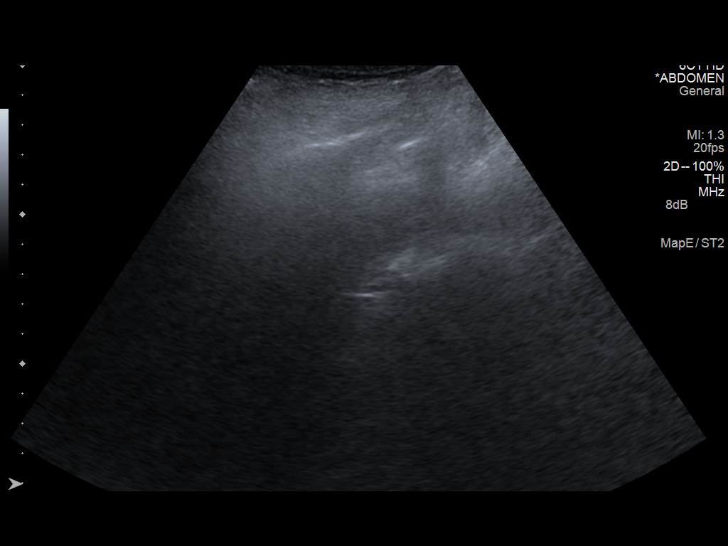

[13 of 25 positions shown; findings below may reference images not displayed]

FINDINGS: Gallbladder:

Small bowel gallstones appreciated within the gallbladder.
Gallbladder wall thickness 2.4 mm. There is no evidence of
pericholecystic fluid nor a sonographic Murphy's sign.

Common bile duct:

Diameter: 4.2 mm

Liver:

No focal lesion identified. Within normal limits in parenchymal
echogenicity. Hepatopetal flow is identified within the portal vein.

IVC:

No abnormality visualized.

Pancreas:

Visualized portion unremarkable.

Spleen:

Size and appearance within normal limits.

Right Kidney:

Length: 8.7 cm. Echogenicity within normal limits. No mass or
hydronephrosis visualized. 2 x 2 x 1.8 cm cyst posterior midpole.
There is decreased corticomedullary differentiation without evidence
of hydronephrosis, calculi, or solid masses.

Left Kidney:

Length: The left kidney was not visualized secondary to overlying
bowel gas.

Abdominal aorta:

No aneurysm visualized.

Other findings:

None.
IMPRESSION: 1. Cholelithiasis without evidence of cholecystitis
2. Benign appearing cyst right kidney
3. Renal findings consistent with the patient's history of chronic
renal disease

## 2015-02-25 IMAGING — DX DG CHEST 1V PORT
1 series · 1 of 1 positions shown · non-contrast
Comparison: Portable exam 8949 hr compared to 4657 hr

CLINICAL DATA: Post diastatic catheter placement

EXAM:
PORTABLE CHEST - 1 VIEW

[portable]
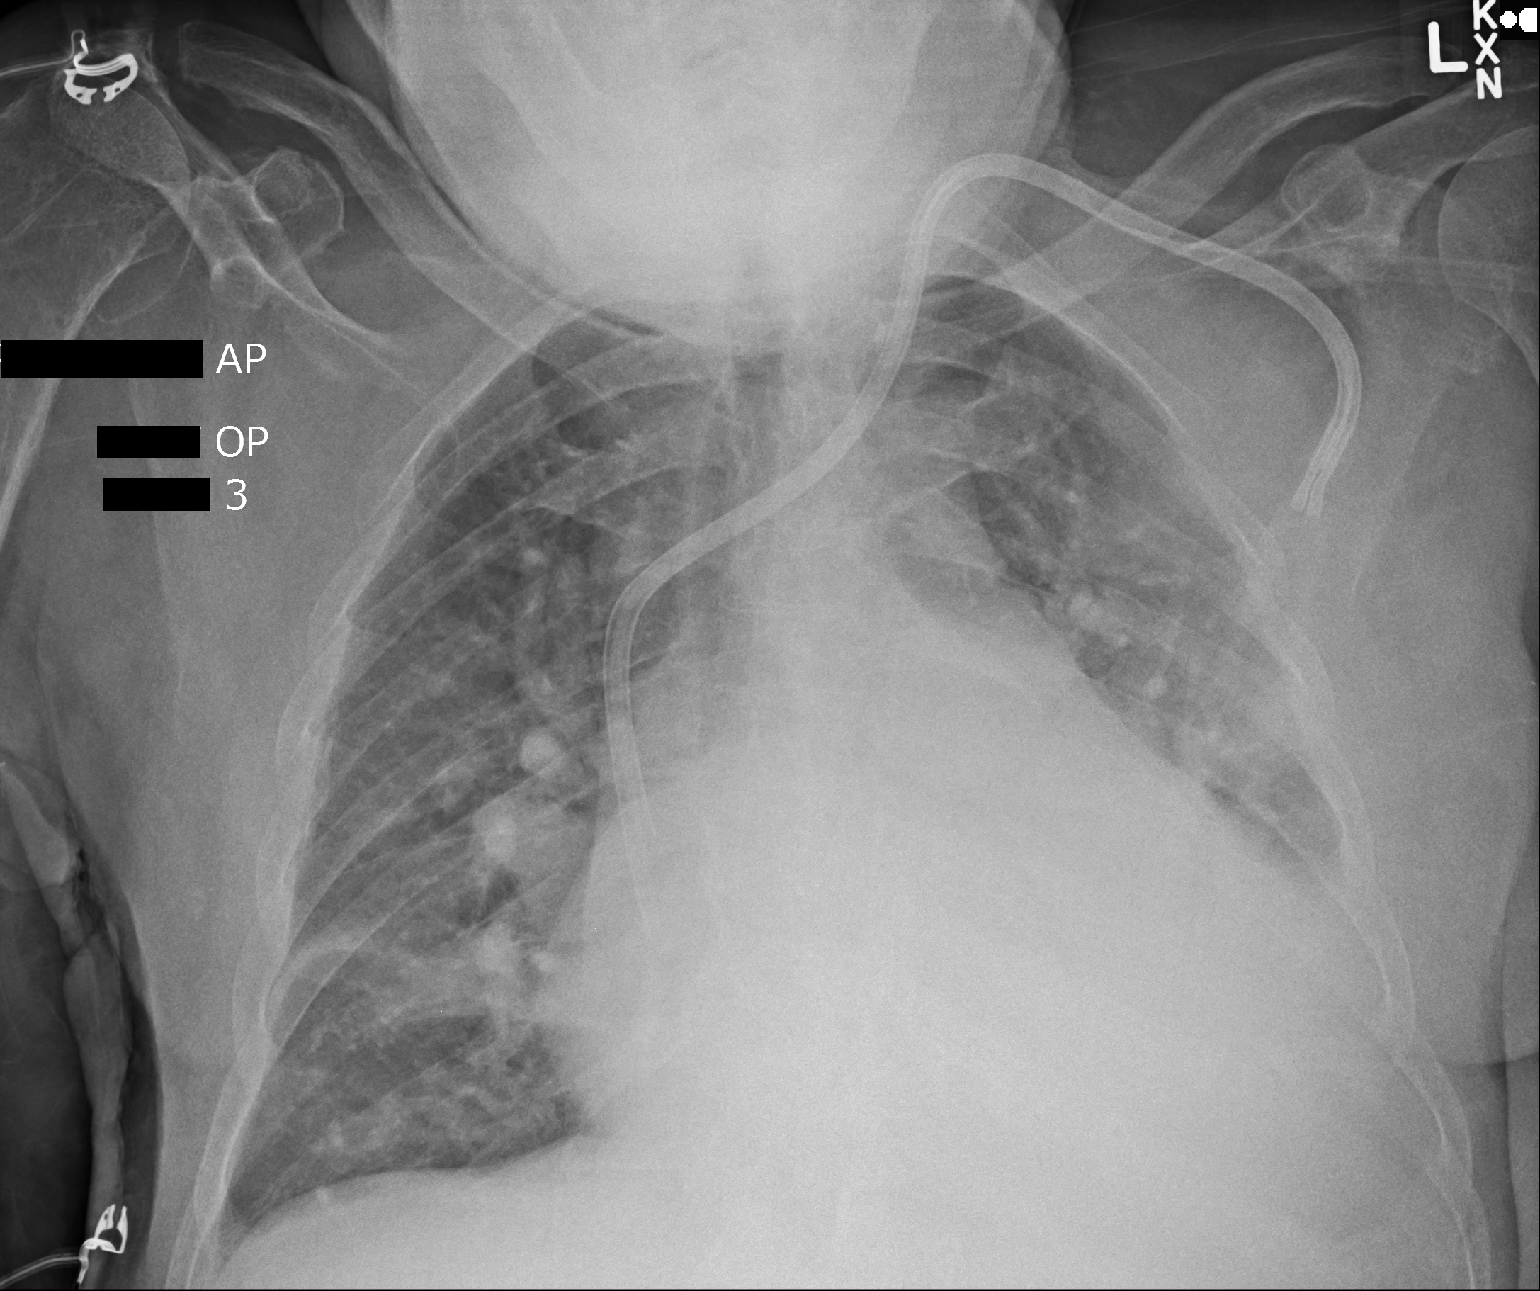

[1 of 1 positions shown; findings below may reference images not displayed]

FINDINGS: New left jugular dual-lumen central venous catheter with tip
projecting over superior right atrium.

No pneumothorax.

Enlargement of cardiac silhouette with pulmonary vascular
congestion.

Persistent left lower lobe atelectasis versus consolidation.

Minimal pulmonary edema.

Diffuse osseous demineralization.
IMPRESSION: No pneumothorax following central line placement.

CHF with persistent atelectasis versus consolidation in left lower
lobe.

## 2015-02-25 IMAGING — CR DG CHEST 1V PORT
1 series · 1 of 1 positions shown · non-contrast
Comparison: 04/30/2013 and earlier.

CLINICAL DATA: 81-year-old male with shortness of breath. Recently
missed dialysis. Initial encounter.

EXAM:
PORTABLE CHEST - 1 VIEW

[AP]
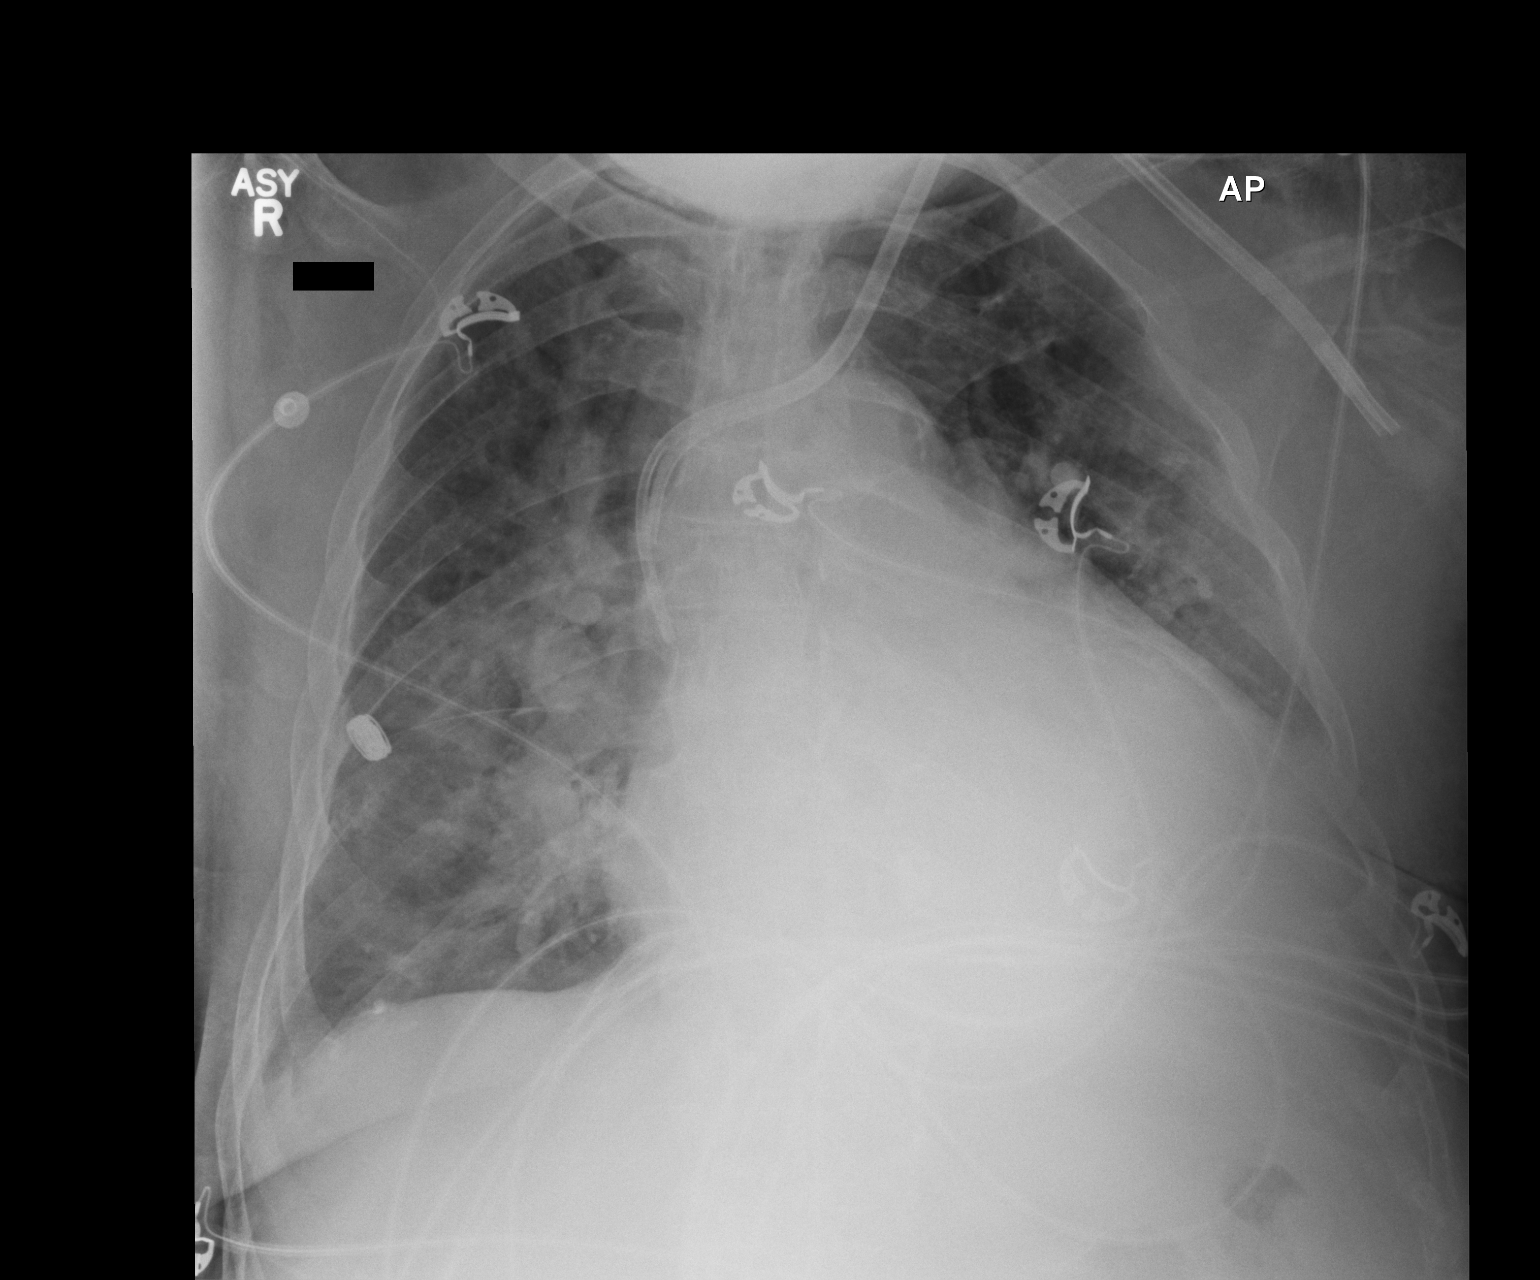

[1 of 1 positions shown; findings below may reference images not displayed]

FINDINGS: Portable AP semi upright view at 0432 hrs. Stable left chest
tunneled dual lumen dialysis catheter. Stable cardiomegaly and
mediastinal contours. Indistinct perihilar and basilar pulmonary
vasculature. Continued confluent retrocardiac opacity. Evidence of
small effusions, including fluid in the right minor fissure. No
pneumothorax.
IMPRESSION: Basilar predominant pulmonary edema with small pleural effusions and
continued confluent retrocardiac opacity - favor atelectasis.
